# Patient Record
Sex: Male | Born: 1955 | Race: White | Hispanic: No | Smoking: Former smoker
Health system: Southern US, Community
[De-identification: ages and names within clinical notes are randomized; demographics above are authoritative.]

## PROBLEM LIST (undated history)

## (undated) DIAGNOSIS — I10 Essential (primary) hypertension: Secondary | ICD-10-CM

## (undated) DIAGNOSIS — E119 Type 2 diabetes mellitus without complications: Secondary | ICD-10-CM

## (undated) DIAGNOSIS — I2699 Other pulmonary embolism without acute cor pulmonale: Secondary | ICD-10-CM

## (undated) DIAGNOSIS — N400 Enlarged prostate without lower urinary tract symptoms: Secondary | ICD-10-CM

## (undated) HISTORY — PX: KNEE SURGERY: SHX244

## (undated) HISTORY — PX: SHOULDER SURGERY: SHX246

## (undated) HISTORY — DX: Other pulmonary embolism without acute cor pulmonale: I26.99

## (undated) HISTORY — DX: Type 2 diabetes mellitus without complications: E11.9

## (undated) HISTORY — DX: Benign prostatic hyperplasia without lower urinary tract symptoms: N40.0

---

## 2010-10-13 ENCOUNTER — Emergency Department (HOSPITAL_COMMUNITY)
Admission: EM | Admit: 2010-10-13 | Discharge: 2010-10-14 | Disposition: A | Discharge status: Home / Self Care | Payer: 59 | Attending: Emergency Medicine | Admitting: Emergency Medicine

## 2010-10-13 DIAGNOSIS — S0180XA Unspecified open wound of other part of head, initial encounter: Secondary | ICD-10-CM | POA: Insufficient documentation

## 2010-10-13 DIAGNOSIS — R05 Cough: Secondary | ICD-10-CM | POA: Insufficient documentation

## 2010-10-13 DIAGNOSIS — W19XXXA Unspecified fall, initial encounter: Secondary | ICD-10-CM | POA: Insufficient documentation

## 2010-10-13 DIAGNOSIS — E1169 Type 2 diabetes mellitus with other specified complication: Secondary | ICD-10-CM | POA: Insufficient documentation

## 2010-10-13 DIAGNOSIS — R059 Cough, unspecified: Secondary | ICD-10-CM | POA: Insufficient documentation

## 2010-11-16 ENCOUNTER — Emergency Department (HOSPITAL_COMMUNITY): Payer: 59

## 2010-11-16 ENCOUNTER — Inpatient Hospital Stay (HOSPITAL_COMMUNITY)
Admission: EM | Admit: 2010-11-16 | Discharge: 2010-11-18 | DRG: 638 | Disposition: A | Discharge status: Home / Self Care | Payer: 59 | Attending: Internal Medicine | Admitting: Internal Medicine

## 2010-11-16 DIAGNOSIS — R030 Elevated blood-pressure reading, without diagnosis of hypertension: Secondary | ICD-10-CM | POA: Diagnosis present

## 2010-11-16 DIAGNOSIS — E871 Hypo-osmolality and hyponatremia: Secondary | ICD-10-CM | POA: Diagnosis present

## 2010-11-16 DIAGNOSIS — E875 Hyperkalemia: Secondary | ICD-10-CM | POA: Diagnosis present

## 2010-11-16 DIAGNOSIS — E1310 Other specified diabetes mellitus with ketoacidosis without coma: Principal | ICD-10-CM | POA: Diagnosis present

## 2010-11-16 DIAGNOSIS — F17200 Nicotine dependence, unspecified, uncomplicated: Secondary | ICD-10-CM | POA: Diagnosis present

## 2010-11-16 DIAGNOSIS — Z794 Long term (current) use of insulin: Secondary | ICD-10-CM

## 2010-11-16 LAB — CARDIAC PANEL(CRET KIN+CKTOT+MB+TROPI)
CK, MB: 2.9 ng/mL (ref 0.3–4.0)
CK, MB: 2.5 ng/mL (ref 0.3–4.0)
Relative Index: INVALID (ref 0.0–2.5)
Relative Index: INVALID (ref 0.0–2.5)
Total CK: 28 U/L (ref 7–232)
Total CK: 22 U/L (ref 7–232)
Troponin I: <0.3 ng/mL (ref <0.30)
Troponin I: <0.3 ng/mL (ref <0.30)

## 2010-11-16 LAB — BLOOD GAS, ARTERIAL
Acid-base deficit: 10.8 mmol/L — ABNORMAL HIGH (ref 0.0–2.0)
Bicarbonate: 5.5 mEq/L — ABNORMAL LOW (ref 20.0–24.0)
Bicarbonate: 14.2 mEq/L — ABNORMAL LOW (ref 20.0–24.0)
FIO2: 0.21 %
O2 Content: 2 L/min
O2 Saturation: 98.9 %
Patient temperature: 37
TCO2: 13.1 mmol/L (ref 0–100)
pCO2 arterial: 28.9 mmHg — ABNORMAL LOW (ref 35.0–45.0)
pH, Arterial: 7.168 — CL (ref 7.350–7.450)
pH, Arterial: 7.312 — ABNORMAL LOW (ref 7.350–7.450)
pO2, Arterial: 154 mmHg — ABNORMAL HIGH (ref 80.0–100.0)
pO2, Arterial: 106 mmHg — ABNORMAL HIGH (ref 80.0–100.0)

## 2010-11-16 LAB — DIFFERENTIAL
Basophils Absolute: 0 10*3/uL (ref 0.0–0.1)
Eosinophils Absolute: 0 10*3/uL (ref 0.0–0.7)
Eosinophils Relative: 0 % (ref 0–5)
Lymphocytes Relative: 10 % — ABNORMAL LOW (ref 12–46)
Lymphs Abs: 1.4 10*3/uL (ref 0.7–4.0)
Neutrophils Relative %: 85 % — ABNORMAL HIGH (ref 43–77)

## 2010-11-16 LAB — GLUCOSE, CAPILLARY
Glucose-Capillary: 430 mg/dL — ABNORMAL HIGH (ref 70–99)
Glucose-Capillary: 418 mg/dL — ABNORMAL HIGH (ref 70–99)
Glucose-Capillary: 314 mg/dL — ABNORMAL HIGH (ref 70–99)
Glucose-Capillary: 283 mg/dL — ABNORMAL HIGH (ref 70–99)
Glucose-Capillary: 249 mg/dL — ABNORMAL HIGH (ref 70–99)
Glucose-Capillary: 227 mg/dL — ABNORMAL HIGH (ref 70–99)
Glucose-Capillary: 228 mg/dL — ABNORMAL HIGH (ref 70–99)
Glucose-Capillary: 164 mg/dL — ABNORMAL HIGH (ref 70–99)
Glucose-Capillary: 148 mg/dL — ABNORMAL HIGH (ref 70–99)
Glucose-Capillary: 118 mg/dL — ABNORMAL HIGH (ref 70–99)

## 2010-11-16 LAB — BASIC METABOLIC PANEL
BUN: 13 mg/dL (ref 6–23)
CO2: 8 mEq/L — CL (ref 19–32)
CO2: 19 mEq/L (ref 19–32)
Calcium: 8.4 mg/dL (ref 8.4–10.5)
Chloride: 85 mEq/L — ABNORMAL LOW (ref 96–112)
Creatinine, Ser: 1.22 mg/dL (ref 0.50–1.35)
GFR calc Af Amer: >60 mL/min (ref >60)
GFR calc non Af Amer: >60 mL/min (ref >60)
Glucose, Bld: 130 mg/dL — ABNORMAL HIGH (ref 70–99)
Potassium: 5.5 mEq/L — ABNORMAL HIGH (ref 3.5–5.1)
Potassium: 3.2 mEq/L — ABNORMAL LOW (ref 3.5–5.1)

## 2010-11-16 LAB — CBC
HCT: 43.2 % (ref 39.0–52.0)
MCV: 91.1 fL (ref 78.0–100.0)
Platelets: 350 10*3/uL (ref 150–400)
RBC: 4.74 MIL/uL (ref 4.22–5.81)
RDW: 12.3 % (ref 11.5–15.5)
WBC: 15 10*3/uL — ABNORMAL HIGH (ref 4.0–10.5)

## 2010-11-16 LAB — MRSA PCR SCREENING: MRSA by PCR: NEGATIVE

## 2010-11-16 LAB — URINALYSIS, ROUTINE W REFLEX MICROSCOPIC
Glucose, UA: >1000 mg/dL — AB
Hgb urine dipstick: NEGATIVE
Ketones, ur: >80 mg/dL — AB
pH: 5.5 (ref 5.0–8.0)

## 2010-11-16 LAB — MAGNESIUM: Magnesium: 1.9 mg/dL (ref 1.5–2.5)

## 2010-11-16 LAB — URINE MICROSCOPIC-ADD ON

## 2010-11-17 LAB — BLOOD GAS, ARTERIAL
Acid-base deficit: 5.7 mmol/L — ABNORMAL HIGH (ref 0.0–2.0)
Bicarbonate: 18.5 mEq/L — ABNORMAL LOW (ref 20.0–24.0)
Drawn by: 21310
O2 Saturation: 98.4 %
Patient temperature: 37
TCO2: 16.7 mmol/L (ref 0–100)
pCO2 arterial: 32.6 mmHg — ABNORMAL LOW (ref 35.0–45.0)
pH, Arterial: 7.373 (ref 7.350–7.450)
pO2, Arterial: 103 mmHg — ABNORMAL HIGH (ref 80.0–100.0)

## 2010-11-17 LAB — GLUCOSE, CAPILLARY: Glucose-Capillary: 250 mg/dL — ABNORMAL HIGH (ref 70–99)

## 2010-11-17 LAB — BASIC METABOLIC PANEL
BUN: 10 mg/dL (ref 6–23)
CO2: 21 mEq/L (ref 19–32)
Calcium: 8.5 mg/dL (ref 8.4–10.5)
Chloride: 99 mEq/L (ref 96–112)
Creatinine, Ser: 0.57 mg/dL (ref 0.50–1.35)
GFR calc Af Amer: >60 mL/min (ref >60)
GFR calc non Af Amer: >60 mL/min (ref >60)
Glucose, Bld: 181 mg/dL — ABNORMAL HIGH (ref 70–99)
Potassium: 3.5 mEq/L (ref 3.5–5.1)
Sodium: 129 mEq/L — ABNORMAL LOW (ref 135–145)

## 2010-11-17 LAB — HEMOGLOBIN A1C
Hgb A1c MFr Bld: 11.5 % — ABNORMAL HIGH (ref <5.7)
Mean Plasma Glucose: 283 mg/dL — ABNORMAL HIGH (ref <117)

## 2010-11-17 LAB — CARDIAC PANEL(CRET KIN+CKTOT+MB+TROPI)
CK, MB: 2.1 ng/mL (ref 0.3–4.0)
Relative Index: INVALID (ref 0.0–2.5)
Total CK: 20 U/L (ref 7–232)
Troponin I: <0.3 ng/mL (ref <0.30)

## 2010-11-17 LAB — TSH: TSH: 1.005 u[IU]/mL (ref 0.350–4.500)

## 2010-11-18 LAB — BASIC METABOLIC PANEL
BUN: 4 mg/dL — ABNORMAL LOW (ref 6–23)
CO2: 26 mEq/L (ref 19–32)
Calcium: 8.3 mg/dL — ABNORMAL LOW (ref 8.4–10.5)
Chloride: 99 mEq/L (ref 96–112)
Creatinine, Ser: 0.47 mg/dL — ABNORMAL LOW (ref 0.50–1.35)
GFR calc Af Amer: >60 mL/min (ref >60)
GFR calc non Af Amer: >60 mL/min (ref >60)
Glucose, Bld: 320 mg/dL — ABNORMAL HIGH (ref 70–99)
Potassium: 3.6 mEq/L (ref 3.5–5.1)
Sodium: 132 mEq/L — ABNORMAL LOW (ref 135–145)

## 2010-11-18 LAB — CBC
HCT: 33.8 % — ABNORMAL LOW (ref 39.0–52.0)
Hemoglobin: 12.1 g/dL — ABNORMAL LOW (ref 13.0–17.0)
MCH: 31.7 pg (ref 26.0–34.0)
MCHC: 35.8 g/dL (ref 30.0–36.0)
MCV: 88.5 fL (ref 78.0–100.0)
Platelets: 224 10*3/uL (ref 150–400)
RBC: 3.82 MIL/uL — ABNORMAL LOW (ref 4.22–5.81)
RDW: 12.2 % (ref 11.5–15.5)
WBC: 7.5 10*3/uL (ref 4.0–10.5)

## 2010-11-18 LAB — GLUCOSE, CAPILLARY: Glucose-Capillary: 335 mg/dL — ABNORMAL HIGH (ref 70–99)

## 2010-11-21 LAB — CULTURE, BLOOD (ROUTINE X 2)
Culture: NO GROWTH
Culture: NO GROWTH

## 2010-11-26 NOTE — H&P (Signed)
NAME:  Daniel Campos, Daniel Campos NO.:  [id number]  MEDICAL RECORD NO.:  [id number]  LOCATION:  IC04                          FACILITY:  [facility]  PHYSICIAN:  Celso Amy, MD   DATE OF BIRTH:  [birth year]  DATE OF ADMISSION:  11/16/2010 DATE OF DISCHARGE:  LH                             HISTORY & PHYSICAL   PRIMARY CARE PHYSICIAN:  The patient does not have a primary care.  The patient moved from [place] 4-5 years ago but has not got any primary care yet.  CHIEF COMPLAINT:  Sugar is not controlled.  HISTORY OF PRESENT ILLNESS:  The patient is a 55 year old white male with a past medical history of diabetes type 2 who presented to the ER with chief complaint of uncontrolled sugar.  History of present illness dates back to past Friday when the patient started having nausea and vomiting.  The patient was having nausea on regular basis.  The patient was not able to keep anything down.  The patient was started havingemesis last night.  The patient had 3-4 episodes of emesis.  The patient was not giving himself insulin and he was not eating and was scared that his glucose will go low.  The patient only injected himself once with insulin past 3-4 days.  The patient complains of increased thirst.  The patient offers no complaint of blood in emesis.  No complaint of diarrhea or abdominal pain.  The patient has no complaint of chest pain. The patient complains of shortness of breath on exertion.  No complaint of syncope or seizures.  No complaint of speech difficulty or leg or arm weakness.  No complaint of numbness.  No complaint of fever or cough. The patient does complaint of occasional chills.  The patient complains of dizziness on postural change.  ALLERGIES:  The patient has no known drug allergies.  SOCIAL HISTORY:  The patient is a smoker with more than 40-pack years of smoking.  The patient drinks alcohol occasionally.  FAMILY HISTORY:  The patient has a strong family  history of diabetes in his siblings.  PAST MEDICAL HISTORY:  Positive for diabetes mellitus type 2.  REVIEW OF SYSTEMS:  Positive for chills.  MEDICATIONS:  The patient takes Humulin 70/30, 10-12 units twice daily.  PHYSICAL EXAMINATION:  VITAL SIGNS:  Lying blood pressure 149/91, on standing up blood pressure is 87/61.  Pulse 120, respiratory rate 22, and temperature 97.4. GENERAL:  The patient is awake, alert, oriented to time, place, and person, think-built, resting comfortably on the bed. HEENT:  Pupils equally reactive to light and accommodation.  Extraocular movement is intact.  Head is atraumatic and normocephalic. NECK:  Supple. RESPIRATORY:  No acute respiratory distress.  Chest is clear to auscultation bilaterally. CARDIOVASCULAR:  S1-S2 is regular in rate and rhythm.  No murmurs were appreciated. GI:  Deep bowel sounds present.  Abdomen soft, nontender, and nondistended. EXTREMITIES:  No lower extremity edema or cyanosis was seen. CNS:  Cranial nerves II-XII are grossly intact.  The patient moves both upper and lower extremities with 5/5 strength. PSYCHIATRIC:  The patient has normal mood and affect. SKIN:  The patient has abrasions on the right  foot over the scapular region.  LABORATORY DATA:  Sodium 125, potassium 5.5, serum chloride 85, bicarb 8, BUN 22, serum creatinine 1.2, and glucose 511.  The patient's WBC is 15, the patient's hemoglobin is 15, and platelets 350.  The patient's anion gap is 125 minus 93 which comes out to be 32.  The patient's delta anion gap is 32 minus 12 which is 20.  The patient's delta bicarb is 24- 8 which is 16.  The patient expected PC2 is 20+ minus 2.  The patient's ABG shows pH of 7.16 and pCO2 of 15.  Chest x-ray shows signs of COPD. Postop EKG shows normal sinus rhythm with sinus tachycardia.  IMPRESSION: 1. Endocrinology:  The patient is being admitted with diabetic     ketoacidosis.  The patient is on IV insulin and IV  fluids. 2. Acid base:  The patient has high anion gap acidosis because of     diabetic ketoacidosis.  The patient also has respiratory alkalosis     secondary to inflammatory state. 3. Cardiovascular:  The patient is hemodynamically fragile.  The     patient is orthostatic but no need of pressors yet. 4. Fluid electrolyte nutrition:  The patient is hypovolemic. 5. The patient is hyponatremic:  Hyponatremia is because of     hyperglycemia as creatinine for high glucose.  The sodium comes to     normal. 6. Hyperkalemia:  The patient's hyperkalemia is because of lack of     insulin because of diabetic ketoacidosis. 7. Psych:  The patient has a history of tobacco abuse. 8. Deep venous thrombosis:  We will keep the patient on deep venous     thrombosis prophylaxis.  PLAN: 1. We will admit to ICU. 2. We will cycle tropes. 3. We will repeat ABG now in the a.m. to see where his pH is. 4. We will keep the patient on IV insulin. 5. We will keep the patient on IV fluids. 6. We will follow glucose stabilizer protocol. 7. The patient's further course depends on how the patient does with     this admission.     Celso Amy, MD    MB/MEDQ  D:  11/16/2010  T:  11/17/2010  Job:  [id number]  Electronically Signed by Celso Amy M.D. on 11/26/2010 01:17:07 PM

## 2010-12-02 NOTE — Discharge Summary (Signed)
NAMEMarland Kitchen  Daniel Campos, Daniel Campos NO.:  [id number]  MEDICAL RECORD NO.:  [id number]  LOCATION:  A304                          FACILITY:  [facility]  PHYSICIAN:  Peggye Pitt, M.D. DATE OF BIRTH:  09-13-1955  DATE OF ADMISSION:  11/16/2010 DATE OF DISCHARGE:  07/04/2012LH                              DISCHARGE SUMMARY   PRIMARY CARE PHYSICIAN:  He is currently unassigned.  DISCHARGE DIAGNOSES: 1. Insulin-dependent diabetes mellitus with diabetic ketoacidosis this     admission, resolved. 2. Elevated blood pressure, without diagnosis of hypertension. 3. High anion gap metabolic acidosis secondary to diabetic     ketoacidosis. 4. Hyperkalemia, resolved, secondary to acid base disturbance.  DISCHARGE MEDICATIONS:  Humulin insulin 70/30 to take 18 units in the morning before breakfast and 15 units in the evening right before dinner.  DISPOSITION AND FOLLOWUP:  Daniel Campos will be discharged home today in stable and improved condition.  We have asked Case Management to assist him with a referral to a new primary care physician in the area.  He is instructed to take his CBGs 3 times a day before each meal and to bring in his log to his next doctor's visit for insulin titration.  He is instructed to hold his next dose of insulin and to eat something high in a carbon count if his sugar is below 70.  CONSULTATION THIS HOSPITALIZATION:  None.  IMAGES AND PROCEDURES:  A chest x-ray on November 16, 2010, that showed changes of COPD with no acute findings.  HISTORY AND PHYSICAL:  For full details, please refer to dictation on November 16, 2010, by Dr. Wolfgang Phoenix, but in brief Daniel Campos is a very pleasant 55 year old gentleman who has a history of insulin-dependent diabetes mellitus who has been in the area for about 5 years.  However, he has not had any primary care followup who presented because his sugars were not controlled.  Approximately 5 days prior to admission, he started  experiencing nausea on a regular basis, was not able to eat. Because of this, he was cutting down his insulin dose as to not make his sugars become very low.  He complained of polydipsia.  In the emergency department, he was found to have a very high sugar with a high anion gap.  We were asked to admit him for further evaluation and management.  HOSPITAL COURSE BY PROBLEM: 1. Diabetic ketoacidosis.  He was initially placed in the Step-Down     Unit on IV insulin and fluids.  He was aggressively fluid     resuscitated.  He has now been taken off the insulin drip and has     been transitioned back to his home regimen of insulin 70/30.  It is     noted that he was taking 10-12 units twice daily at home.  I have     increased his 70/30 to 18 units in the morning and 15 units at     night.  He will need close followup with his primary care physician     for further insulin titration if needed.  He has been seen by     nutritionist for diabetic diet education and  will also be referred     for outpatient diabetes education. 2. Elevated blood pressure without diagnosis of hypertension.  It is     noted during his hospital stay that his blood pressure has been     between 140-160 systolic.  This is certainly above goal for a     diabetic in whom we would like to keep blood pressure underneath     130/80.  At this point, I will refrain from adding any     antihypertensive medications as I am unsure of what his baseline     blood pressure is, however, close followup in the outpatient     setting is recommended to institute antihypertensive therapy if     needed.  If he needs any antihypertensive agent, I recommend an ACE     inhibitor for his renal protective function.  VITAL SIGNS ON DAY OF DISCHARGE:  Blood pressure 146/88, heart rate 98, respirations 16, sats of 100% on room air, and temp 98.7.     Peggye Pitt, M.D.     EH/MEDQ  D:  11/18/2010  T:  11/18/2010  Job:   [id number]  Electronically Signed by Peggye Pitt M.D. on 12/02/2010 07:38:44 AM

## 2012-08-16 ENCOUNTER — Encounter: Payer: Self-pay | Admitting: Family Medicine

## 2012-08-16 ENCOUNTER — Ambulatory Visit (INDEPENDENT_AMBULATORY_CARE_PROVIDER_SITE_OTHER): Payer: 59 | Admitting: Family Medicine

## 2012-08-16 VITALS — BP 122/78 | Temp 97.6°F | Wt 143.0 lb

## 2012-08-16 DIAGNOSIS — E119 Type 2 diabetes mellitus without complications: Secondary | ICD-10-CM

## 2012-08-16 DIAGNOSIS — J209 Acute bronchitis, unspecified: Secondary | ICD-10-CM

## 2012-08-16 DIAGNOSIS — E1151 Type 2 diabetes mellitus with diabetic peripheral angiopathy without gangrene: Secondary | ICD-10-CM | POA: Insufficient documentation

## 2012-08-16 DIAGNOSIS — N400 Enlarged prostate without lower urinary tract symptoms: Secondary | ICD-10-CM

## 2012-08-16 MED ORDER — CLARITHROMYCIN 500 MG PO TABS: 500.0000 mg | ORAL_TABLET | Freq: Two times a day (BID) | ORAL | Status: AC

## 2012-08-16 NOTE — Progress Notes (Signed)
  Subjective:    Patient ID: Daniel Campos, male    DOB: 12-17-1955, 57 y.o.   MRN: [id number]  Cough The current episode started in the past 7 days. The problem has been gradually worsening. The problem occurs every few minutes. The cough is productive of sputum. Associated symptoms include ear congestion, nasal congestion, rhinorrhea and shortness of breath. Pertinent negatives include no chest pain or fever. Nothing aggravates the symptoms.  Shortness of Breath Associated symptoms include rhinorrhea. Pertinent negatives include no chest pain or fever.      Review of Systems  Constitutional: Negative for fever.  HENT: Positive for rhinorrhea.   Respiratory: Positive for cough and shortness of breath.   Cardiovascular: Negative for chest pain.       Objective:   Physical Exam  Alert hydration good. Vitals reviewed. HEENT moderate nasal congestion. Some frontal tenderness. Pharynx erythematous. Lungs rare rhonchi no true wheezes. Heart regular in rhythm.      Assessment & Plan:  Impression rhinitis bronchitis. Underlying diabetes with fair control of sugar today. Plan as per orders

## 2012-08-16 NOTE — Patient Instructions (Signed)
Take all the antibiotics 

## 2012-10-16 ENCOUNTER — Encounter: Payer: Self-pay | Admitting: Family Medicine

## 2012-10-16 ENCOUNTER — Ambulatory Visit (INDEPENDENT_AMBULATORY_CARE_PROVIDER_SITE_OTHER): Payer: 59 | Admitting: Family Medicine

## 2012-10-16 VITALS — BP 132/88 | Temp 97.7°F

## 2012-10-16 DIAGNOSIS — E119 Type 2 diabetes mellitus without complications: Secondary | ICD-10-CM

## 2012-10-16 DIAGNOSIS — J209 Acute bronchitis, unspecified: Secondary | ICD-10-CM

## 2012-10-16 DIAGNOSIS — I10 Essential (primary) hypertension: Secondary | ICD-10-CM

## 2012-10-16 MED ORDER — LEVOFLOXACIN 500 MG PO TABS: 500.0000 mg | ORAL_TABLET | Freq: Every day | ORAL | Status: AC

## 2012-10-16 NOTE — Patient Instructions (Addendum)
46 units in the morn and 24 in the eve (increase your insulin)  Call us in two-three weeks with some fasting numbers

## 2012-10-16 NOTE — Progress Notes (Signed)
  Subjective:    Patient ID: Daniel Campos, male    DOB: Oct 27, 1955, 57 y.o.   MRN: [id number]  Cough This is a recurrent problem. The current episode started 1 to 4 weeks ago. The problem has been gradually worsening. The problem occurs every few minutes. The cough is productive of purulent sputum. Associated symptoms include nasal congestion and rhinorrhea. Pertinent negatives include no chest pain, chills, ear congestion, myalgias or shortness of breath. Associated symptoms comments: Decreased energy level. Risk factors for lung disease include smoking/tobacco exposure. He has tried nothing for the symptoms. The treatment provided mild relief.  Diabetes He has type 1 diabetes mellitus. His disease course has been improving. There are no hypoglycemic associated symptoms. Pertinent negatives for diabetes include no chest pain. Symptoms are improving. Risk factors for coronary artery disease include diabetes mellitus and hypertension. He is compliant with treatment all of the time. He is following a generally healthy diet. Meal planning includes avoidance of concentrated sweets. He has not had a previous visit with a dietician. He participates in exercise daily. His home blood glucose trend is decreasing steadily. His breakfast blood glucose range is generally 110-130 mg/dl. An ACE inhibitor/angiotensin II receptor blocker is being taken. Eye exam is current.   Eye surg recently sec to blood vessel issues from diabetes  Results for orders placed in visit on 10/16/12  POCT GLYCOSYLATED HEMOGLOBIN (HGB A1C)      Result Value Range   Hemoglobin A1C 10.4      Review of Systems  Constitutional: Negative for chills.  HENT: Positive for rhinorrhea.   Respiratory: Positive for cough. Negative for shortness of breath.   Cardiovascular: Negative for chest pain.  Musculoskeletal: Negative for myalgias.   ROS otherwise negative    Objective:   Physical Exam  Alert HEENT normal. Moderate nasal  congestion. Pharynx normal neck supple. Vitals reviewed. Lungs no wheezes or crackles heart regular in rhythm. Abdomen benign. Feet pulses good sensation intact no edema.     Assessment & Plan:  Impression 1 type 2 diabetes A1c still poor. #2 hypertension good control. #3 bronchitis-discussed. Plan encouraged to stop smoking. Levaquin 500 daily for 10 days. Insulin increased C. patient instructions. Warning signs discussed. Diet exercise discussed in encourage. Recheck as scheduled. WSL

## 2012-12-06 ENCOUNTER — Other Ambulatory Visit: Payer: Self-pay

## 2012-12-06 ENCOUNTER — Inpatient Hospital Stay (HOSPITAL_COMMUNITY): Payer: 59

## 2012-12-06 ENCOUNTER — Encounter (HOSPITAL_COMMUNITY): Payer: Self-pay | Admitting: Anesthesiology

## 2012-12-06 ENCOUNTER — Emergency Department (HOSPITAL_COMMUNITY): Payer: 59

## 2012-12-06 ENCOUNTER — Inpatient Hospital Stay (HOSPITAL_COMMUNITY): Payer: 59 | Admitting: Anesthesiology

## 2012-12-06 ENCOUNTER — Encounter (HOSPITAL_COMMUNITY)
Admission: EM | Disposition: A | Discharge status: Skilled Nursing Facility | Payer: Self-pay | Source: Home / Self Care | Attending: Orthopedic Surgery

## 2012-12-06 ENCOUNTER — Inpatient Hospital Stay (HOSPITAL_COMMUNITY)
Admission: EM | Admit: 2012-12-06 | Discharge: 2012-12-09 | DRG: 481 | Disposition: A | Discharge status: Skilled Nursing Facility | Payer: 59 | Attending: Orthopedic Surgery | Admitting: Orthopedic Surgery

## 2012-12-06 ENCOUNTER — Encounter (HOSPITAL_COMMUNITY): Payer: Self-pay | Admitting: Emergency Medicine

## 2012-12-06 DIAGNOSIS — W19XXXA Unspecified fall, initial encounter: Secondary | ICD-10-CM | POA: Diagnosis present

## 2012-12-06 DIAGNOSIS — Z8249 Family history of ischemic heart disease and other diseases of the circulatory system: Secondary | ICD-10-CM

## 2012-12-06 DIAGNOSIS — S7291XA Unspecified fracture of right femur, initial encounter for closed fracture: Secondary | ICD-10-CM

## 2012-12-06 DIAGNOSIS — S42301A Unspecified fracture of shaft of humerus, right arm, initial encounter for closed fracture: Secondary | ICD-10-CM

## 2012-12-06 DIAGNOSIS — F17200 Nicotine dependence, unspecified, uncomplicated: Secondary | ICD-10-CM | POA: Diagnosis present

## 2012-12-06 DIAGNOSIS — Z794 Long term (current) use of insulin: Secondary | ICD-10-CM

## 2012-12-06 DIAGNOSIS — S42213A Unspecified displaced fracture of surgical neck of unspecified humerus, initial encounter for closed fracture: Secondary | ICD-10-CM | POA: Diagnosis present

## 2012-12-06 DIAGNOSIS — S72143A Displaced intertrochanteric fracture of unspecified femur, initial encounter for closed fracture: Principal | ICD-10-CM | POA: Diagnosis present

## 2012-12-06 DIAGNOSIS — N400 Enlarged prostate without lower urinary tract symptoms: Secondary | ICD-10-CM | POA: Diagnosis present

## 2012-12-06 DIAGNOSIS — E119 Type 2 diabetes mellitus without complications: Secondary | ICD-10-CM | POA: Diagnosis present

## 2012-12-06 DIAGNOSIS — S42309A Unspecified fracture of shaft of humerus, unspecified arm, initial encounter for closed fracture: Secondary | ICD-10-CM

## 2012-12-06 DIAGNOSIS — I10 Essential (primary) hypertension: Secondary | ICD-10-CM | POA: Diagnosis present

## 2012-12-06 DIAGNOSIS — Y92009 Unspecified place in unspecified non-institutional (private) residence as the place of occurrence of the external cause: Secondary | ICD-10-CM

## 2012-12-06 DIAGNOSIS — S7290XA Unspecified fracture of unspecified femur, initial encounter for closed fracture: Secondary | ICD-10-CM

## 2012-12-06 HISTORY — PX: INTRAMEDULLARY (IM) NAIL INTERTROCHANTERIC: SHX5875

## 2012-12-06 HISTORY — DX: Essential (primary) hypertension: I10

## 2012-12-06 LAB — BASIC METABOLIC PANEL
BUN: 14 mg/dL (ref 6–23)
Creatinine, Ser: 0.63 mg/dL (ref 0.50–1.35)
GFR calc non Af Amer: >90 mL/min (ref >90)
Glucose, Bld: 177 mg/dL — ABNORMAL HIGH (ref 70–99)
Potassium: 4.8 mEq/L (ref 3.5–5.1)

## 2012-12-06 LAB — CBC WITH DIFFERENTIAL/PLATELET
Basophils Relative: 0 % (ref 0–1)
Eosinophils Absolute: 0.1 10*3/uL (ref 0.0–0.7)
Eosinophils Relative: 1 % (ref 0–5)
HCT: 30.1 % — ABNORMAL LOW (ref 39.0–52.0)
Hemoglobin: 10.6 g/dL — ABNORMAL LOW (ref 13.0–17.0)
Lymphs Abs: 2.1 10*3/uL (ref 0.7–4.0)
MCH: 30.3 pg (ref 26.0–34.0)
MCHC: 35.2 g/dL (ref 30.0–36.0)
MCV: 86 fL (ref 78.0–100.0)
Monocytes Absolute: 0.7 10*3/uL (ref 0.1–1.0)
Monocytes Relative: 5 % (ref 3–12)
Neutrophils Relative %: 79 % — ABNORMAL HIGH (ref 43–77)
RBC: 3.5 MIL/uL — ABNORMAL LOW (ref 4.22–5.81)

## 2012-12-06 LAB — GLUCOSE, CAPILLARY
Glucose-Capillary: 211 mg/dL — ABNORMAL HIGH (ref 70–99)
Glucose-Capillary: 260 mg/dL — ABNORMAL HIGH (ref 70–99)
Glucose-Capillary: 251 mg/dL — ABNORMAL HIGH (ref 70–99)
Glucose-Capillary: 227 mg/dL — ABNORMAL HIGH (ref 70–99)

## 2012-12-06 LAB — HEMOGLOBIN AND HEMATOCRIT, BLOOD
HCT: 25.8 % — ABNORMAL LOW (ref 39.0–52.0)
Hemoglobin: 9.2 g/dL — ABNORMAL LOW (ref 13.0–17.0)

## 2012-12-06 SURGERY — FIXATION, FRACTURE, INTERTROCHANTERIC, WITH INTRAMEDULLARY ROD
Anesthesia: Spinal | Site: Hip | Laterality: Right | Wound class: Clean

## 2012-12-06 MED ORDER — VERAPAMIL HCL ER 180 MG PO TBCR
180.0000 mg | EXTENDED_RELEASE_TABLET | Freq: Every day | ORAL | Status: DC
  Administered 2012-12-06 – 2012-12-08 (×3): 180 mg via ORAL
  Filled 2012-12-06 (×3): qty 1

## 2012-12-06 MED ORDER — MIDAZOLAM HCL 2 MG/2ML IJ SOLN
INTRAMUSCULAR | Status: AC
  Filled 2012-12-06: qty 2

## 2012-12-06 MED ORDER — SODIUM CHLORIDE 0.9 % IR SOLN
Status: DC | PRN
  Administered 2012-12-06: 1000 mL

## 2012-12-06 MED ORDER — FENTANYL CITRATE 0.05 MG/ML IJ SOLN
INTRAMUSCULAR | Status: AC
  Filled 2012-12-06: qty 2

## 2012-12-06 MED ORDER — METHOCARBAMOL 500 MG PO TABS
500.0000 mg | ORAL_TABLET | Freq: Four times a day (QID) | ORAL | Status: DC | PRN
  Administered 2012-12-06 – 2012-12-07 (×2): 500 mg via ORAL
  Filled 2012-12-06 (×2): qty 1

## 2012-12-06 MED ORDER — ACETAMINOPHEN 650 MG RE SUPP: 650.0000 mg | Freq: Four times a day (QID) | RECTAL | Status: DC | PRN

## 2012-12-06 MED ORDER — EPHEDRINE SULFATE 50 MG/ML IJ SOLN
INTRAMUSCULAR | Status: AC
  Filled 2012-12-06: qty 1

## 2012-12-06 MED ORDER — SENNOSIDES-DOCUSATE SODIUM 8.6-50 MG PO TABS: 1.0000 | ORAL_TABLET | Freq: Every evening | ORAL | Status: DC | PRN

## 2012-12-06 MED ORDER — BUPIVACAINE-EPINEPHRINE PF 0.5-1:200000 % IJ SOLN
INTRAMUSCULAR | Status: DC | PRN
  Administered 2012-12-06: 60 mL

## 2012-12-06 MED ORDER — ONDANSETRON HCL 4 MG/2ML IJ SOLN: 4.0000 mg | Freq: Four times a day (QID) | INTRAMUSCULAR | Status: DC | PRN

## 2012-12-06 MED ORDER — SENNA 8.6 MG PO TABS
1.0000 | ORAL_TABLET | Freq: Two times a day (BID) | ORAL | Status: DC
Start: 2012-12-06 — End: 2012-12-09
  Administered 2012-12-06 – 2012-12-09 (×6): 8.6 mg via ORAL
  Filled 2012-12-06 (×6): qty 1

## 2012-12-06 MED ORDER — INSULIN ASPART PROT & ASPART (70-30 MIX) 100 UNIT/ML ~~LOC~~ SUSP
40.0000 [IU] | Freq: Every day | SUBCUTANEOUS | Status: DC
  Filled 2012-12-06: qty 10

## 2012-12-06 MED ORDER — CHLORHEXIDINE GLUCONATE 4 % EX LIQD
60.0000 mL | Freq: Once | CUTANEOUS | Status: AC
  Administered 2012-12-06: 4 via TOPICAL
  Filled 2012-12-06 (×2): qty 60

## 2012-12-06 MED ORDER — BUPIVACAINE-EPINEPHRINE PF 0.5-1:200000 % IJ SOLN
INTRAMUSCULAR | Status: AC
  Filled 2012-12-06: qty 10

## 2012-12-06 MED ORDER — FENTANYL CITRATE 0.05 MG/ML IJ SOLN
INTRAMUSCULAR | Status: DC | PRN
  Administered 2012-12-06: 25 ug via INTRATHECAL

## 2012-12-06 MED ORDER — ACETAMINOPHEN 325 MG PO TABS: 650.0000 mg | ORAL_TABLET | Freq: Four times a day (QID) | ORAL | Status: DC | PRN

## 2012-12-06 MED ORDER — MENTHOL 3 MG MT LOZG: 1.0000 | LOZENGE | OROMUCOSAL | Status: DC | PRN

## 2012-12-06 MED ORDER — METHOCARBAMOL 100 MG/ML IJ SOLN
500.0000 mg | Freq: Once | INTRAVENOUS | Status: AC
  Administered 2012-12-06: 500 mg via INTRAVENOUS
  Filled 2012-12-06: qty 5

## 2012-12-06 MED ORDER — INSULIN ASPART PROT & ASPART (70-30 MIX) 100 UNIT/ML ~~LOC~~ SUSP
20.0000 [IU] | Freq: Every day | SUBCUTANEOUS | Status: DC
  Administered 2012-12-06: 20 [IU] via SUBCUTANEOUS
  Filled 2012-12-06 (×2): qty 10

## 2012-12-06 MED ORDER — PROPOFOL 10 MG/ML IV EMUL
INTRAVENOUS | Status: AC
  Filled 2012-12-06: qty 20

## 2012-12-06 MED ORDER — ENALAPRIL MALEATE 5 MG PO TABS
10.0000 mg | ORAL_TABLET | Freq: Every day | ORAL | Status: DC
  Administered 2012-12-07 – 2012-12-09 (×3): 10 mg via ORAL
  Filled 2012-12-06 (×3): qty 2

## 2012-12-06 MED ORDER — MORPHINE SULFATE 4 MG/ML IJ SOLN
4.0000 mg | INTRAMUSCULAR | Status: DC | PRN
  Administered 2012-12-06: 4 mg via INTRAVENOUS
  Filled 2012-12-06: qty 1

## 2012-12-06 MED ORDER — INSULIN ASPART PROT & ASPART (70-30 MIX) 100 UNIT/ML ~~LOC~~ SUSP
40.0000 [IU] | Freq: Two times a day (BID) | SUBCUTANEOUS | Status: DC
  Administered 2012-12-07: 40 [IU] via SUBCUTANEOUS
  Filled 2012-12-06: qty 10

## 2012-12-06 MED ORDER — LIDOCAINE HCL (PF) 1 % IJ SOLN
INTRAMUSCULAR | Status: AC
  Filled 2012-12-06: qty 5

## 2012-12-06 MED ORDER — HYDROCODONE-ACETAMINOPHEN 5-325 MG PO TABS
1.0000 | ORAL_TABLET | Freq: Once | ORAL | Status: AC
  Administered 2012-12-06: 1 via ORAL

## 2012-12-06 MED ORDER — FENTANYL CITRATE 0.05 MG/ML IJ SOLN: 25.0000 ug | INTRAMUSCULAR | Status: DC | PRN

## 2012-12-06 MED ORDER — CEFAZOLIN SODIUM-DEXTROSE 2-3 GM-% IV SOLR
2.0000 g | Freq: Four times a day (QID) | INTRAVENOUS | Status: AC
  Administered 2012-12-06 – 2012-12-07 (×2): 2 g via INTRAVENOUS
  Filled 2012-12-06 (×2): qty 50

## 2012-12-06 MED ORDER — ONDANSETRON HCL 4 MG/2ML IJ SOLN
4.0000 mg | Freq: Once | INTRAMUSCULAR | Status: AC
  Administered 2012-12-06: 4 mg via INTRAVENOUS

## 2012-12-06 MED ORDER — FENTANYL CITRATE 0.05 MG/ML IJ SOLN
INTRAMUSCULAR | Status: DC | PRN
  Administered 2012-12-06 (×2): 25 ug via INTRAVENOUS

## 2012-12-06 MED ORDER — ONDANSETRON HCL 4 MG/2ML IJ SOLN
INTRAMUSCULAR | Status: AC
  Filled 2012-12-06: qty 2

## 2012-12-06 MED ORDER — CEFAZOLIN SODIUM-DEXTROSE 2-3 GM-% IV SOLR
INTRAVENOUS | Status: AC
  Filled 2012-12-06: qty 50

## 2012-12-06 MED ORDER — SODIUM CHLORIDE 0.9 % IV SOLN
INTRAVENOUS | Status: AC
  Administered 2012-12-06 (×2): via INTRAVENOUS

## 2012-12-06 MED ORDER — ONDANSETRON HCL 4 MG PO TABS: 4.0000 mg | ORAL_TABLET | Freq: Four times a day (QID) | ORAL | Status: DC | PRN

## 2012-12-06 MED ORDER — ONDANSETRON HCL 4 MG/2ML IJ SOLN
4.0000 mg | Freq: Once | INTRAMUSCULAR | Status: AC
  Administered 2012-12-06: 4 mg via INTRAVENOUS
  Filled 2012-12-06: qty 2

## 2012-12-06 MED ORDER — HYDROCODONE-ACETAMINOPHEN 5-325 MG PO TABS
1.0000 | ORAL_TABLET | ORAL | Status: DC
  Administered 2012-12-07 (×2): 1 via ORAL
  Filled 2012-12-06 (×2): qty 1

## 2012-12-06 MED ORDER — MIDAZOLAM HCL 5 MG/5ML IJ SOLN
INTRAMUSCULAR | Status: DC | PRN
  Administered 2012-12-06 (×2): 1 mg via INTRAVENOUS

## 2012-12-06 MED ORDER — CEFAZOLIN SODIUM-DEXTROSE 2-3 GM-% IV SOLR
2.0000 g | INTRAVENOUS | Status: AC
  Administered 2012-12-06: 2 g via INTRAVENOUS
  Filled 2012-12-06: qty 50

## 2012-12-06 MED ORDER — PHENOL 1.4 % MT LIQD: 1.0000 | OROMUCOSAL | Status: DC | PRN

## 2012-12-06 MED ORDER — LACTATED RINGERS IV SOLN
INTRAVENOUS | Status: DC
Start: 2012-12-06 — End: 2012-12-06
  Administered 2012-12-06 (×2): via INTRAVENOUS

## 2012-12-06 MED ORDER — FENTANYL CITRATE 0.05 MG/ML IJ SOLN
25.0000 ug | INTRAMUSCULAR | Status: AC
  Administered 2012-12-06 (×2): 25 ug via INTRAVENOUS

## 2012-12-06 MED ORDER — MORPHINE SULFATE 4 MG/ML IJ SOLN
2.0000 mg | INTRAMUSCULAR | Status: DC | PRN
  Administered 2012-12-06: 2 mg via INTRAVENOUS
  Filled 2012-12-06: qty 1

## 2012-12-06 MED ORDER — MORPHINE SULFATE 2 MG/ML IJ SOLN
0.5000 mg | INTRAMUSCULAR | Status: DC | PRN
  Administered 2012-12-06 (×2): 0.5 mg via INTRAVENOUS
  Filled 2012-12-06 (×2): qty 1

## 2012-12-06 MED ORDER — MIDAZOLAM HCL 2 MG/2ML IJ SOLN
1.0000 mg | INTRAMUSCULAR | Status: DC | PRN
  Administered 2012-12-06 (×2): 1 mg via INTRAVENOUS

## 2012-12-06 MED ORDER — MORPHINE SULFATE 4 MG/ML IJ SOLN
2.0000 mg | Freq: Once | INTRAMUSCULAR | Status: AC
  Administered 2012-12-06: 2 mg via INTRAVENOUS
  Filled 2012-12-06: qty 1

## 2012-12-06 MED ORDER — BUPIVACAINE IN DEXTROSE 0.75-8.25 % IT SOLN
INTRATHECAL | Status: DC | PRN
  Administered 2012-12-06: 15 mg via INTRATHECAL

## 2012-12-06 MED ORDER — METOCLOPRAMIDE HCL 10 MG PO TABS: 5.0000 mg | ORAL_TABLET | Freq: Three times a day (TID) | ORAL | Status: DC | PRN

## 2012-12-06 MED ORDER — METOCLOPRAMIDE HCL 5 MG/ML IJ SOLN: 5.0000 mg | Freq: Three times a day (TID) | INTRAMUSCULAR | Status: DC | PRN

## 2012-12-06 MED ORDER — EPHEDRINE SULFATE 50 MG/ML IJ SOLN
INTRAMUSCULAR | Status: DC | PRN
  Administered 2012-12-06 (×2): 10 mg via INTRAVENOUS

## 2012-12-06 MED ORDER — HYDROCODONE-ACETAMINOPHEN 5-325 MG PO TABS: 1.0000 | ORAL_TABLET | Freq: Four times a day (QID) | ORAL | Status: DC | PRN

## 2012-12-06 MED ORDER — SODIUM CHLORIDE 0.9 % IV SOLN
INTRAVENOUS | Status: DC
  Administered 2012-12-06 – 2012-12-07 (×2): via INTRAVENOUS
  Administered 2012-12-08: 1 mL via INTRAVENOUS

## 2012-12-06 MED ORDER — MAGNESIUM CITRATE PO SOLN: 1.0000 | Freq: Once | ORAL | Status: AC | PRN

## 2012-12-06 MED ORDER — BUPIVACAINE IN DEXTROSE 0.75-8.25 % IT SOLN
INTRATHECAL | Status: AC
  Filled 2012-12-06: qty 2

## 2012-12-06 MED ORDER — ENOXAPARIN SODIUM 40 MG/0.4ML ~~LOC~~ SOLN
40.0000 mg | SUBCUTANEOUS | Status: DC
  Administered 2012-12-08 – 2012-12-09 (×2): 40 mg via SUBCUTANEOUS
  Filled 2012-12-06 (×2): qty 0.4

## 2012-12-06 MED ORDER — PROPOFOL INFUSION 10 MG/ML OPTIME
INTRAVENOUS | Status: DC | PRN
  Administered 2012-12-06: 70 ug/kg/min via INTRAVENOUS
  Administered 2012-12-06: 25 ug/kg/min via INTRAVENOUS

## 2012-12-06 MED ORDER — METHOCARBAMOL 100 MG/ML IJ SOLN
500.0000 mg | Freq: Four times a day (QID) | INTRAVENOUS | Status: DC | PRN
  Filled 2012-12-06: qty 5

## 2012-12-06 MED ORDER — ONDANSETRON HCL 4 MG/2ML IJ SOLN: 4.0000 mg | Freq: Once | INTRAMUSCULAR | Status: DC | PRN

## 2012-12-06 MED ORDER — ENALAPRIL MALEATE 5 MG PO TABS: 20.0000 mg | ORAL_TABLET | Freq: Every day | ORAL | Status: DC

## 2012-12-06 MED ORDER — DOCUSATE SODIUM 100 MG PO CAPS
100.0000 mg | ORAL_CAPSULE | Freq: Two times a day (BID) | ORAL | Status: DC
  Administered 2012-12-06 – 2012-12-09 (×6): 100 mg via ORAL
  Filled 2012-12-06 (×6): qty 1

## 2012-12-06 MED ORDER — BISACODYL 5 MG PO TBEC: 5.0000 mg | DELAYED_RELEASE_TABLET | Freq: Every day | ORAL | Status: DC | PRN

## 2012-12-06 MED ORDER — HYDROCODONE-ACETAMINOPHEN 5-325 MG PO TABS
ORAL_TABLET | ORAL | Status: AC
  Filled 2012-12-06: qty 1

## 2012-12-06 SURGICAL SUPPLY — 52 items
BAG HAMPER (MISCELLANEOUS) ×2 IMPLANT
BANDAGE GAUZE ELAST BULKY 4 IN (GAUZE/BANDAGES/DRESSINGS) ×2 IMPLANT
BIT DRILL AO GAMMA 4.2X300 (BIT) ×2 IMPLANT
BLADE HEX COATED 2.75 (ELECTRODE) ×2 IMPLANT
BLADE SURG SZ10 CARB STEEL (BLADE) ×4 IMPLANT
CHLORAPREP W/TINT 26ML (MISCELLANEOUS) ×2 IMPLANT
CLOTH BEACON ORANGE TIMEOUT ST (SAFETY) ×2 IMPLANT
COVER LIGHT HANDLE STERIS (MISCELLANEOUS) ×4 IMPLANT
COVER MAYO STAND XLG (DRAPE) ×2 IMPLANT
DECANTER SPIKE VIAL GLASS SM (MISCELLANEOUS) ×2 IMPLANT
DRAPE STERI IOBAN 125X83 (DRAPES) ×2 IMPLANT
DRSG MEPILEX BORDER 4X12 (GAUZE/BANDAGES/DRESSINGS) ×2 IMPLANT
ELECT CAUTERY BLADE 6.4 (BLADE) ×2 IMPLANT
ELECT REM PT RETURN 9FT ADLT (ELECTROSURGICAL) ×2
ELECTRODE REM PT RTRN 9FT ADLT (ELECTROSURGICAL) ×1 IMPLANT
GLOVE BIO SURGEON STRL SZ7 (GLOVE) ×2 IMPLANT
GLOVE ECLIPSE 6.5 STRL STRAW (GLOVE) ×2 IMPLANT
GLOVE INDICATOR 7.0 STRL GRN (GLOVE) ×4 IMPLANT
GLOVE SKINSENSE NS SZ6.5 (GLOVE) ×1
GLOVE SKINSENSE NS SZ8.0 LF (GLOVE) ×1
GLOVE SKINSENSE STRL SZ6.5 (GLOVE) ×1 IMPLANT
GLOVE SKINSENSE STRL SZ8.0 LF (GLOVE) ×1 IMPLANT
GLOVE SS N UNI LF 8.5 STRL (GLOVE) ×2 IMPLANT
GOWN STRL REIN XL XLG (GOWN DISPOSABLE) ×6 IMPLANT
GUIDEROD T2 3X1000 (ROD) ×2 IMPLANT
INST SET MAJOR BONE (KITS) ×2 IMPLANT
K-WIRE  3.2X450M STR (WIRE) ×1
K-WIRE 3.2X450M STR (WIRE) ×1
KIT BLADEGUARD II DBL (SET/KITS/TRAYS/PACK) ×2 IMPLANT
KIT ROOM TURNOVER AP CYSTO (KITS) ×2 IMPLANT
KWIRE 3.2X450M STR (WIRE) ×1 IMPLANT
MANIFOLD NEPTUNE II (INSTRUMENTS) ×2 IMPLANT
MARKER SKIN DUAL TIP RULER LAB (MISCELLANEOUS) ×2 IMPLANT
NAIL TROCH GAMMA 11X18 (Nail) ×2 IMPLANT
NEEDLE HYPO 21X1.5 SAFETY (NEEDLE) ×2 IMPLANT
NEEDLE SPNL 18GX3.5 QUINCKE PK (NEEDLE) ×2 IMPLANT
NS IRRIG 1000ML POUR BTL (IV SOLUTION) ×2 IMPLANT
PACK BASIC III (CUSTOM PROCEDURE TRAY) ×1
PACK SRG BSC III STRL LF ECLPS (CUSTOM PROCEDURE TRAY) ×1 IMPLANT
PENCIL HANDSWITCHING (ELECTRODE) ×2 IMPLANT
SCREW LAG GAMMA 3 TI 10.5X105M (Screw) ×2 IMPLANT
SCREW LOCKING T2 F/T  5X37.5MM (Screw) ×1 IMPLANT
SCREW LOCKING T2 F/T 5X37.5MM (Screw) ×1 IMPLANT
SET BASIN LINEN APH (SET/KITS/TRAYS/PACK) ×2 IMPLANT
SPONGE LAP 18X18 X RAY DECT (DISPOSABLE) ×4 IMPLANT
STAPLER VISISTAT 35W (STAPLE) ×2 IMPLANT
SUT MNCRL 0 VIOLET CTX 36 (SUTURE) ×1 IMPLANT
SUT MON AB 2-0 CT1 36 (SUTURE) ×2 IMPLANT
SUT MONOCRYL 0 CTX 36 (SUTURE) ×1
SYR 30ML LL (SYRINGE) ×2 IMPLANT
SYR BULB IRRIGATION 50ML (SYRINGE) ×4 IMPLANT
YANKAUER SUCT 12FT TUBE ARGYLE (SUCTIONS) ×2 IMPLANT

## 2012-12-06 NOTE — Anesthesia Procedure Notes (Addendum)
Date/Time: 12/06/2012 1:31 PM Performed by: Despina Hidden Pre-anesthesia Checklist: Timeout performed, Patient identified, Emergency Drugs available, Suction available and Patient being monitored Oxygen Delivery Method: Non-rebreather mask    Spinal   Spinal  Patient location during procedure: OR Start time: 12/06/2012 1:36 PM Staffing CRNA/Resident: Daenerys Buttram J Preanesthetic Checklist Completed: patient identified, site marked, surgical consent, pre-op evaluation, timeout performed, IV checked, risks and benefits discussed and monitors and equipment checked Spinal Block Patient position: right lateral decubitus Prep: Betadine Patient monitoring: blood pressure, continuous pulse ox, cardiac monitor and heart rate Approach: midline Location: L2-3 Injection technique: single-shot Needle Needle type: Spinocan  Needle gauge: 22 G Assessment Sensory level: T8 Additional Notes CSF brisk and clean..................[id number]    04/2013

## 2012-12-06 NOTE — Op Note (Signed)
12/06/2012  3:08 PM  PATIENT:  Daniel Campos  57 y.o. male  PRE-OPERATIVE DIAGNOSIS:  right hip fracture, intertrochanteric   POST-OPERATIVE DIAGNOSIS:  right hip fracture, same   PROCEDURE:  Procedure(s) with comments: INTRAMEDULLARY (IM) NAIL INTERTROCHANTRIC (Right) - after lunch   Gamma nail 125 short w/ 105 lag screw and 37.5 diatl locking screw, acorn proximal   Findings: Peritrochanteric fracture   After site marking and chart update the patient was taken to the operating room where he received spinal anesthesia. he was placed on the fracture table with padding  RIGHT LEG TRACTION AND INTERNAL ROTATION was performed under C-arm guidance until a closed stable reduction was obtained. The leg was then prepped and draped sterilely. After completing the timeout a lateral incision was made over the greater trochanter extended proximally. Subcutaneous tissues were divided and the subcutaneous hemorrhage was controlled with electrocautery. Further dissection was carried out to the fascia which was split in line with the skin incision and then blunt dissection was carried out in the abductor musculature into the greater trochanter was palpated. A curved awl was passed into the greater trochanter into the femoral canal. This was confirmed by x-ray. A guidewire was placed into the cannulated awl and passed to the knee and confirmed by x-ray. Using manufacturer's recommended technique reaming was performed over the guidewire to the level of the lesser trochanter. A 125 nail was then passed over a guidewire  and passed easily. A lateral incision was made in preparation for the lag screw. Using the cannula and perforating drill the guidewire was placed across the fracture site into the femoral head. X-rays confirmed position. The pin was then measured. A105 mm setting was placed on the reamer and the reamer was passed over the guidewire under C-arm guidance. The  lag screw was placed. The acorn was  then placed proximally. The acorn was placed in a dynamic mode. The acorn was confirmed to be engaged per manufacture a technique.  Traction was then released.  We then made a second stab wound laterally past the locking bolt cannula drilled across the nail and passed a 37.5 mm locking bolt.  Wounds were then irrigated radiographs confirmed position of the fracture and of the implant.  Layered closure was performed proximally with 0 Monocryl and 2-0 Monocryl followed by reapproximation of skin edges with staples  The 2 distal incisions were closed with staples.  A total of 60 cc of Marcaine was injected into the wound area with 30 cc injected beneath the fascial layer and 30 cc in the subcutaneous layer.  Sterile dressing was applied  The patient was taken to recovery room in stable condition  Postop plan is for full weightbearing.    SURGEON:  Surgeon(s) and Role:    * Vickki Hearing, MD - Primary  PHYSICIAN ASSISTANT:   ASSISTANTS: [email or web address]   ANESTHESIA:   spinal  EBL:  Total I/O In: 1686.3 [I.V.:1686.3] Out: 1000 [Urine:1000]  BLOOD ADMINISTERED:none  DRAINS: none   LOCAL MEDICATIONS USED:  MARCAINE    and Amount: 60 ml  SPECIMEN:  No Specimen  DISPOSITION OF SPECIMEN:  N/A  COUNTS:  YES  TOURNIQUET:  * No tourniquets in log *  DICTATION: .Dragon Dictation  PLAN OF CARE: Admit to inpatient   PATIENT DISPOSITION:  PACU - hemodynamically stable.   Delay start of Pharmacological VTE agent (>24hrs) due to surgical blood loss or risk of bleeding: yes

## 2012-12-06 NOTE — ED Notes (Addendum)
Patient reports getting up at approximately 0000 tonight. States felt as if blood sugar was dropping. Reports fell and passed out. Complaining of pain to right shoulder and right hip. Deformity noted to right shoulder. Patient states drank orange juice and used oral glucose for symptoms of hypoglycemia at home. EMS administered 4 mg of zofran and 10 mg of morphine IV pta.

## 2012-12-06 NOTE — ED Notes (Signed)
Oxygen saturation ranging between 88-94% on room air. Patient placed on 2 liters of oxygen via nasal canula. Oxygen saturation now up to 97%.

## 2012-12-06 NOTE — Clinical Social Work Psychosocial (Signed)
    Clinical Social Work Department BRIEF PSYCHOSOCIAL ASSESSMENT 12/06/2012  Patient:  Daniel Campos, Daniel Campos     Account Number:  [id number]     Admit date:  12/06/2012  Clinical Social Worker:  Santa Genera, CLINICAL SOCIAL WORKER  Date/Time:  12/06/2012 10:30 AM  Referred by:  CSW  Date Referred:  12/06/2012 Referred for  SNF Placement   Other Referral:   Interview type:  Patient Other interview type:    PSYCHOSOCIAL DATA Living Status:  FAMILY Admitted from facility:   Level of care:   Primary support name:  Scot Jun Primary support relationship to patient:  SPOUSE Degree of support available:   Lives w spouse at home.  Unclear whether he will his needs can be met at discharge post surgery.    CURRENT CONCERNS Current Concerns  Post-Acute Placement   Other Concerns:    SOCIAL WORK ASSESSMENT / PLAN CSW met w patient briefly before surgery, patient oriented x4 and states he is in significant pain.  Surgery to repair humerus and hip fractures scheduled for today. Explained to patient that PT will evaluate post surgery and recommend level of care anticipated at discharge.  Patient gave permission for CSW to speak w wife and to contact insurance company to investigate SNF benefits if placement is needed. CSW will contact patient and wife post surgery when discharge needs are clarified.   Assessment/plan status:  Psychosocial Support/Ongoing Assessment of Needs Other assessment/ plan:   Information/referral to community resources:   SNF list    PATIENT'S/FAMILY'S RESPONSE TO PLAN OF CARE: Patient willing for CSW to contact wife and insurance company re SNF benefits      Santa Genera, LCSW Clinical Social Worker [phone])

## 2012-12-06 NOTE — Anesthesia Preprocedure Evaluation (Addendum)
Anesthesia Evaluation  Patient identified by MRN, date of birth, ID band Patient awake    Reviewed: Allergy & Precautions, H&P , NPO status , Patient's Chart, lab work & pertinent test results  Airway Mallampati: II TM Distance: >3 FB     Dental  (+) Teeth Intact and Poor Dentition   Pulmonary neg pulmonary ROS, COPD (recent bronchitis, am cough)Current Smoker,    Pulmonary exam normal       Cardiovascular hypertension, Pt. on medications Rhythm:Regular Rate:Normal     Neuro/Psych    GI/Hepatic   Endo/Other  diabetes, Type 2, Insulin Dependent  Renal/GU      Musculoskeletal   Abdominal   Peds  Hematology   Anesthesia Other Findings R humerus Fx - splint  Reproductive/Obstetrics                          Anesthesia Physical Anesthesia Plan  ASA: III  Anesthesia Plan: Spinal   Post-op Pain Management:    Induction:   Airway Management Planned: Nasal Cannula  Additional Equipment:   Intra-op Plan:   Post-operative Plan:   Informed Consent: I have reviewed the patients History and Physical, chart, labs and discussed the procedure including the risks, benefits and alternatives for the proposed anesthesia with the patient or authorized representative who has indicated his/her understanding and acceptance.     Plan Discussed with:   Anesthesia Plan Comments:         Anesthesia Quick Evaluation

## 2012-12-06 NOTE — Addendum Note (Signed)
Addendum created 12/06/12 1521 by Despina Hidden, CRNA   Modules edited: Anesthesia Flowsheet

## 2012-12-06 NOTE — H&P (Signed)
Daniel Campos is an 57 y.o. male.   Chief Complaint: RIGHT HIP AND SHOULDER PAIN  HPI: 57 YO MALE FELT SUGAR WAS LOW, WENT TO GET OJ, THEN FELL. COULD NOT AMBULATE. EMS CALLED , HE WAS BROUGHT TO [facility]. XRAYS WERE DONE, HIS RT HIP AND RT HUMERUS ARE FRACTURED he HAS SEVERE NON RADIATING PAIN IN BOTH AREAS.  Past Medical History  Diagnosis Date  . Diabetes mellitus without complication   . Prostate hypertrophy   . Hypertension     Past Surgical History  Procedure Laterality Date  . Knee surgery      Family History  Problem Relation Age of Onset  . Heart attack Mother    Social History:  reports that he has been smoking.  He does not have any smokeless tobacco history on file. He reports that he does not drink alcohol or use illicit drugs.  Allergies: No Known Allergies  Medications Prior to Admission  Medication Sig Dispense Refill  . aspirin 81 MG tablet Take 81 mg by mouth daily.      . enalapril (VASOTEC) 10 MG tablet Take 20 mg by mouth daily.       . insulin NPH-regular (NOVOLIN 70/30) (70-30) 100 UNIT/ML injection Inject into the skin. 40 units in am  20 units in pm      . verapamil (COVERA HS) 180 MG (CO) 24 hr tablet Take 180 mg by mouth at bedtime.        Results for orders placed during the hospital encounter of 12/06/12 (from the past 48 hour(s))  GLUCOSE, CAPILLARY     Status: Abnormal   Collection Time    12/06/12  3:13 AM      Result Value Range   Glucose-Capillary 138 (*) 70 - 99 mg/dL  CBC WITH DIFFERENTIAL     Status: Abnormal   Collection Time    12/06/12  5:50 AM      Result Value Range   WBC 13.6 (*) 4.0 - 10.5 K/uL   RBC 3.50 (*) 4.22 - 5.81 MIL/uL   Hemoglobin 10.6 (*) 13.0 - 17.0 g/dL   HCT 29.5 (*) 28.4 - 13.2 %   MCV 86.0  78.0 - 100.0 fL   MCH 30.3  26.0 - 34.0 pg   MCHC 35.2  30.0 - 36.0 g/dL   RDW 44.0  10.2 - 72.5 %   Platelets 257  150 - 400 K/uL   Neutrophils Relative % 79 (*) 43 - 77 %   Neutro Abs 10.7 (*) 1.7 - 7.7 K/uL   Lymphocytes Relative 15  12 - 46 %   Lymphs Abs 2.1  0.7 - 4.0 K/uL   Monocytes Relative 5  3 - 12 %   Monocytes Absolute 0.7  0.1 - 1.0 K/uL   Eosinophils Relative 1  0 - 5 %   Eosinophils Absolute 0.1  0.0 - 0.7 K/uL   Basophils Relative 0  0 - 1 %   Basophils Absolute 0.0  0.0 - 0.1 K/uL  BASIC METABOLIC PANEL     Status: Abnormal   Collection Time    12/06/12  5:50 AM      Result Value Range   Sodium 127 (*) 135 - 145 mEq/L   Potassium 4.8  3.5 - 5.1 mEq/L   Chloride 94 (*) 96 - 112 mEq/L   CO2 29  19 - 32 mEq/L   Glucose, Bld 177 (*) 70 - 99 mg/dL   BUN 14  6 - 23  mg/dL   Creatinine, Ser 8.11  0.50 - 1.35 mg/dL   Calcium 8.5  8.4 - 91.4 mg/dL   GFR calc non Af Amer >90  >90 mL/min   GFR calc Af Amer >90  >90 mL/min   Comment:            The eGFR has been calculated     using the CKD EPI equation.     This calculation has not been     validated in all clinical     situations.     eGFR's persistently     <90 mL/min signify     possible Chronic Kidney Disease.  GLUCOSE, CAPILLARY     Status: Abnormal   Collection Time    12/06/12  7:33 AM      Result Value Range   Glucose-Capillary 211 (*) 70 - 99 mg/dL   Comment 1 Documented in Chart     Comment 2 Notify RN     Dg Shoulder Right  12/06/2012   *RADIOLOGY REPORT*  Clinical Data: Right shoulder pain after fall.  RIGHT SHOULDER - 2+ VIEW  Comparison: None.  Findings: There is a transverse fracture of the surgical neck of the right humerus with mild lateral displacement and impaction of fracture fragments.  No glenohumeral dislocation.  Degenerative changes in the acromioclavicular and glenohumeral joints.  IMPRESSION: Transverse fracture of the right humeral neck with mild displacement and impaction.   Original Report Authenticated By: Burman Nieves, M.D.   Dg Hip Complete Right  12/06/2012   *RADIOLOGY REPORT*  Clinical Data: Trauma  RIGHT HIP - COMPLETE 2+ VIEW  Comparison: None.  Findings: There is an acute minimally  displaced oblique fracture traversing the proximal right femur, with extension into the lesser trochanter.  The right femoral head remains in normal anatomic alignment with the acetabulum. Subtle lucency extending superior laterally is concerning for intertrochanteric extension to involve the right greater trochanter as well.  Diffuse osteopenia is noted.  No acute soft tissue abnormality.  No radiopaque foreign body.  IMPRESSION:  Acute minimally displaced fracture of the proximal right femur as above.   Original Report Authenticated By: Rise Mu, M.D.   Dg Chest Port 1 View  12/06/2012   *RADIOLOGY REPORT*  Clinical Data: surgical candidate  CHEST - 1 VIEW  Comparison:  Prior radiograph from 11/16/2010.  Findings: The cardiac and mediastinal silhouettes are stable in size and contour, and remain within normal limits.  The lungs are well inflated.  No airspace consolidation, pleural effusion, or pulmonary edema.  An acute minimally displaced fracture of the proximal humeral shaft is present.  No other acute osseous abnormality.  IMPRESSION:  1.  No acute cardiopulmonary process. 2.  Acute fracture through the proximal right humeral shaft, incompletely evaluated on this examination.   Original Report Authenticated By: Rise Mu, M.D.    Review of Systems  Musculoskeletal:       See h/p   All other systems reviewed and are negative.    Blood pressure 139/75, pulse 85, temperature 97.5 F (36.4 C), temperature source Oral, resp. rate 12, height 5\' 11"  (1.803 m), weight 144 lb 6.4 oz (65.5 kg), SpO2 98.00%. Physical Exam  Constitutional: He is oriented to person, place, and time. He appears well-developed and well-nourished.  Thin   HENT:  Abrasion nasal area   Eyes: Right eye exhibits no discharge. Left eye exhibits no discharge. No scleral icterus.  Neck: No thyromegaly present.  Cardiovascular: Normal rate and intact distal pulses.  Respiratory: Effort normal. No stridor.   GI: Soft. He exhibits no distension. There is no tenderness.  Musculoskeletal:       Right shoulder: He exhibits decreased range of motion, tenderness, bony tenderness, swelling, deformity and pain. He exhibits no effusion, no crepitus, no laceration, no spasm and normal pulse.       Right hip: He exhibits decreased range of motion, tenderness and bony tenderness. He exhibits normal strength, no swelling, no crepitus and no laceration.       Right knee: He exhibits decreased range of motion and abnormal alignment. He exhibits no swelling, no effusion, no ecchymosis, no erythema, no LCL laxity, normal patellar mobility, no bony tenderness, normal meniscus and no MCL laxity. No tenderness found. No medial joint line, no lateral joint line, no MCL, no LCL and no patellar tendon tenderness noted.       Right ankle: Normal.       Left ankle: Normal.  Left Upper extremity exam Inspection and palpation revealed no abnormalities Range of motion is full without contracture. Motor exam is normal with grade 5 strength. The joints are fully reduced without subluxation. There is no atrophy or tremor and muscle tone is normal.    Left lower extremity  Inspection and palpation revealed no abnormalities Range of motion is full without contracture. Motor exam is normal with grade 5 strength. The joints are fully reduced without subluxation. There is no atrophy or tremor and muscle tone is normal.  Right knee incision previous surgery with hardware      Lymphadenopathy:    He has no cervical adenopathy.  Neurological: He is alert and oriented to person, place, and time. He exhibits normal muscle tone.  DTR deferred   Skin: Skin is warm and dry. No rash noted. No erythema. No pallor.  Diabetic foot exam no lesions on his feet   Psychiatric: He has a normal mood and affect. His behavior is normal. Judgment and thought content normal.     Assessment/Plan Right proximal humerus fracture proximal  transverse   Right hip fracture intertrochanteric   Diabetes: HOLD INSULIN LAST CBG 211  OTIF RIGHT HIP / GAMMA NAIL  CLOSED IMMOBILIZATION RIGHT HUMERUS/MAY NEED SURGERY LATER  I NEED A PT/INR AND A TYPE AND CROSS   RISK AND BENEFITS EXPLAINED AND ACCEPTED   Fuller Canada 12/06/2012, 8:19 AM

## 2012-12-06 NOTE — Progress Notes (Signed)
UR chart review completed.  

## 2012-12-06 NOTE — Anesthesia Postprocedure Evaluation (Signed)
  Anesthesia Post-op Note  Patient: Daniel Campos  Procedure(s) Performed: Procedure(s) with comments: INTRAMEDULLARY (IM) NAIL INTERTROCHANTRIC (Right) - after lunch   Patient Location: PACU  Anesthesia Type:Spinal  Level of Consciousness: awake, alert , oriented and patient cooperative  Airway and Oxygen Therapy: Patient Spontanous Breathing  Post-op Pain: 3 /10, mild  Post-op Assessment: Post-op Vital signs reviewed, Patient's Cardiovascular Status Stable, Respiratory Function Stable, Patent Airway and Pain level controlled  Post-op Vital Signs: Reviewed and stable  Complications: No apparent anesthesia complications

## 2012-12-06 NOTE — ED Provider Notes (Signed)
History    CSN: [id number] Arrival date & time 12/06/12  0302  First MD Initiated Contact with Patient 12/06/12 0335     Chief Complaint  Patient presents with  . Fall  . Shoulder Pain  . Hip Pain   (Consider location/radiation/quality/duration/timing/severity/associated sxs/prior Treatment) HPI HPI Comments: Daniel Campos is a 57 y.o. male with a h/o DM, HTN, BPH brought in by ambulance, who presents to the Emergency Department complaining of fall at home. He checked his sugar which was low and drank orange juice. Recheck of his sugar was 120. He lost his balance and fell onto his right side. Now with c/o right shoulder and hip pain. Hit his nose as he was going down. No LOC.   PCP Dr. Gerda Diss Past Medical History  Diagnosis Date  . Diabetes mellitus without complication   . Prostate hypertrophy   . Hypertension    Past Surgical History  Procedure Laterality Date  . Knee surgery     Family History  Problem Relation Age of Onset  . Heart attack Mother    History  Substance Use Topics  . Smoking status: Current Every Day Smoker -- 1.00 packs/day  . Smokeless tobacco: Not on file  . Alcohol Use: No    Review of Systems  Constitutional: Negative for fever.       10 Systems reviewed and are negative for acute change except as noted in the HPI.  HENT: Negative for congestion.   Eyes: Negative for discharge and redness.  Respiratory: Negative for cough and shortness of breath.   Cardiovascular: Negative for chest pain.  Gastrointestinal: Negative for vomiting and abdominal pain.  Musculoskeletal: Negative for back pain.       Right shoulder and right hip pain  Skin: Negative for rash.  Neurological: Negative for syncope, numbness and headaches.  Psychiatric/Behavioral:       No behavior change.    Allergies  Review of patient's allergies indicates no known allergies.  Home Medications   Current Outpatient Rx  Name  Route  Sig  Dispense  Refill  .  aspirin 81 MG tablet   Oral   Take 81 mg by mouth daily.         . enalapril (VASOTEC) 10 MG tablet   Oral   Take 20 mg by mouth daily.          . insulin NPH-regular (NOVOLIN 70/30) (70-30) 100 UNIT/ML injection   Subcutaneous   Inject into the skin. 40 units in am  20 units in pm         . verapamil (COVERA HS) 180 MG (CO) 24 hr tablet   Oral   Take 180 mg by mouth at bedtime.          BP 117/73  Pulse 83  Temp(Src) 97.8 F (36.6 C) (Oral)  Resp 18  Ht 5\' 11"  (1.803 m)  Wt 140 lb (63.504 kg)  BMI 19.53 kg/m2  SpO2 99% Physical Exam  Nursing note and vitals reviewed. Constitutional: He appears well-developed and well-nourished.  Awake, alert, nontoxic appearance.  HENT:  Head: Normocephalic.  Small laceration at bridge of nose.  Eyes: EOM are normal. Pupils are equal, round, and reactive to light.  Neck: Normal range of motion. Neck supple.  Cardiovascular: Normal rate and intact distal pulses.   Pulmonary/Chest: Effort normal and breath sounds normal. He exhibits no tenderness.  Abdominal: Soft. Bowel sounds are normal. There is no tenderness. There is no rebound.  Musculoskeletal: He  exhibits no tenderness.  Baseline ROM, no obvious new focal weakness.Right shoulder with pain with movement.Right hip with pain with palpation. Unable to take through ROM due to pain.  Neurological:  Mental status and motor strength appears baseline for patient and situation.  Skin: No rash noted.  Psychiatric: He has a normal mood and affect.    ED Course  Procedures (including critical care time) Results for orders placed during the hospital encounter of 12/06/12  MRSA PCR SCREENING      Result Value Range   MRSA by PCR NEGATIVE  NEGATIVE  GLUCOSE, CAPILLARY      Result Value Range   Glucose-Capillary 138 (*) 70 - 99 mg/dL  CBC WITH DIFFERENTIAL      Result Value Range   WBC 13.6 (*) 4.0 - 10.5 K/uL   RBC 3.50 (*) 4.22 - 5.81 MIL/uL   Hemoglobin 10.6 (*) 13.0 - 17.0  g/dL   HCT 16.1 (*) 09.6 - 04.5 %   MCV 86.0  78.0 - 100.0 fL   MCH 30.3  26.0 - 34.0 pg   MCHC 35.2  30.0 - 36.0 g/dL   RDW 40.9  81.1 - 91.4 %   Platelets 257  150 - 400 K/uL   Neutrophils Relative % 79 (*) 43 - 77 %   Neutro Abs 10.7 (*) 1.7 - 7.7 K/uL   Lymphocytes Relative 15  12 - 46 %   Lymphs Abs 2.1  0.7 - 4.0 K/uL   Monocytes Relative 5  3 - 12 %   Monocytes Absolute 0.7  0.1 - 1.0 K/uL   Eosinophils Relative 1  0 - 5 %   Eosinophils Absolute 0.1  0.0 - 0.7 K/uL   Basophils Relative 0  0 - 1 %   Basophils Absolute 0.0  0.0 - 0.1 K/uL  BASIC METABOLIC PANEL      Result Value Range   Sodium 127 (*) 135 - 145 mEq/L   Potassium 4.8  3.5 - 5.1 mEq/L   Chloride 94 (*) 96 - 112 mEq/L   CO2 29  19 - 32 mEq/L   Glucose, Bld 177 (*) 70 - 99 mg/dL   BUN 14  6 - 23 mg/dL   Creatinine, Ser 7.82  0.50 - 1.35 mg/dL   Calcium 8.5  8.4 - 95.6 mg/dL   GFR calc non Af Amer >90  >90 mL/min   GFR calc Af Amer >90  >90 mL/min  GLUCOSE, CAPILLARY      Result Value Range   Glucose-Capillary 211 (*) 70 - 99 mg/dL   Comment 1 Documented in Chart     Comment 2 Notify RN    PROTIME-INR      Result Value Range   Prothrombin Time 14.2  11.6 - 15.2 seconds   INR 1.12  0.00 - 1.49  GLUCOSE, CAPILLARY      Result Value Range   Glucose-Capillary 251 (*) 70 - 99 mg/dL  GLUCOSE, CAPILLARY      Result Value Range   Glucose-Capillary 260 (*) 70 - 99 mg/dL   Comment 1 Documented in Chart     Comment 2 Notify RN    HEMOGLOBIN AND HEMATOCRIT, BLOOD      Result Value Range   Hemoglobin 9.2 (*) 13.0 - 17.0 g/dL   HCT 21.3 (*) 08.6 - 57.8 %  GLUCOSE, CAPILLARY      Result Value Range   Glucose-Capillary 227 (*) 70 - 99 mg/dL  PREPARE RBC (CROSSMATCH)  Result Value Range   Order Confirmation ORDER PROCESSED BY BLOOD BANK    TYPE AND SCREEN      Result Value Range   ABO/RH(D) A POS     Antibody Screen NEG     Sample Expiration 12/09/2012     Unit Number [id number]     Blood Component  Type RED CELLS,LR     Unit division 00     Status of Unit ALLOCATED     Transfusion Status OK TO TRANSFUSE     Crossmatch Result Compatible     Unit Number [id number]     Blood Component Type RED CELLS,LR     Unit division 00     Status of Unit ALLOCATED     Transfusion Status OK TO TRANSFUSE     Crossmatch Result Compatible    ABO/RH      Result Value Range   ABO/RH(D) A POS       Date: 12/06/2012   0601  Rate: 86  Rhythm: normal sinus rhythm  QRS Axis: normal  Intervals: QT prolonged  ST/T Wave abnormalities: normal  Conduction Disutrbances:none  Narrative Interpretation:   Old EKG Reviewed: unchanged c/w 11/16/10  Dg Shoulder Right  12/06/2012   *RADIOLOGY REPORT*  Clinical Data: Right shoulder pain after fall.  RIGHT SHOULDER - 2+ VIEW  Comparison: None.  Findings: There is a transverse fracture of the surgical neck of the right humerus with mild lateral displacement and impaction of fracture fragments.  No glenohumeral dislocation.  Degenerative changes in the acromioclavicular and glenohumeral joints.  IMPRESSION: Transverse fracture of the right humeral neck with mild displacement and impaction.   Original Report Authenticated By: Burman Nieves, M.D.   Dg Hip Complete Right  12/06/2012   *RADIOLOGY REPORT*  Clinical Data: Trauma  RIGHT HIP - COMPLETE 2+ VIEW  Comparison: None.  Findings: There is an acute minimally displaced oblique fracture traversing the proximal right femur, with extension into the lesser trochanter.  The right femoral head remains in normal anatomic alignment with the acetabulum. Subtle lucency extending superior laterally is concerning for intertrochanteric extension to involve the right greater trochanter as well.  Diffuse osteopenia is noted.  No acute soft tissue abnormality.  No radiopaque foreign body.  IMPRESSION:  Acute minimally displaced fracture of the proximal right femur as above.   Original Report Authenticated By: Rise Mu, M.D.    Medications  morphine 4 MG/ML injection 2 mg (not administered)  morphine 4 MG/ML injection 2 mg (2 mg Intravenous Given 12/06/12 0411)  ondansetron (ZOFRAN) injection 4 mg (4 mg Intravenous Given 12/06/12 0411)  5:40 AM:  T/C to Dr. Romeo Apple, orthopedist, case discussed, including:  HPI, pertinent PM/SHx, VS/PE, dx testing, ED course and treatment.  Agreeable to admission  Requests to write temporary orders, med-surg bed. MDM  Patient who feel when he was addressing a low blood sugar. No LOC. Xrays reveal a right humeral neck fracture and a right femur fracture. Spoke with Dr. Romeo Apple who will admit the patient. Placed patient in shoulder immobilizer. Steri strips to bridge of nose. Pt stable in ED with no significant deterioration in condition.The patient appears reasonably stabilized for admission considering the current resources, flow, and capabilities available in the ED at this time, and I doubt any other [facility] requiring further screening and/or treatment in the ED prior to admission.  MDM Reviewed: nursing note and vitals Interpretation: labs, ECG and x-ray     Nicoletta Dress. Colon Branch, MD 12/07/12 [phone]

## 2012-12-06 NOTE — Brief Op Note (Addendum)
12/06/2012  3:08 PM  PATIENT:  Inda Coke  57 y.o. male  PRE-OPERATIVE DIAGNOSIS:  right hip fracture, intertrochanteric   POST-OPERATIVE DIAGNOSIS:  right hip fracture, same   PROCEDURE:  Procedure(s) with comments: INTRAMEDULLARY (IM) NAIL INTERTROCHANTRIC (Right) - after lunch   Gamma nail 125 short w/ 105 lag screw and 37.5 diatl locking screw, acorn proximal   Findings: Peritrochanteric fracture   SURGEON:  Surgeon(s) and Role:    * Vickki Hearing, MD - Primary  PHYSICIAN ASSISTANT:   ASSISTANTS: hollproztek   ANESTHESIA:   spinal  EBL:  Total I/O In: 1686.3 [I.V.:1686.3] Out: 1000 [Urine:1000]  BLOOD ADMINISTERED:none  DRAINS: none   LOCAL MEDICATIONS USED:  MARCAINE    and Amount: 60 ml  SPECIMEN:  No Specimen  DISPOSITION OF SPECIMEN:  N/A  COUNTS:  YES  TOURNIQUET:  * No tourniquets in log *  DICTATION: .Dragon Dictation  PLAN OF CARE: Admit to inpatient   PATIENT DISPOSITION:  PACU - hemodynamically stable.   Delay start of Pharmacological VTE agent (>24hrs) due to surgical blood loss or risk of bleeding: yes

## 2012-12-06 NOTE — Transfer of Care (Signed)
Immediate Anesthesia Transfer of Care Note  Patient: Daniel Campos  Procedure(s) Performed: Procedure(s) with comments: INTRAMEDULLARY (IM) NAIL INTERTROCHANTRIC (Right) - after lunch   Patient Location: PACU  Anesthesia Type:Spinal  Level of Consciousness: awake and patient cooperative  Airway & Oxygen Therapy: Patient Spontanous Breathing and Patient connected to face mask oxygen  Post-op Assessment: Report given to PACU RN and Post -op Vital signs reviewed and stable  Post vital signs: Reviewed and stable  Complications: No apparent anesthesia complications

## 2012-12-06 NOTE — Progress Notes (Signed)
Report called and given to Dagoberto Ligas, RN. Patient transferred to dept 300 after surgery.

## 2012-12-06 NOTE — ED Notes (Signed)
Laceration also noted to bridge of patient's nose. Scant bleeding at this time.

## 2012-12-07 LAB — CBC
Hemoglobin: 8.5 g/dL — ABNORMAL LOW (ref 13.0–17.0)
Platelets: 203 10*3/uL (ref 150–400)
RBC: 2.81 MIL/uL — ABNORMAL LOW (ref 4.22–5.81)
WBC: 9.5 10*3/uL (ref 4.0–10.5)

## 2012-12-07 LAB — BASIC METABOLIC PANEL
CO2: 27 mEq/L (ref 19–32)
Chloride: 98 mEq/L (ref 96–112)
Glucose, Bld: 160 mg/dL — ABNORMAL HIGH (ref 70–99)
Potassium: 4.1 mEq/L (ref 3.5–5.1)
Sodium: 132 mEq/L — ABNORMAL LOW (ref 135–145)

## 2012-12-07 LAB — GLUCOSE, CAPILLARY
Glucose-Capillary: 317 mg/dL — ABNORMAL HIGH (ref 70–99)
Glucose-Capillary: 144 mg/dL — ABNORMAL HIGH (ref 70–99)
Glucose-Capillary: 31 mg/dL — CL (ref 70–99)
Glucose-Capillary: 30 mg/dL — CL (ref 70–99)
Glucose-Capillary: 83 mg/dL (ref 70–99)
Glucose-Capillary: 119 mg/dL — ABNORMAL HIGH (ref 70–99)
Glucose-Capillary: 115 mg/dL — ABNORMAL HIGH (ref 70–99)

## 2012-12-07 MED ORDER — OXYCODONE-ACETAMINOPHEN 5-325 MG PO TABS
1.0000 | ORAL_TABLET | ORAL | Status: DC | PRN
  Administered 2012-12-07 – 2012-12-09 (×4): 1 via ORAL
  Filled 2012-12-07 (×4): qty 1

## 2012-12-07 MED ORDER — ALBUTEROL SULFATE (5 MG/ML) 0.5% IN NEBU
2.5000 mg | INHALATION_SOLUTION | Freq: Three times a day (TID) | RESPIRATORY_TRACT | Status: DC
  Administered 2012-12-07 – 2012-12-09 (×5): 2.5 mg via RESPIRATORY_TRACT
  Filled 2012-12-07 (×4): qty 0.5

## 2012-12-07 MED ORDER — SODIUM CHLORIDE 0.9 % IV BOLUS (SEPSIS)
500.0000 mL | Freq: Three times a day (TID) | INTRAVENOUS | Status: DC
  Administered 2012-12-07 – 2012-12-09 (×6): 500 mL via INTRAVENOUS

## 2012-12-07 MED ORDER — ALBUTEROL SULFATE (5 MG/ML) 0.5% IN NEBU: 2.5000 mg | INHALATION_SOLUTION | Freq: Four times a day (QID) | RESPIRATORY_TRACT | Status: DC

## 2012-12-07 MED ORDER — KETOROLAC TROMETHAMINE 30 MG/ML IJ SOLN
30.0000 mg | Freq: Four times a day (QID) | INTRAMUSCULAR | Status: AC
  Administered 2012-12-07 – 2012-12-08 (×4): 30 mg via INTRAVENOUS
  Filled 2012-12-07 (×4): qty 1

## 2012-12-07 NOTE — Progress Notes (Signed)
Rehab Admissions Coordinator Note:  Patient was screened by Trish Mage for appropriateness for an Inpatient Acute Rehab Consult.  Noted PT recommending CIR.  I have talked with [facility] case manager and given current diagnosis, we will not be able to gt approval for acute inpatient rehab.  At this time, we are recommending Skilled Nursing Facility.  Could also consider home with [facility] therapies if patient progresses.  Lelon Frohlich M 12/07/2012, 10:04 AM  I can be reached at [phone].

## 2012-12-07 NOTE — Progress Notes (Signed)
Physical Therapy Treatment Patient Details Name: Daniel Campos MRN: [id number] DOB: 06-08-1955 Today's Date: 12/07/2012 Time: 4098-1191 PT Time Calculation (min): 15 min  PT Assessment / Plan / Recommendation  History of Present Illness     Clinical Impression Pt instructed safe technique with sit to stand transfer with use of hemiwalker with mod assistance.  Verbal cueing for safe mechanics with sit to stand and able to perform correctly.  Pt limited by pain with weight bearing.  Pt left in bed with call bell within reach.   Follow Up Recommendations        Does the patient have the potential to tolerate intense rehabilitation     Barriers to Discharge        Equipment Recommendations       Recommendations for Other Services    Frequency     Progress towards PT Goals    Plan      Precautions / Restrictions Restrictions Weight Bearing Restrictions: Yes RUE Weight Bearing: Weight bearing as tolerated RLE Weight Bearing: Weight bearing as tolerated    Mobility  Transfers Transfers: Sit to Stand;Stand to Sit;Stand Pivot Transfers Sit to Stand: 4: Min assist Stand to Sit: 4: Min assist Stand Pivot Transfers: 3: Mod assist Details for Transfer Assistance: used hemiwalker with transfer    Exercises     PT Diagnosis:    PT Problem List:   PT Treatment Interventions:     PT Goals (current goals can now be found in the care plan section)    Visit Information  Last PT Received On: 12/07/12    Subjective Data      Cognition  Cognition Arousal/Alertness: Awake/alert Behavior During Therapy: [facility] for tasks assessed/performed Overall Cognitive Status: Within Functional Limits for tasks assessed    Balance     End of Session PT - End of Session Equipment Utilized During Treatment: Gait belt Activity Tolerance: Patient tolerated treatment well;Patient limited by pain Patient left: in bed;with call bell/phone within reach   GP     [name] 12/07/2012,  4:32 PM

## 2012-12-07 NOTE — Progress Notes (Signed)
Subjective: 1 Day Post-Op Procedure(s) (LRB): INTRAMEDULLARY (IM) NAIL INTERTROCHANTRIC (Right) Patient reports pain as moderate.    Objective: Vital signs in last 24 hours: Temp:  [96.9 F (36.1 C)-97.6 F (36.4 C)] 97.6 F (36.4 C) (07/24 0605) Pulse Rate:  [82-112] 82 (07/24 0605) Resp:  [11-24] 18 (07/24 0605) BP: (98-147)/(54-98) 110/71 mmHg (07/24 0605) SpO2:  [91 %-100 %] 98 % (07/24 0605) Weight:  [144 lb (65.318 kg)] 144 lb (65.318 kg) (07/23 1143)  Intake/Output from previous day: 07/23 0701 - 07/24 0700 In: 1906.3 [P.O.:120; I.V.:1786.3] Out: 2750 [Urine:2750] Intake/Output this shift:     Recent Labs  12/06/12 0550 12/06/12 2132 12/07/12 0552  HGB 10.6* 9.2* 8.5*    Recent Labs  12/06/12 0550 12/06/12 2132 12/07/12 0552  WBC 13.6*  --  9.5  RBC 3.50*  --  2.81*  HCT 30.1* 25.8* 24.1*  PLT 257  --  203    Recent Labs  12/06/12 0550 12/07/12 0552  NA 127* 132*  K 4.8 4.1  CL 94* 98  CO2 29 27  BUN 14 9  CREATININE 0.63 0.54  GLUCOSE 177* 160*  CALCIUM 8.5 8.0*    Recent Labs  12/06/12 0834  INR 1.12    Neurologically intact Neurovascular intact Sensation intact distally Intact pulses distally Dorsiflexion/Plantar flexion intact Incision: scant drainage Compartment soft discussed with him importance or turning leg in / he says it feels good turned out   Assessment/Plan: 1 Day Post-Op Procedure(s) (LRB): INTRAMEDULLARY (IM) NAIL INTERTROCHANTRIC (Right)  1 unit PRBC Bed to chair Change to Percocet  Add torodol  Breathing treatments   Fuller Canada 12/07/2012, 7:14 AM

## 2012-12-07 NOTE — Clinical Social Work Placement (Signed)
    Clinical Social Work Department CLINICAL SOCIAL WORK PLACEMENT NOTE 12/08/2012  Patient:  Daniel Campos, Daniel Campos  Account Number:  [id number] Admit date:  12/06/2012  Clinical Social Worker:  Santa Genera, CLINICAL SOCIAL WORKER  Date/time:  12/07/2012 10:00 AM  Clinical Social Work is seeking post-discharge placement for this patient at the following level of care:   SKILLED NURSING   (*CSW will update this form in Epic as items are completed)   12/05/2012  Patient/family provided with [facility] Department of Clinical Social Work's list of facilities offering this level of care within the geographic area requested by the patient (or if unable, by the patient's family).  12/05/2012  Patient/family informed of their freedom to choose among providers that offer the needed level of care, that participate in Medicare, Medicaid or managed care program needed by the patient, have an available bed and are willing to accept the patient.  12/05/2012  Patient/family informed of [facility]' ownership interest in [facility], as well as of the fact that they are under no obligation to receive care at this facility.  PASARR submitted to EDS on 12/07/2012 PASARR number received from EDS on 12/07/2012  FL2 transmitted to all facilities in geographic area requested by pt/family on  12/07/2012 FL2 transmitted to all facilities within larger geographic area on   Patient informed that his/her managed care company has contracts with or will negotiate with  certain facilities, including the following:     Patient/family informed of bed offers received:  12/08/2012 Patient chooses bed at [facility] Physician recommends and patient chooses bed at    Patient to be transferred to  on   Patient to be transferred to facility by   The following physician request were entered in Epic:   Additional Comments: [employer] confirms patient has SNF benefits, requires preauthorization, covers 60  days at 80% (in network), 60%/50% (out of network with/without preauth), after patient meets deductible of $1500 (in network), $5000 (out of network).  PAtient informed.  Santa Genera, LCSW Clinical Social Worker [phone])

## 2012-12-07 NOTE — Progress Notes (Signed)
Inpatient Diabetes Program Recommendations  AACE/ADA: New Consensus Statement on Inpatient Glycemic Control (2013)  Target Ranges:  Prepandial:   less than 140 mg/dL      Peak postprandial:   less than 180 mg/dL (1-2 hours)      Critically ill patients:  140 - 180 mg/dL   Results for JAHZION, BROGDEN (MRN [id number]) as of 12/07/2012 14:26  Ref. Range 12/06/2012 11:28 12/06/2012 11:53 12/06/2012 15:22 12/06/2012 21:08 12/07/2012 07:15  Glucose-Capillary Latest Range: 70-99 mg/dL 409 (H) 811 (H) 914 (H) 227 (H) 144 (H)    Inpatient Diabetes Program Recommendations Insulin - Basal: Please consider changing 70/30 to 20 units BID.  Note: Patient has a history of diabetes and takes 70/30 40 units in the morning with breakfast and 70/30 20 units in the evening with supper at home for diabetes management.  Currently, patient is ordered to receive 70/30 40 units BID for inpatient glycemic control.  Noted difference in home dose and current ordered dose of 70/30 and became concerned about potential for hypoglycemia.  Called patient's nurse Shanda Bumps, RN) and she informed me that the noon time blood glucose was 31 mg/dl which was treated and the follow up CBG was 84 mg/dl at 78:29.  In order to prevent further episodes of hypoglycemia, please decrease 70/30 to 20 units BID.  Will continue to follow.  Thanks, Orlando Penner, RN, MSN, CCRN Diabetes Coordinator Inpatient Diabetes Program [phone]

## 2012-12-07 NOTE — Progress Notes (Signed)
Hypoglycemic Event  CBG: 31  Treatment: 15 GM carbohydrate snack  Symptoms: Vision changes  Follow-up CBG: Time:1245 CBG Result: 84  Possible Reasons for Event: Medication regimen: insulin  Comments/MD notified:Will make Dr. Romeo Apple aware.    Coco Sharpnack, Joyice Faster  Remember to initiate Hypoglycemia Order Set & complete

## 2012-12-07 NOTE — Evaluation (Signed)
Physical Therapy Evaluation Patient Details Name: Daniel Campos MRN: [id number] DOB: 1956-01-08 Today's Date: 12/07/2012 Time: 0815-0850 PT Time Calculation (min): 35 min  PT Assessment / Plan / Recommendation History of Present Illness   Pt is a 57 yo male who felt his blood sugar dropping; went to get something to drink and fell fracturing his R humerus and R femur.  Pt is able to put FWB on his LE.  Pt has decreased mobility, strength and ROM.  He will need skilled care to return to his previous functional ability.     PT Assessment  Patient needs continued PT services    Follow Up Recommendations  CIR    Does the patient have the potential to tolerate intense rehabilitation    yes  Barriers to Discharge  mobility issues; two steps to get into his home.  Pt has a wife who will be supportive when he goes home.      Equipment Recommendations   Engineer, civil (consulting))    Recommendations for Other Services Rehab consult   Frequency 7X/week    Precautions / Restrictions Precautions Precautions: Fall Restrictions Weight Bearing Restrictions: Yes RUE Weight Bearing: Non weight bearing   Pertinent Vitals/Pain 6/10 hip      Mobility  Bed Mobility Bed Mobility: Supine to Sit;Sitting - Scoot to Edge of Bed;Sit to Supine Supine to Sit: 3: Mod assist Sitting - Scoot to Edge of Bed: 3: Mod assist Sit to Supine: 3: Mod assist Details for Bed Mobility Assistance: sat I at bedside x 3 minutes Transfers Transfers: Not assessed Ambulation/Gait Ambulation/Gait Assistance: Not tested (comment)    Exercises General Exercises - Lower Extremity Ankle Circles/Pumps: Both;10 reps Quad Sets: Both;10 reps Gluteal Sets: Both;10 reps Short Arc Quad: Right;10 reps;AROM Heel Slides: Right;AAROM;10 reps Hip ABduction/ADduction: Right;AAROM;10 reps   PT Diagnosis: Difficulty walking;Generalized weakness;Acute pain  PT Problem List: Decreased strength;Decreased range of motion;Decreased  activity tolerance;Decreased mobility;Decreased balance;Pain;Decreased knowledge of precautions;Decreased knowledge of use of DME PT Treatment Interventions: Gait training;Therapeutic activities;Therapeutic exercise;Balance training;DME instruction;Stair training;Functional mobility training     PT Goals(Current goals can be found in the care plan section)    Visit Information  Last PT Received On: 12/07/12       Prior Functioning  Home Living Family/patient expects to be discharged to:: Private residence Living Arrangements: Spouse/significant other Available Help at Discharge: Family Type of Home: House Home Access: Stairs to enter Secretary/administrator of Steps: 2 Entrance Stairs-Rails: Right Home Layout: One level Home Equipment: None Prior Function Level of Independence: Independent Comments: working at [employer] and [employer] Communication: No difficulties    Cognition  Cognition Arousal/Alertness: Awake/alert Overall Cognitive Status: Within Functional Limits for tasks assessed    Extremity/Trunk Assessment Upper Extremity Assessment Upper Extremity Assessment: Defer to OT evaluation Lower Extremity Assessment Lower Extremity Assessment: Generalized weakness RLE:  (decreased strength and ROM due to acute fracture)   Balance    End of Session PT - End of Session Activity Tolerance: Patient limited by pain Patient left: in bed;with call bell/phone within reach  GP     RUSSELL,CINDY 12/07/2012, 9:04 AM

## 2012-12-07 NOTE — Clinical Social Work Note (Signed)
CSW reviewed chart, noted PT recommendation for CIR and subsequent note that patient insurance does not cover CIR.  Spoke [employer], patient does have SNF benefits that cover rehab placement at 80% after deductible of $1500 in network/60% (with preauth) 50% without preauth after deductible of $5000 for out of network facility.  Patient has satistied $19.02 of deductible for this calendar year.  CSW discussed this w patient, patient agreed for CSW to initiate SNF bed search.  Prefers [place], per [facility] both [facility] and [facility] are in network facilities for his plan.  CSW will initiate process of bed search, patient asked that I contact his wife to inform her.  Santa Genera, LCSW Clinical Social Worker [phone])

## 2012-12-07 NOTE — Evaluation (Signed)
Occupational Therapy Evaluation Patient Details Name: Daniel Campos MRN: [id number] DOB: 24-Nov-1955 Today's Date: 12/07/2012 Time: 0865-7846 OT Time Calculation (min): 21 min  OT Assessment / Plan / Recommendation History of present illness 57 y/o male fell at home due to low sugar and could not ambulate.Sustained a right hip and right humerus fracture with severe pain.    Clinical Impression   Patient seen by OT this AM. Patient experiencing increased pain in RLE and RUE but agreeable to participate in OT eval. Patient was educated on self ROM exercises for RUE and proper positioning. Patient will benefit from OT services to increase ADL performance, functional transfers and RUE strength and endurance. At this time, patient requires Max-Total Assist for BADL. Recommend CIR at discharge to decrease level of assistance required for BADL.      OT Assessment  Patient needs continued OT Services    Follow Up Recommendations  CIR (SNF if CIR unavailable.)    Barriers to Discharge Inaccessible home environment Patient has 2 steps to enter.  Equipment Recommendations  3 in 1 bedside comode    Recommendations for Other Services Rehab consult  Frequency  Min 2X/week    Precautions / Restrictions Precautions Precautions: Fall Restrictions Weight Bearing Restrictions: Yes RUE Weight Bearing: Weight bearing as tolerated RLE Weight Bearing: Weight bearing as tolerated Other Position/Activity Restrictions: RUE sling for comfort measures   Pertinent Vitals/Pain 7/10 pain level RLE and RUE. Patient was medicated PTA.    ADL  Lower Body Dressing: Simulated;+1 Total assistance Where Assessed - Lower Body Dressing: Supported sit to stand Toilet Transfer: Performed;Maximal assistance Toilet Transfer Method: Surveyor, minerals:  (to recliner.) Transfers/Ambulation Related to ADLs: During transfer patient was unable to tolerate full weightbearing on RLE. patient performed toe  touch during stand pivot transfer.     OT Diagnosis: Generalized weakness;Acute pain  OT Problem List: Decreased strength;Decreased range of motion;Decreased activity tolerance;Impaired balance (sitting and/or standing);Pain;Impaired UE functional use;Decreased coordination OT Treatment Interventions: Self-care/ADL training;Therapeutic exercise;DME and/or AE instruction;Manual therapy;Patient/family education;Therapeutic activities;Modalities;Balance training   OT Goals(Current goals can be found in the care plan section)    Visit Information  Last OT Received On: 12/07/12 Assistance Needed: +1 History of Present Illness: 57 y/o male fell at home due to low sugar and could not ambulate.Sustained a right hip and right humerus fracture with severe pain.        Prior Functioning     Home Living Family/patient expects to be discharged to:: Private residence Living Arrangements: Spouse/significant other Available Help at Discharge: Family Type of Home: House Home Access: Stairs to enter Secretary/administrator of Steps: 2 Entrance Stairs-Rails: Right Home Layout: One level Home Equipment: Shower seat - built in;Grab bars - tub/shower Prior Function Level of Independence: Independent Comments: working at [employer] and [employer] Communication: No difficulties Dominant Hand: Right         Vision/Perception Vision - History Baseline Vision: No visual deficits Patient Visual Report: No change from baseline   Cognition  Cognition Arousal/Alertness: Awake/alert Behavior During Therapy: WFL for tasks assessed/performed Overall Cognitive Status: Within Functional Limits for tasks assessed    Extremity/Trunk Assessment Upper Extremity Assessment Upper Extremity Assessment: RUE deficits/detail RUE Deficits / Details: Increased pain in Right shoulder during PROM shoulder flexion. Able to withstand PROM shoulder flexion to approx. 90 degrees. AROM Elbow and wrist ranges  WFL. Unable to perform MMT due to pain and recent fracture. Decreased gross grip strength. Bruise to right bicep region.  RUE: Unable  to fully assess due to pain RUE Coordination: decreased fine motor;decreased gross motor Lower Extremity Assessment Lower Extremity Assessment: Generalized weakness RLE:  (decreased strength and ROM due to acute fracture)     Mobility Bed Mobility Bed Mobility: Supine to Sit Supine to Sit: 3: Mod assist;HOB elevated Sitting - Scoot to Edge of Bed: 3: Mod assist;With rail Sit to Supine: 3: Mod assist           End of Session OT - End of Session Equipment Utilized During Treatment: Gait belt Activity Tolerance: Patient limited by pain;Patient limited by fatigue Patient left: in chair;with call bell/phone within reach    [facility], OTR/L,CBIS   12/07/2012, 10:35 AM

## 2012-12-07 NOTE — Progress Notes (Signed)
Attempted to page Dr. Romeo Apple regarding hypoglycemic event earlier today and Diabetic Coordinator's recommendations.  No return page at this time.

## 2012-12-07 NOTE — Anesthesia Postprocedure Evaluation (Signed)
  Anesthesia Post-op Note  Patient: Daniel Campos  Procedure(s) Performed: Procedure(s) with comments: INTRAMEDULLARY (IM) NAIL INTERTROCHANTRIC (Right) - after lunch   Patient Location: Room 318  Anesthesia Type:Spinal  Level of Consciousness: awake, alert , oriented and patient cooperative  Airway and Oxygen Therapy: Patient Spontanous Breathing and Patient connected to nasal cannula oxygen  Post-op Pain: moderate  Post-op Assessment: Post-op Vital signs reviewed, Patient's Cardiovascular Status Stable, Respiratory Function Stable, Patent Airway, No signs of Nausea or vomiting, Adequate PO intake and Pain level controlled  Post-op Vital Signs: Reviewed and stable  Complications: No apparent anesthesia complications

## 2012-12-07 NOTE — Clinical Social Work Note (Signed)
CSW spoke w wife, D Luan Pulling, at patient request, updated her on placement process and SNF bed search.  Santa Genera, LCSW Clinical Social Worker [phone])

## 2012-12-07 NOTE — Progress Notes (Signed)
Physical Therapy Treatment Patient Details Name: Daniel Campos MRN: [id number] DOB: 1955/12/11 Today's Date: 12/07/2012 No charge, no time PT Assessment / Plan / Recommendation  History of Present Illness 57 y/o male fell at home due to low sugar and could not ambulate.Sustained a right hip and right humerus fracture with severe pain.    Clinical Impression Attempted second session of PT today.  Pt stated he was content in chair and c/o problems with his sugar level and wishes not to complete therapy at this time.  Will try to attempt later today.     PT Diagnosis: Difficulty walking;Generalized weakness;Acute pain  PT Problem List: Decreased strength;Decreased range of motion;Decreased activity tolerance;Decreased mobility;Decreased balance;Pain;Decreased knowledge of precautions;Decreased knowledge of use of DME PT Treatment Interventions: Gait training;Therapeutic activities;Therapeutic exercise;Balance training;DME instruction;Stair training;Functional mobility training   PT Goals (current goals can now be found in the care plan section)    Visit Information  Last PT Received On: 12/07/12 Assistance Needed: +1 Reason Eval/Treat Not Completed: Other (comment) (Attempted PT session.) History of Present Illness: 57 y/o male fell at home due to low sugar and could not ambulate.Sustained a right hip and right humerus fracture with severe pain.     Subjective Data   Pt stated he was content in chair and wishes not to complete therapy at this time.  Pt did c/o problems with his sugar level.   Balance     End of Session PT - End of Session Activity Tolerance: Patient limited by pain Patient left: in chair;with call bell/phone within reach   GP     Daniel Campos 12/07/2012, 11:58 AM

## 2012-12-08 ENCOUNTER — Encounter (HOSPITAL_COMMUNITY): Payer: Self-pay | Admitting: Orthopedic Surgery

## 2012-12-08 LAB — CBC
Hemoglobin: 8.8 g/dL — ABNORMAL LOW (ref 13.0–17.0)
RBC: 2.89 MIL/uL — ABNORMAL LOW (ref 4.22–5.81)
WBC: 10.6 10*3/uL — ABNORMAL HIGH (ref 4.0–10.5)

## 2012-12-08 LAB — BASIC METABOLIC PANEL
CO2: 26 mEq/L (ref 19–32)
Glucose, Bld: 77 mg/dL (ref 70–99)
Potassium: 3.7 mEq/L (ref 3.5–5.1)
Sodium: 133 mEq/L — ABNORMAL LOW (ref 135–145)

## 2012-12-08 LAB — GLUCOSE, CAPILLARY: Glucose-Capillary: 138 mg/dL — ABNORMAL HIGH (ref 70–99)

## 2012-12-08 MED ORDER — ENOXAPARIN SODIUM 40 MG/0.4ML ~~LOC~~ SOLN: 40.0000 mg | SUBCUTANEOUS | Status: DC

## 2012-12-08 MED ORDER — INSULIN ASPART PROT & ASPART (70-30 MIX) 100 UNIT/ML ~~LOC~~ SUSP
10.0000 [IU] | Freq: Once | SUBCUTANEOUS | Status: AC
  Administered 2012-12-08: 10 [IU] via SUBCUTANEOUS

## 2012-12-08 MED ORDER — INSULIN ASPART PROT & ASPART (70-30 MIX) 100 UNIT/ML ~~LOC~~ SUSP: 20.0000 [IU] | Freq: Two times a day (BID) | SUBCUTANEOUS | Status: DC

## 2012-12-08 MED ORDER — NICOTINE 21 MG/24HR TD PT24
21.0000 mg | MEDICATED_PATCH | Freq: Every day | TRANSDERMAL | Status: DC
  Administered 2012-12-08 – 2012-12-09 (×2): 21 mg via TRANSDERMAL
  Filled 2012-12-08 (×2): qty 1

## 2012-12-08 MED ORDER — INSULIN ASPART PROT & ASPART (70-30 MIX) 100 UNIT/ML ~~LOC~~ SUSP
20.0000 [IU] | Freq: Two times a day (BID) | SUBCUTANEOUS | Status: DC
  Administered 2012-12-08 – 2012-12-09 (×2): 20 [IU] via SUBCUTANEOUS
  Filled 2012-12-08: qty 10

## 2012-12-08 MED ORDER — OXYCODONE-ACETAMINOPHEN 5-325 MG PO TABS: 1.0000 | ORAL_TABLET | ORAL | Status: DC | PRN

## 2012-12-08 MED ORDER — NICOTINE 21 MG/24HR TD PT24: 30.0000 | MEDICATED_PATCH | Freq: Every day | TRANSDERMAL | Status: DC

## 2012-12-08 NOTE — Clinical Social Work Note (Signed)
[facility] received insurance preauth per Lynnea Ferrier.  Want patient as early as possible Sat AM, must arrive before 12:30.  Santa Genera, LCSW Clinical Social Worker [phone])

## 2012-12-08 NOTE — Clinical Social Work Note (Signed)
Patient given bed offers, patient and wife chose [facility], [facility] admissions informed and asked to seek preauthorization for SNF placement stay.    Santa Genera, LCSW Clinical Social Worker [phone])

## 2012-12-08 NOTE — Progress Notes (Signed)
Subjective: No complaints    Objective: Vital signs in last 24 hours: Temp:  [97.3 F (36.3 C)-99.2 F (37.3 C)] 98.3 F (36.8 C) (07/25 0544) Pulse Rate:  [82-99] 95 (07/25 0544) Resp:  [16-18] 18 (07/25 0544) BP: (98-135)/(54-80) 124/76 mmHg (07/25 0544) SpO2:  [97 %-100 %] 98 % (07/25 0544)  Intake/Output from previous day: 07/24 0701 - 07/25 0700 In: 2032.5 [P.O.:120; I.V.:1050; Blood:362.5; IV Piggyback:500] Out: 400 [Urine:400] Intake/Output this shift:     Recent Labs  12/06/12 0550 12/06/12 2132 12/07/12 0552 12/08/12 0526  HGB 10.6* 9.2* 8.5* 8.8*    Recent Labs  12/07/12 0552 12/08/12 0526  WBC 9.5 10.6*  RBC 2.81* 2.89*  HCT 24.1* 24.6*  PLT 203 187    Recent Labs  12/07/12 0552 12/08/12 0526  NA 132* 133*  K 4.1 3.7  CL 98 102  CO2 27 26  BUN 9 10  CREATININE 0.54 0.60  GLUCOSE 160* 77  CALCIUM 8.0* 8.3*    Recent Labs  12/06/12 0834  INR 1.12    Neurologically intact Neurovascular intact Sensation intact distally Intact pulses distally Dorsiflexion/Plantar flexion intact Incision: dressing C/D/I Compartment soft  Assessment/Plan: 1 unit of blood Decrease Insulin to 20 bid  Transfer in am    Fuller Canada 12/08/2012, 7:33 AM

## 2012-12-08 NOTE — Progress Notes (Signed)
PT Cancellation Note  Patient Details Name: Daniel Campos MRN: [id number] DOB: 1956/03/18   Cancelled Treatment:    Reason Eval/Treat Not Completed: Pain limiting ability to participate Pt has had an unrelenting HA.  He requests to have no PT today.  He states that he feels comfortable doing a transfer bed to chair using a hemiwalker.  Myrlene Broker L 12/08/2012, 11:42 AM

## 2012-12-08 NOTE — Progress Notes (Signed)
Inpatient Diabetes Program Recommendations  AACE/ADA: New Consensus Statement on Inpatient Glycemic Control (2013)  Target Ranges:  Prepandial:   less than 140 mg/dL      Peak postprandial:   less than 180 mg/dL (1-2 hours)      Critically ill patients:  140 - 180 mg/dL   Results for PRUDENCIO, VELAZCO (MRN [id number]) as of 12/08/2012 09:39  Ref. Range 12/07/2012 07:15 12/07/2012 11:25 12/07/2012 12:08 12/07/2012 12:39 12/07/2012 17:00 12/07/2012 20:27 12/08/2012 07:54  Glucose-Capillary Latest Range: 70-99 mg/dL 295 (H) 31 (LL) 30 (LL) 83 119 (H) 115 (H) 81    Inpatient Diabetes Program Recommendations Insulin - Basal: Please consider changing 70/30 to 20 units QAM with breakfast and 10 units QPM with supper.  Note: Patient received 70/30 40 units yesterday morning at 8:18 am and the evening dose of 70/30 was not given (charted as patient refused).  Fasting blood glucose this morning was noted to be 81 mg/dl despite not receiving evening dose of 70/30.  Therefore, please consider chaning 70/30 to 20 units QAM with breakfast and 70/30 10 units QPM with supper.  Will continue to follow.  Thanks, Orlando Penner, RN, MSN, CCRN Diabetes Coordinator Inpatient Diabetes Program [phone]

## 2012-12-08 NOTE — Care Management Note (Signed)
    Page 1 of 1   12/08/2012     1:29:43 PM   CARE MANAGEMENT NOTE 12/08/2012  Patient:  Daniel Campos, Daniel Campos   Account Number:  [id number]  Date Initiated:  12/08/2012  Documentation initiated by:  Rosemary Holms  Subjective/Objective Assessment:   Pt admitted from home where he lives with his wife. After falling multiple fractures. ORIF done and anticipate SNF DC     Action/Plan:   Anticipated DC Date:  12/09/2012   Anticipated DC Plan:  SKILLED NURSING FACILITY      DC Planning Services  CM consult      Choice offered to / List presented to:             Status of service:  Completed, signed off Medicare Important Message given?   (If response is "NO", the following Medicare IM given date fields will be blank) Date Medicare IM given:   Date Additional Medicare IM given:    Discharge Disposition:    Per UR Regulation:    If discussed at Long Length of Stay Meetings, dates discussed:    Comments:  12/08/12 Rosemary Holms RN BNS CM

## 2012-12-09 ENCOUNTER — Inpatient Hospital Stay
Admission: RE | Admit: 2012-12-09 | Discharge: 2012-12-29 | Disposition: A | Discharge status: Home / Self Care | Payer: 59 | Source: Ambulatory Visit | Attending: Internal Medicine | Admitting: Internal Medicine

## 2012-12-09 LAB — BASIC METABOLIC PANEL
BUN: 8 mg/dL (ref 6–23)
Calcium: 8.1 mg/dL — ABNORMAL LOW (ref 8.4–10.5)
Creatinine, Ser: 0.5 mg/dL (ref 0.50–1.35)
GFR calc non Af Amer: >90 mL/min (ref >90)
Glucose, Bld: 189 mg/dL — ABNORMAL HIGH (ref 70–99)

## 2012-12-09 LAB — TYPE AND SCREEN
Antibody Screen: NEGATIVE
Unit division: 0

## 2012-12-09 LAB — CBC
HCT: 26.3 % — ABNORMAL LOW (ref 39.0–52.0)
Hemoglobin: 9.3 g/dL — ABNORMAL LOW (ref 13.0–17.0)
MCH: 30.2 pg (ref 26.0–34.0)
MCHC: 35.4 g/dL (ref 30.0–36.0)

## 2012-12-09 NOTE — Progress Notes (Signed)
Patient received discharge instructions along with follow up appointments and prescriptions. Patient verbalized undertstanding of all instructions. Report called to [facility] . Patient going to [facility] for Rehab. Patient discharged to [facility] in stable condition.

## 2012-12-09 NOTE — Discharge Summary (Signed)
Physician Discharge Summary  Patient ID: Daniel Campos MRN: [id number] DOB/AGE: [birth year] 57 y.o.  Admit date: 12/06/2012 Discharge date: 12/09/2012  Admission Diagnoses:  Right hip fracture, right humerus fracture, diabetes, hypertension  Discharge Diagnoses: Same Active Problems:   * No active hospital problems. *   Discharged Condition: stable  Hospital Course: The patient was admitted on the 23rd he had surgery on the 23rd. He underwent open treatment internal fixation of a right hip intertrochanteric peritrochanteric fracture with a gamma nail. He tolerated this well. Postoperatively he did very well with physical therapy and a right shoulder immobilizer using a left hemiwalker. We did have trouble with variations in his glucose levels which were attributed to decreased levels of intake. He had a hemoglobin of 10 before surgery 8 after surgery and had 2 units of blood for that. He was never really symptomatic from it.  CBC    Component Value Date/Time   WBC 9.6 12/09/2012 0611   RBC 3.08* 12/09/2012 0611   HGB 9.3* 12/09/2012 0611   HCT 26.3* 12/09/2012 0611   PLT 183 12/09/2012 0611   MCV 85.4 12/09/2012 0611   MCH 30.2 12/09/2012 0611   MCHC 35.4 12/09/2012 0611   RDW 13.1 12/09/2012 0611   LYMPHSABS 2.1 12/06/2012 0550   MONOABS 0.7 12/06/2012 0550   EOSABS 0.1 12/06/2012 0550   BASOSABS 0.0 12/06/2012 0550    BMET    Component Value Date/Time   NA 134* 12/09/2012 0611   K 3.8 12/09/2012 0611   CL 101 12/09/2012 0611   CO2 25 12/09/2012 0611   GLUCOSE 189* 12/09/2012 0611   BUN 8 12/09/2012 0611   CREATININE 0.50 12/09/2012 0611   CALCIUM 8.1* 12/09/2012 0611   GFRNONAA >90 12/09/2012 0611   GFRAA >90 12/09/2012 4098     Discharge Exam: Blood pressure 119/74, pulse 87, temperature 98.2 F (36.8 C), temperature source Oral, resp. rate 20, height 5\' 11"  (1.803 m), weight 144 lb (65.318 kg), SpO2 95.00%. General appearance: alert, cooperative and appears stated age Nose:  Abrasion over the nasal area Extremities: Homans sign is negative, no sign of DVT Pulses: 2+ and symmetric Skin: Skin color, texture, turgor normal. No rashes or lesions Neurologic: Grossly normal Incision/Wound: clean wound no drainage  Disposition: 01-Home or Self Care  Discharge Orders   Future Appointments Provider Department Dept Phone   01/16/2013 8:30 AM Merlyn Albert, MD [facility] [phone]   Future Orders Complete By Expires     Call MD / Call 911  As directed     Comments:      If you experience chest pain or shortness of breath, CALL 911 and be transported to the hospital emergency room.  If you develope a fever above 101 F, pus (white drainage) or increased drainage or redness at the wound, or calf pain, call your surgeon's office.    Constipation Prevention  As directed     Comments:      Drink plenty of fluids.  Prune juice may be helpful.  You may use a stool softener, such as Colace (over the counter) 100 mg twice a day.  Use MiraLax (over the counter) for constipation as needed.    Diet - low sodium heart healthy  As directed     Discharge instructions  As directed     Comments:      wbat right leg   Sling swathe right arm   dvt prevention x 28 days   Staples out pod  14   F/u aug 6 xrays right shoulder    Discharge wound care:  As directed     Comments:      Dressing as needed right hip    Increase activity slowly as tolerated  As directed         Medication List    STOP taking these medications       insulin NPH-regular (70-30) 100 UNIT/ML injection  Commonly known as:  NOVOLIN 70/30      TAKE these medications       aspirin 81 MG tablet  Take 81 mg by mouth daily.     enalapril 10 MG tablet  Commonly known as:  VASOTEC  Take 10 mg by mouth daily.     enoxaparin 40 MG/0.4ML injection  Commonly known as:  LOVENOX  Inject 0.4 mLs (40 mg total) into the skin daily.     insulin aspart protamine- aspart (70-30) 100 UNIT/ML  injection  Commonly known as:  NOVOLOG MIX 70/30  Inject 0.2 mLs (20 Units total) into the skin 2 (two) times daily with a meal.     nicotine 21 mg/24hr patch  Commonly known as:  NICODERM CQ - dosed in mg/24 hours  Place 30 patches onto the skin daily.     oxyCODONE-acetaminophen 5-325 MG per tablet  Commonly known as:  PERCOCET/ROXICET  Take 1 tablet by mouth every 4 (four) hours as needed.     verapamil 180 MG (CO) 24 hr tablet  Commonly known as:  COVERA HS  Take 180 mg by mouth at bedtime.       titrate insulin intake as needed to control glucose with regular insulin     Follow-up Information   Follow up with Fuller Canada, MD On 12/20/2012.   Contact information:   [address], [address] [place] [address] [phone]      Followup August 6 for x-rays of his right humerus removal of staples if not removed by that time Signed: Fuller Canada 12/09/2012, 8:32 AM

## 2012-12-10 ENCOUNTER — Non-Acute Institutional Stay (SKILLED_NURSING_FACILITY): Payer: 59 | Admitting: Internal Medicine

## 2012-12-10 DIAGNOSIS — I10 Essential (primary) hypertension: Secondary | ICD-10-CM

## 2012-12-10 DIAGNOSIS — E109 Type 1 diabetes mellitus without complications: Secondary | ICD-10-CM

## 2012-12-10 DIAGNOSIS — S72141F Displaced intertrochanteric fracture of right femur, subsequent encounter for open fracture type IIIA, IIIB, or IIIC with routine healing: Secondary | ICD-10-CM

## 2012-12-10 DIAGNOSIS — S72009D Fracture of unspecified part of neck of unspecified femur, subsequent encounter for closed fracture with routine healing: Secondary | ICD-10-CM

## 2012-12-10 DIAGNOSIS — S42209A Unspecified fracture of upper end of unspecified humerus, initial encounter for closed fracture: Secondary | ICD-10-CM

## 2012-12-10 NOTE — Progress Notes (Signed)
Patient ID: Daniel Campos, male   DOB: 1956/01/09, 57 y.o.   MRN: [id number] Facility; [facility]. Chief complaint; admission to SNF. Post admission to hospital on July 23 through 12/09/2012. History; this is a previously reasonably healthy 58 year old man who was at up in the middle of the night thinking that he might be hypoglycemic. [Type 1 diabetes]. His right knee gave out on him. He had a fall and he presented with a right hip intertrochanteric fracture, as well as a right humerus fracture. The right hip was fixed with ORIF. He is due to have followup x-rays on the right humerus fracture. He did have some difficulties with his blood sugars in sugar control. Postop. He was transfused 2 units of blood for a hemoglobin of 8. The only preoperative problem. 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 He is on 70/30 insulin 20 units in the morning and 40 units in the evening when he returned from work. He does not describe hypoglycemic events frequently. I note his hemoglobin A1c was over 10. Earlier this month.  Past Medical History  Diagnosis Date  . Diabetes mellitus Type I ?retinopathy   . Prostate hypertrophy   . Hypertension     Past Surgical History  Procedure Laterality Date  . Knee surgery    . Intramedullary (im) nail intertrochanteric Right 12/06/2012    Procedure: INTRAMEDULLARY (IM) NAIL INTERTROCHANTRIC;  Surgeon: Vickki Hearing, MD;  Location: [facility];  Service: Orthopedics;  Laterality: Right;  after lunch     Medications; ASA, 81 daily, enalapril 10 mg daily, Lovenox, 40 mg subcutaneous daily for 28 days, nicotine transdermal 21 mg daily, Verapamil 180 mg daily, , insulin 7030 20 units in the skin twice a day    reports that he has been smoking.  He does not have any smokeless tobacco history on file. He reports that he does not drink alcohol or use illicit drugs. Works at [employer]. Independent  Review of systems; Respiratory no shortness of  breath. Cardiac no exertional chest symptoms. Neurologic; no prior fall. History. Musculoskeletal; describes instability and as previously repaired right knee.  Physical exam pulse ox 96% on room air, pulse 82, respirations 18. Gen. patient is not in any distress. Respiratory clear entry bilaterally. Cardiac heart sounds are normal. Abdomen no abdominal distention. No liver no spleen. GU no bladder distention. Musculoskeletal surgical incisions look stable. Right knee is unstable in both lateral and medial directions. Probably a small effusion  Impression/plan #1 right hip fracture status post ORIF. He is weightbearing as tolerated however hindered by his right humerus fracture. The patient relates that his fall was not unusually traumatic. #2 right humerus fracture; this is only currently immobilized. According to the patient and decisions about surgery. Will depend on further orthopedic followup and x-rays. #3 type 1 diabetes with poor control. Only complication. The patient describes his possible retinopathy, followed by Dr. Luciana Axe in [place]. His blood sugars so far, the facility has been really quite good. His current dose of 7030 insulin has been reduced from what he was taking at home. However, this seems reasonable. #4 hypertension; he probably should be checked for postural hypotension. #5 right knee instability which according to patient. May have contributed to his fall.

## 2012-12-11 ENCOUNTER — Other Ambulatory Visit: Payer: Self-pay | Admitting: Geriatric Medicine

## 2012-12-11 LAB — GLUCOSE, CAPILLARY: Glucose-Capillary: 212 mg/dL — ABNORMAL HIGH (ref 70–99)

## 2012-12-11 MED ORDER — OXYCODONE-ACETAMINOPHEN 5-325 MG PO TABS: 1.0000 | ORAL_TABLET | ORAL | Status: DC | PRN

## 2012-12-12 LAB — GLUCOSE, CAPILLARY
Glucose-Capillary: 190 mg/dL — ABNORMAL HIGH (ref 70–99)
Glucose-Capillary: 185 mg/dL — ABNORMAL HIGH (ref 70–99)
Glucose-Capillary: 236 mg/dL — ABNORMAL HIGH (ref 70–99)
Glucose-Capillary: 157 mg/dL — ABNORMAL HIGH (ref 70–99)

## 2012-12-14 ENCOUNTER — Other Ambulatory Visit: Payer: Self-pay | Admitting: Geriatric Medicine

## 2012-12-14 LAB — GLUCOSE, CAPILLARY
Glucose-Capillary: 260 mg/dL — ABNORMAL HIGH (ref 70–99)
Glucose-Capillary: 239 mg/dL — ABNORMAL HIGH (ref 70–99)
Glucose-Capillary: 186 mg/dL — ABNORMAL HIGH (ref 70–99)
Glucose-Capillary: 258 mg/dL — ABNORMAL HIGH (ref 70–99)

## 2012-12-14 MED ORDER — OXYCODONE-ACETAMINOPHEN 5-325 MG PO TABS: 1.0000 | ORAL_TABLET | ORAL | Status: DC | PRN

## 2012-12-15 LAB — GLUCOSE, CAPILLARY: Glucose-Capillary: 95 mg/dL (ref 70–99)

## 2012-12-17 LAB — GLUCOSE, CAPILLARY
Glucose-Capillary: 156 mg/dL — ABNORMAL HIGH (ref 70–99)
Glucose-Capillary: 236 mg/dL — ABNORMAL HIGH (ref 70–99)
Glucose-Capillary: 172 mg/dL — ABNORMAL HIGH (ref 70–99)
Glucose-Capillary: 114 mg/dL — ABNORMAL HIGH (ref 70–99)

## 2012-12-18 LAB — GLUCOSE, CAPILLARY
Glucose-Capillary: 223 mg/dL — ABNORMAL HIGH (ref 70–99)
Glucose-Capillary: 86 mg/dL (ref 70–99)
Glucose-Capillary: 88 mg/dL (ref 70–99)

## 2012-12-19 ENCOUNTER — Telehealth: Payer: Self-pay | Admitting: Family Medicine

## 2012-12-19 LAB — GLUCOSE, CAPILLARY
Glucose-Capillary: 61 mg/dL — ABNORMAL LOW (ref 70–99)
Glucose-Capillary: 242 mg/dL — ABNORMAL HIGH (ref 70–99)

## 2012-12-19 NOTE — Telephone Encounter (Signed)
Patient needs a copy of his medical records for social security.

## 2012-12-20 ENCOUNTER — Inpatient Hospital Stay (INDEPENDENT_AMBULATORY_CARE_PROVIDER_SITE_OTHER): Payer: 59

## 2012-12-20 ENCOUNTER — Ambulatory Visit (INDEPENDENT_AMBULATORY_CARE_PROVIDER_SITE_OTHER): Payer: 59 | Admitting: Orthopedic Surgery

## 2012-12-20 ENCOUNTER — Encounter: Payer: Self-pay | Admitting: Orthopedic Surgery

## 2012-12-20 VITALS — BP 120/78 | Ht 71.0 in | Wt 147.0 lb

## 2012-12-20 DIAGNOSIS — S72141B Displaced intertrochanteric fracture of right femur, initial encounter for open fracture type I or II: Secondary | ICD-10-CM

## 2012-12-20 DIAGNOSIS — S72143B Displaced intertrochanteric fracture of unspecified femur, initial encounter for open fracture type I or II: Secondary | ICD-10-CM

## 2012-12-20 DIAGNOSIS — S4291XA Fracture of right shoulder girdle, part unspecified, initial encounter for closed fracture: Secondary | ICD-10-CM

## 2012-12-20 DIAGNOSIS — S4290XA Fracture of unspecified shoulder girdle, part unspecified, initial encounter for closed fracture: Secondary | ICD-10-CM | POA: Insufficient documentation

## 2012-12-20 DIAGNOSIS — S42209A Unspecified fracture of upper end of unspecified humerus, initial encounter for closed fracture: Secondary | ICD-10-CM

## 2012-12-20 DIAGNOSIS — S72141A Displaced intertrochanteric fracture of right femur, initial encounter for closed fracture: Secondary | ICD-10-CM | POA: Insufficient documentation

## 2012-12-20 LAB — GLUCOSE, CAPILLARY
Glucose-Capillary: 104 mg/dL — ABNORMAL HIGH (ref 70–99)
Glucose-Capillary: 162 mg/dL — ABNORMAL HIGH (ref 70–99)

## 2012-12-20 MED ORDER — IBUPROFEN 800 MG PO TABS: 800.0000 mg | ORAL_TABLET | Freq: Three times a day (TID) | ORAL | Status: DC | PRN

## 2012-12-20 MED ORDER — OXYCODONE-ACETAMINOPHEN 5-325 MG PO TABS: 1.0000 | ORAL_TABLET | ORAL | Status: DC

## 2012-12-20 NOTE — Patient Instructions (Addendum)
TED HOSE RIGHT LEG   BRACE RIGHT KNEE   START OT / PT RIGHT SHOULDER ROM AS TOLERATED , WBAT   PERCOCET Q4 SCHEDULED

## 2012-12-20 NOTE — Progress Notes (Signed)
Patient ID: Daniel Campos, male   DOB: Jan 23, 1956, 57 y.o.   MRN: [id number] Chief Complaint  Patient presents with  . Follow-up    Hospital follow up right intertrochanteric hip fracture  DOS 12/06/12 and right shoulder fracture    This is a two-week visit status post open treatment internal fixation of right hip fracture with a gamma nail. He also has right proximal humerus fracture which will require x-ray today to determine further treatment  He has been in rehabilitation at skilled nursing.  Shoulder x-ray looks fairly good especially on the [facility] he's impacted has a little English on the lateral view but I think overall this should do okay with nonoperative treatment I was able to passively abduct to 90 externally rotate 40 flexed to 70 he did have tenderness near the fracture site elbow range of motion was normal he had some ankle swelling knee was a little unstable as chronic recommend brace  Return 2 weeks repeat x-ray right shoulder

## 2012-12-21 LAB — GLUCOSE, CAPILLARY
Glucose-Capillary: 300 mg/dL — ABNORMAL HIGH (ref 70–99)
Glucose-Capillary: 323 mg/dL — ABNORMAL HIGH (ref 70–99)

## 2012-12-22 LAB — GLUCOSE, CAPILLARY: Glucose-Capillary: 180 mg/dL — ABNORMAL HIGH (ref 70–99)

## 2012-12-23 LAB — GLUCOSE, CAPILLARY
Glucose-Capillary: 362 mg/dL — ABNORMAL HIGH (ref 70–99)
Glucose-Capillary: 323 mg/dL — ABNORMAL HIGH (ref 70–99)

## 2012-12-24 LAB — GLUCOSE, CAPILLARY
Glucose-Capillary: 390 mg/dL — ABNORMAL HIGH (ref 70–99)
Glucose-Capillary: 376 mg/dL — ABNORMAL HIGH (ref 70–99)
Glucose-Capillary: 356 mg/dL — ABNORMAL HIGH (ref 70–99)
Glucose-Capillary: 378 mg/dL — ABNORMAL HIGH (ref 70–99)

## 2012-12-25 LAB — GLUCOSE, CAPILLARY
Glucose-Capillary: 157 mg/dL — ABNORMAL HIGH (ref 70–99)
Glucose-Capillary: 227 mg/dL — ABNORMAL HIGH (ref 70–99)
Glucose-Capillary: 137 mg/dL — ABNORMAL HIGH (ref 70–99)
Glucose-Capillary: 227 mg/dL — ABNORMAL HIGH (ref 70–99)

## 2012-12-26 LAB — GLUCOSE, CAPILLARY: Glucose-Capillary: 239 mg/dL — ABNORMAL HIGH (ref 70–99)

## 2012-12-27 LAB — GLUCOSE, CAPILLARY
Glucose-Capillary: 153 mg/dL — ABNORMAL HIGH (ref 70–99)
Glucose-Capillary: 141 mg/dL — ABNORMAL HIGH (ref 70–99)
Glucose-Capillary: 125 mg/dL — ABNORMAL HIGH (ref 70–99)

## 2012-12-28 ENCOUNTER — Encounter: Payer: Self-pay | Admitting: Internal Medicine

## 2012-12-28 ENCOUNTER — Non-Acute Institutional Stay (SKILLED_NURSING_FACILITY): Payer: 59 | Admitting: Internal Medicine

## 2012-12-28 ENCOUNTER — Telehealth: Payer: Self-pay | Admitting: Orthopedic Surgery

## 2012-12-28 DIAGNOSIS — S72141D Displaced intertrochanteric fracture of right femur, subsequent encounter for closed fracture with routine healing: Secondary | ICD-10-CM

## 2012-12-28 DIAGNOSIS — I10 Essential (primary) hypertension: Secondary | ICD-10-CM

## 2012-12-28 DIAGNOSIS — S4291XA Fracture of right shoulder girdle, part unspecified, initial encounter for closed fracture: Secondary | ICD-10-CM

## 2012-12-28 DIAGNOSIS — S72009D Fracture of unspecified part of neck of unspecified femur, subsequent encounter for closed fracture with routine healing: Secondary | ICD-10-CM

## 2012-12-28 DIAGNOSIS — E119 Type 2 diabetes mellitus without complications: Secondary | ICD-10-CM

## 2012-12-28 LAB — GLUCOSE, CAPILLARY
Glucose-Capillary: 279 mg/dL — ABNORMAL HIGH (ref 70–99)
Glucose-Capillary: 140 mg/dL — ABNORMAL HIGH (ref 70–99)

## 2012-12-28 NOTE — Telephone Encounter (Signed)
Relayed Dr. Mort Sawyers response to Clydie Braun at [facility].

## 2012-12-28 NOTE — Telephone Encounter (Signed)
Quenten Raven at [facility] said Brynn Reznik wants to go home tomorrow.  He is still on Lovenox, she asked if you want to discontinue the Lovenox or prescribe something by mouth to take at home. Karen's  Pager # [phone]

## 2012-12-28 NOTE — Telephone Encounter (Signed)
LOVENOX FOR HIPS IS 30 DAYS

## 2012-12-28 NOTE — Progress Notes (Signed)
Patient ID: Daniel Campos, male   DOB: 1955-07-29, 57 y.o.   MRN: [id number]   .this note was created in error..please disregard This encounter was created in error - please disregard. This encounter was created in error - please disregard. This encounter was created in error - please disregard. This encounter was created in error - please disregard.

## 2012-12-29 LAB — GLUCOSE, CAPILLARY: Glucose-Capillary: 178 mg/dL — ABNORMAL HIGH (ref 70–99)

## 2013-01-01 ENCOUNTER — Encounter: Payer: Self-pay | Admitting: Family Medicine

## 2013-01-01 ENCOUNTER — Telehealth: Payer: Self-pay | Admitting: Family Medicine

## 2013-01-01 ENCOUNTER — Ambulatory Visit (INDEPENDENT_AMBULATORY_CARE_PROVIDER_SITE_OTHER): Payer: 59 | Admitting: Family Medicine

## 2013-01-01 VITALS — BP 112/76 | Ht 71.0 in | Wt 142.2 lb

## 2013-01-01 DIAGNOSIS — T148XXA Other injury of unspecified body region, initial encounter: Secondary | ICD-10-CM

## 2013-01-01 DIAGNOSIS — M899 Disorder of bone, unspecified: Secondary | ICD-10-CM

## 2013-01-01 DIAGNOSIS — M8580 Other specified disorders of bone density and structure, unspecified site: Secondary | ICD-10-CM

## 2013-01-01 DIAGNOSIS — I10 Essential (primary) hypertension: Secondary | ICD-10-CM

## 2013-01-01 DIAGNOSIS — E119 Type 2 diabetes mellitus without complications: Secondary | ICD-10-CM

## 2013-01-01 MED ORDER — GLUCOSE BLOOD VI STRP: ORAL_STRIP | Status: DC

## 2013-01-01 NOTE — Telephone Encounter (Signed)
Pt calling to see if you remembered to send in the script for his insulin strips?

## 2013-01-01 NOTE — Progress Notes (Signed)
  Subjective:    Patient ID: Daniel Campos, male    DOB: 12/13/1955, 57 y.o.   MRN: [id number]  HPI Patient arrives to the office for a complicated followup visit to the hospital. Next  Several weeks ago he took a fall. He fractured a shoulder. He fractured hip. He required surgery.  Patient states his original fall may have been 2 do to a low sugar spells. Patient has positive family history of diabetes sister has probable type 1 diabetes he himself was admitted for likely diabetic ketoacidosis, so essentially he is a type I diabetic.  A full review of the ER and hospital notes were obtained, x-ray readings from the radiologist revealed osteopenia. Patient and wife were not aware of this.  Patient claims compliance with his insulin currently he is on 18 units twice a day. He is having more difficulty with low sugar spells in the middle the night. Not always using a snack at night.  Was told it and his blood pressure changes considerably when standing does get occasional mild lightheadedness. Review of Systems Some polyuria some fatigue no chest pain no abdominal pain    Objective:   Physical Exam Alert no acute distress. Lungs clear. Heart regular rate and rhythm. HEENT normal. Shoulder some pain with rotation. Ankles no significant edema. Systolic while standing 118 systolic while laying down 124 diastolic similar       Assessment & Plan:  Impression 1 osteopenia needs to be explored discussed with family #2 type 1 diabetes equivalent with major hypoglycemic episodes. I think this patient is a candidate for intensive management and needs a diabetes specialist discussed #3 hypertension decent control. Plan recheck in 3 months. Endocrinology consult rationale discussed. Bone density test further recommendations based on results. WSL

## 2013-01-01 NOTE — Telephone Encounter (Signed)
Rx sent electronically to [facility]. Patient notified. 

## 2013-01-01 NOTE — Telephone Encounter (Signed)
No. relion box Numb 120 ck glu qid ref times three. dxpoorly contolled diabetes

## 2013-01-01 NOTE — Patient Instructions (Signed)
Increase morn insulin to 22  Decrease the eve ins to 14  Be sure to take a night time snack

## 2013-01-02 DIAGNOSIS — M8580 Other specified disorders of bone density and structure, unspecified site: Secondary | ICD-10-CM | POA: Insufficient documentation

## 2013-01-03 ENCOUNTER — Ambulatory Visit (HOSPITAL_COMMUNITY): Payer: 59 | Admitting: Physical Therapy

## 2013-01-03 ENCOUNTER — Ambulatory Visit (HOSPITAL_COMMUNITY)
Admission: RE | Admit: 2013-01-03 | Discharge: 2013-01-03 | Disposition: A | Discharge status: Home / Self Care | Payer: 59 | Source: Ambulatory Visit | Attending: Family Medicine | Admitting: Family Medicine

## 2013-01-03 DIAGNOSIS — M810 Age-related osteoporosis without current pathological fracture: Secondary | ICD-10-CM | POA: Insufficient documentation

## 2013-01-04 ENCOUNTER — Encounter: Payer: Self-pay | Admitting: Orthopedic Surgery

## 2013-01-04 ENCOUNTER — Ambulatory Visit (INDEPENDENT_AMBULATORY_CARE_PROVIDER_SITE_OTHER): Payer: 59 | Admitting: Orthopedic Surgery

## 2013-01-04 ENCOUNTER — Ambulatory Visit (INDEPENDENT_AMBULATORY_CARE_PROVIDER_SITE_OTHER): Payer: 59

## 2013-01-04 VITALS — BP 155/43 | Ht 71.0 in | Wt 147.0 lb

## 2013-01-04 DIAGNOSIS — S4291XD Fracture of right shoulder girdle, part unspecified, subsequent encounter for fracture with routine healing: Secondary | ICD-10-CM

## 2013-01-04 DIAGNOSIS — S72141B Displaced intertrochanteric fracture of right femur, initial encounter for open fracture type I or II: Secondary | ICD-10-CM

## 2013-01-04 DIAGNOSIS — S42209A Unspecified fracture of upper end of unspecified humerus, initial encounter for closed fracture: Secondary | ICD-10-CM

## 2013-01-04 DIAGNOSIS — S42309D Unspecified fracture of shaft of humerus, unspecified arm, subsequent encounter for fracture with routine healing: Secondary | ICD-10-CM

## 2013-01-04 DIAGNOSIS — S4291XA Fracture of right shoulder girdle, part unspecified, initial encounter for closed fracture: Secondary | ICD-10-CM

## 2013-01-04 DIAGNOSIS — S72143B Displaced intertrochanteric fracture of unspecified femur, initial encounter for open fracture type I or II: Secondary | ICD-10-CM

## 2013-01-04 MED ORDER — ALENDRONATE SODIUM 70 MG PO TABS: 70.0000 mg | ORAL_TABLET | ORAL | Status: DC

## 2013-01-04 MED ORDER — OXYCODONE-ACETAMINOPHEN 5-325 MG PO TABS: 1.0000 | ORAL_TABLET | ORAL | Status: DC

## 2013-01-04 NOTE — Addendum Note (Signed)
Addended by: Margaretha Sheffield on: 01/04/2013 10:03 AM   Modules accepted: Orders

## 2013-01-04 NOTE — Progress Notes (Signed)
Patient ID: Daniel Campos, male   DOB: 14-Dec-1955, 57 y.o.   MRN: [id number]  Chief Complaint  Patient presents with  . Follow-up    2 week recheck right shoulder with xray  DOS 12/06/12    BP 155/43  Ht 5\' 11"  (1.803 m)  Wt 147 lb (66.679 kg)  BMI 20.51 kg/m2 Encounter Diagnoses  Name Primary?  . Fracture of right shoulder, with routine healing, subsequent encounter Yes  . Intertrochanteric fracture of right hip, open type I or II, initial encounter   . Shoulder fracture, right, closed, initial encounter     The shoulder is feeling much better the x-ray shows fracture consolidation and minimal displacement  The patient is ready for therapy  Start physical therapy for the hip occupational therapy for the shoulder  8 weeks x-ray the hip

## 2013-01-04 NOTE — Patient Instructions (Signed)
Outpatient therapy

## 2013-01-05 ENCOUNTER — Ambulatory Visit (HOSPITAL_COMMUNITY): Payer: 59 | Admitting: Specialist

## 2013-01-05 ENCOUNTER — Other Ambulatory Visit: Payer: Self-pay | Admitting: *Deleted

## 2013-01-05 ENCOUNTER — Ambulatory Visit (HOSPITAL_COMMUNITY): Payer: 59 | Admitting: Physical Therapy

## 2013-01-05 MED ORDER — GLUCOSE BLOOD VI STRP: ORAL_STRIP | Status: DC

## 2013-01-10 ENCOUNTER — Ambulatory Visit (HOSPITAL_COMMUNITY): Payer: 59 | Admitting: Specialist

## 2013-01-10 DIAGNOSIS — Z7901 Long term (current) use of anticoagulants: Secondary | ICD-10-CM

## 2013-01-10 DIAGNOSIS — I10 Essential (primary) hypertension: Secondary | ICD-10-CM

## 2013-01-10 DIAGNOSIS — Z4801 Encounter for change or removal of surgical wound dressing: Secondary | ICD-10-CM

## 2013-01-16 ENCOUNTER — Ambulatory Visit (HOSPITAL_COMMUNITY)
Admission: RE | Admit: 2013-01-16 | Discharge: 2013-01-16 | Disposition: A | Discharge status: Home / Self Care | Payer: 59 | Source: Ambulatory Visit | Attending: Orthopedic Surgery | Admitting: Orthopedic Surgery

## 2013-01-16 ENCOUNTER — Ambulatory Visit: Payer: 59 | Admitting: Family Medicine

## 2013-01-16 ENCOUNTER — Telehealth: Payer: Self-pay | Admitting: Orthopedic Surgery

## 2013-01-16 DIAGNOSIS — IMO0001 Reserved for inherently not codable concepts without codable children: Secondary | ICD-10-CM | POA: Insufficient documentation

## 2013-01-16 DIAGNOSIS — M25519 Pain in unspecified shoulder: Secondary | ICD-10-CM | POA: Insufficient documentation

## 2013-01-16 DIAGNOSIS — E119 Type 2 diabetes mellitus without complications: Secondary | ICD-10-CM | POA: Insufficient documentation

## 2013-01-16 DIAGNOSIS — M25619 Stiffness of unspecified shoulder, not elsewhere classified: Secondary | ICD-10-CM | POA: Insufficient documentation

## 2013-01-16 DIAGNOSIS — R29898 Other symptoms and signs involving the musculoskeletal system: Secondary | ICD-10-CM | POA: Insufficient documentation

## 2013-01-16 DIAGNOSIS — M2569 Stiffness of other specified joint, not elsewhere classified: Secondary | ICD-10-CM | POA: Insufficient documentation

## 2013-01-16 DIAGNOSIS — I10 Essential (primary) hypertension: Secondary | ICD-10-CM | POA: Insufficient documentation

## 2013-01-16 NOTE — Evaluation (Signed)
Physical Therapy Evaluation  Patient Details  Name: Daniel Campos MRN: [id number] Date of Birth: [birth year]  Today's Date: 01/16/2013 Time: 1020-1115 PT Time Calculation (min): 55 min Charge evaluation 1020-1100; there ex 1100-1115             Visit#: 1 of 4  Re-eval: 02/15/13 Assessment Diagnosis: R shoulder fracture with adhesions Surgical Date:  (immobilzed) Next MD Visit: 03/02/2013 Prior Therapy: SNF  Authorization: [employer]    Authorization Time Period:    Authorization Visit#: 1 of 4   Past Medical History:  Past Medical History  Diagnosis Date  . Diabetes mellitus without complication   . Prostate hypertrophy   . Hypertension    Past Surgical History:  Past Surgical History  Procedure Laterality Date  . Knee surgery    . Intramedullary (im) nail intertrochanteric Right 12/06/2012    Procedure: INTRAMEDULLARY (IM) NAIL INTERTROCHANTRIC;  Surgeon: Vickki Hearing, MD;  Location: [facility];  Service: Orthopedics;  Laterality: Right;  after lunch     Subjective Symptoms/Limitations Symptoms: [name] states that hefell on 12/06/2012 fracturing both his right shoulder and his right hip.  He was in the hospital until 7/26/204 to SNF and stayed until 8/15/ 2014.  He had a follow up visit with his orthopedic surgeon and the pt states that the surgeon stated that his hip is fine he just needs more work on his shoulder.  HE is being referred to PT on this date to improve his ROM and function of his right shoulder.   How long can you sit comfortably?: 60 minutes. How long can you stand comfortably?: 10-15 mintues How long can you walk comfortably?: Pt is using a cane to ambulate with and  has only walked for 10-15 at a time since the accident Special Tests: Pt is walking up every two hours. Pain Assessment Currently in Pain?: Yes Pain Score: 4  Pain Location: Shoulder Pain Orientation: Right Pain Type: Chronic pain Pain Onset: More than a month ago Pain  Frequency: Constant Pain Relieving Factors: repostition. Effect of Pain on Daily Activities: increases   Prior Functioning  Prior Function Vocation: Full time employment Vocation Requirements: Production designer, theatre/television/film Leisure: Hobbies-yes (Comment) Comments: cut wood  Cognition/Observation Cognition Overall Cognitive Status: Within Functional Limits for tasks assessed   Assessment  ROM supine  RUE AROM (degrees) Right Shoulder Flexion: 120 Degrees Right Shoulder ABduction: 65 Degrees Right Shoulder Internal Rotation: 60 Degrees Right Shoulder External Rotation: 70 Degrees RUE Strength Right Shoulder Flexion: 2-/5 Right Shoulder Extension: 3/5 Right Shoulder ABduction: 2-/5 Right Shoulder Internal Rotation: 3/5 Right Shoulder External Rotation: 3-/5 RLE Strength Right Hip Flexion: 5/5 Right Hip Extension: 3/5 Right Hip ABduction: 3+/5 Right Knee Flexion: 3+/5 Right Knee Extension: 4/5  Exercise/Treatments Mobility/Balance  Static Standing Balance Single Leg Stance - Right Leg: 8 Single Leg Stance - Left Leg: 23   Supine External Rotation: Strengthening;Right;10 reps;Weights External Rotation Weight (lbs): 1# Internal Rotation: Right;10 reps;Limitations Internal Rotation Limitations: 1# Flexion: 10 reps;Other (comment) (wand) ABduction: 10 reps;Other (comment) Other Supine Exercises: wand Seated  shoulder flex, abduction on table for increased ROM Prone  Other Prone Exercises: Hip extension x 10 Sidelying Other Sidelying Exercises: Hip abduction x 10 Standing Other Standing Exercises: SLS       Physical Therapy Assessment and Plan PT Assessment and Plan Clinical Impression Statement: Pt is a 57 yo male who fell and fractured his Rt shoulder ,(non-surgical) and his Rt hip ,(ORIF).  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 Pt will benefit from skilled therapeutic  intervention in order to improve on the following deficits: Decreased strength;Difficulty walking;Pain;Decreased activity tolerance;Decreased balance;Decreased range of motion;Increased fascial restricitons;Impaired UE functional use;Increased muscle spasms Rehab Potential: Good PT Frequency: Min 1X/week (do to high co-pay that pt states he can not afford.) PT Duration: 4 weeks PT Treatment/Interventions: Therapeutic activities;Therapeutic exercise;Manual techniques;Balance training;Patient/family education PT Plan: progress pt with ROM and strengthening exercises as indicated.   Pt will need HEP each time pt is advanced as he will be doing most of his therapy on his own.  Possible wall walk, shld isometric ex next treatemtn; LE lateral and forward step ups as well as lunges     Goals Home Exercise Program Pt/caregiver will Perform Home Exercise Program: For increased ROM PT Goal: Perform Home Exercise Program - Progress: Goal set today PT Short Term Goals Time to Complete Short Term Goals: 2 weeks PT Short Term Goal 1: Pt ROM to be improved by 30 degrees to allow pt to reach to shoulder level height PT Short Term Goal 2: Pt strength to be improved by 1/2 grade to allow the above to occur PT Short Term Goal 3: Pt to be ambulating in his homewithout his cane. PT Short Term Goal 4: Pt pain to be no greater than a 5/10 80% of the day PT Long Term Goals Time to Complete Long Term Goals: 4 weeks PT Long Term Goal 1: Pt to be I in advance HEP PT Long Term Goal 2: PT to be able to reach above shoulder height to allow pt to get items in higher cabinets. Long Term Goal 3: Pt strength of UE and LE to be improved by one grade to allow pt to return to work Long Term Goal 4: Pt to be ambulating without an assistive device. PT Long Term Goal 5: Pt pain to be no greater than a 3/10 80% of the day  Problem List Patient Active Problem List   Diagnosis Date Noted  . Stiffness of joint, not elsewhere  classified, other specified site 01/16/2013  . Weakness of right leg 01/16/2013  . Fracture of right shoulder 01/04/2013  . Osteopenia 01/02/2013  . Intertrochanteric fracture of right hip 12/20/2012  . Shoulder fracture 12/20/2012  . Essential hypertension, benign 10/16/2012  . Diabetes 08/16/2012  . Prostate hypertrophy 08/16/2012    General Behavior During Therapy: [facility] for tasks assessed/performed PT Plan of Care PT Home Exercise Plan: given  GP    RUSSELL,CINDY 01/16/2013, 5:19 PM  Physician Documentation Your signature is required to indicate approval of the treatment plan as stated above.  Please sign and either send electronically or make a copy of this report for your files and return this physician signed original.   Please mark one 1.__approve of plan  2. ___approve of plan with the following conditions.   ______________________________                                                          _____________________ Physician Signature  Date  

## 2013-01-16 NOTE — Telephone Encounter (Signed)
On 01/11/13, call received from [facility], Lenore Cordia, case manager for patient, requesting medical records for services by Dr Romeo Apple, as well as receiving faxed repuest (ph# [phone], [phone], fax [phone].  I returned call, and reached voice mail - I left message requesting a copy of signed release in order to send medical record information.  This appears to be an update of a request previously requested to [facility], our contracted medical records provider.  No return call received by insurance representative, and no faxed release received as of today, 01/16/13.  Patient had come in today also, 01/16/13, stating that he would like this request to be handled today, due to timeliness involved of his disability claim.  He signed another release form.  Records were faxed accordingly to [facility] at above fax #.  Patient aware of status.

## 2013-01-24 ENCOUNTER — Other Ambulatory Visit: Payer: Self-pay | Admitting: Family Medicine

## 2013-01-25 ENCOUNTER — Other Ambulatory Visit: Payer: Self-pay | Admitting: Family Medicine

## 2013-01-26 ENCOUNTER — Ambulatory Visit (HOSPITAL_COMMUNITY)
Admission: RE | Admit: 2013-01-26 | Discharge: 2013-01-26 | Disposition: A | Discharge status: Home / Self Care | Payer: 59 | Source: Ambulatory Visit | Attending: Family Medicine | Admitting: Family Medicine

## 2013-01-26 NOTE — Progress Notes (Addendum)
Physical Therapy Treatment Patient Details  Name: Daniel Campos MRN: [id number] Date of Birth: [birth year]  Today's Date: 01/26/2013 Time: 0800-0845 PT Time Calculation (min): 45 min There ex 800-840; ROM Visit#: 2 of 4  Re-eval: 02/15/13    Authorization: [employer]  Subjective: Symptoms/Limitations Symptoms: Pt has been doing his exercises once a day; therapist encouraged twice a day. Pain Assessment Currently in Pain?: Yes Pain Score: 6  Pain Location: Shoulder Pain Orientation: Right Pain Type: Chronic pain    Exercise/Treatments      Supine External Rotation: Strengthening;Right;10 reps;Weights External Rotation Weight (lbs): 1# Internal Rotation: Right;10 reps;Limitations Internal Rotation Limitations: 1# Flexion: 10 reps;Other (comment) (wand) ABduction: 10 reps;Other (comment) Other Supine Exercises: wand Seated Other Seated Exercises: Posterior capsule/inferior capsule stretch  Prone  Other Prone Exercises: Hip extension x 10 Sidelying Other Sidelying Exercises: Hip abduction x 10 Standing External Rotation: 10 reps;Limitations External Rotation Limitations: isometrics as well as wall stretch  Internal Rotation: 10 reps;Limitations Internal Rotation Limitations: isometric Flexion: 10 reps;Limitations Flexion Limitations: isometric ABduction: 10 reps;Limitations ABduction Limitations: isometric Other Standing Exercises: single leg heelraise; lateral and forward step up x 10 Table Stretch - Flexion: 5 reps Table Stretch - Abduction: 5 reps    UBE x 6' with shoulder stretching in flexion to complete rotation.  Physical Therapy Assessment and Plan PT Assessment and Plan Clinical Impression Statement: Pt has improved in all ROM for his shoulder.  Pt states that he is working at home.  Pt shown new self mob exercises as well as begining isometrics to address strength deficits. PT Treatment/Interventions: Therapeutic activities;Therapeutic  exercise;Manual techniques;Balance training;Patient/family education PT Plan: progress pt with ROM and strengthening exercises as indicated.   Pt will need HEP each time pt is advanced as he will be doing most of his therapy on his own.Begin forward and side lunges for LE next treatement as well as wall squats; Measure arm ROM to ensure improvement in ROM.  May begin sitting arm ex once ROM for flex and abduction is at 160.    Goals  Progressing   Problem List Patient Active Problem List   Diagnosis Date Noted  . Stiffness of joint, not elsewhere classified, other specified site 01/16/2013  . Weakness of right leg 01/16/2013  . Fracture of right shoulder 01/04/2013  . Osteopenia 01/02/2013  . Intertrochanteric fracture of right hip 12/20/2012  . Shoulder fracture 12/20/2012  . Essential hypertension, benign 10/16/2012  . Diabetes 08/16/2012  . Prostate hypertrophy 08/16/2012    General Behavior During Therapy: [facility] for tasks assessed/performed PT Plan of Care PT Home Exercise Plan: new given  GP    RUSSELL,CINDY 01/26/2013, 9:42 AM

## 2013-01-27 DIAGNOSIS — Z0289 Encounter for other administrative examinations: Secondary | ICD-10-CM

## 2013-01-30 ENCOUNTER — Ambulatory Visit (HOSPITAL_COMMUNITY)
Admission: RE | Admit: 2013-01-30 | Discharge: 2013-01-30 | Disposition: A | Discharge status: Home / Self Care | Payer: 59 | Source: Ambulatory Visit | Attending: Orthopedic Surgery | Admitting: Orthopedic Surgery

## 2013-01-30 DIAGNOSIS — R29898 Other symptoms and signs involving the musculoskeletal system: Secondary | ICD-10-CM

## 2013-01-30 NOTE — Progress Notes (Signed)
Physical Therapy Treatment Patient Details  Name: Daniel Campos MRN: [id number] Date of Birth: May 29, 1955  Today's Date: 01/30/2013 Time: 1020-1140 PT Time Calculation (min): 80 min Charge: TE 1020-1140  Visit#: 3 of 4  Re-eval: 02/15/13 Assessment Diagnosis: R shoulder fracture with adhesions Next MD Visit: Romeo Apple 03/02/2013 Prior Therapy: SNF  Authorization: [employer]  Authorization Time Period:    Authorization Visit#: 3 of 4   Subjective: Symptoms/Limitations Symptoms: Pt stated hip is doing fine, Rt shoulder bothering him today.  Reports compliance with exercises 2x a day, Pain Assessment Currently in Pain?: Yes Pain Score: 4  Pain Location: Shoulder Pain Orientation: Right  Objective:  Assessment:   01/30/13 1100  RUE AROM (degrees)  Right Shoulder Flexion 135 Degrees  Right Shoulder ABduction 83 Degrees  Right Shoulder Internal Rotation 60 Degrees  Right Shoulder External Rotation 75 Degrees    Exercise/Treatments Standing Heel Raises: 15 reps;Limitations Heel Raises Limitations: toe raises 15x Forward Lunges: Both;10 reps;5 seconds Side Lunges: Both;10 reps Lateral Step Up: Right;15 reps;Hand Hold: 1;Step Height: 6" Forward Step Up: Right;15 reps;Hand Hold: 2;Step Height: 6" Step Down: Right;10 reps;Hand Hold: 1;Step Height: 6" SLS: R 25, L 47" max of 3 Supine Bridges: 15 reps;Limitations Bridges Limitations: feet on green ball; h/s curl 10x  Sidelying Hip ABduction: 15 reps Clams: 5x 10" holds Prone  Hip Extension: 15 reps   Supine External Rotation: Strengthening;Right;10 reps;Weights External Rotation Weight (lbs): 1# Internal Rotation: Right;10 reps;Limitations Internal Rotation Limitations: 1# Flexion: 10 reps;Other (comment) (1# wand) ABduction: 10 reps;Other (comment) (1# wand) Other Supine Exercises: Manual posterior/inferior capsule stretch 3x 30" Standing External Rotation: 10 reps;Limitations External Rotation  Limitations: isometrics as well as wall stretch  Internal Rotation: 10 reps;Limitations Internal Rotation Limitations: isometric Flexion: 10 reps;Limitations Flexion Limitations: isometric ABduction: 10 reps;Limitations ABduction Limitations: isometric Stretches Table Stretch - Flexion: 5 reps Table Stretch - Abduction: 5 reps    Physical Therapy Assessment and Plan PT Assessment and Plan Clinical Impression Statement: Pt progressing well towards increased ROM in all directions with Rt shoulder.  Pt able to demonstrate appropriate mechanics with all exercises with min cueing for form/technique.  Added functional strengthening exercises for LE strengthening with visible muscle fatigue with new activities.  Pt encouraged to wear tennis shoes next session for proper foot attire with exercise. PT Plan: progress pt with ROM and strengthening exercises as indicated.   Pt will need HEP each time pt is advanced as he will be doing most of his therapy on his own.  Give pt HEP print out for wall squat and lunges next session.   Measure arm ROM to ensure improvement in ROM.  May begin sitting arm ex once ROM for flex and abduction is at 160.    Goals    Problem List Patient Active Problem List   Diagnosis Date Noted  . Stiffness of joint, not elsewhere classified, other specified site 01/16/2013  . Weakness of right leg 01/16/2013  . Fracture of right shoulder 01/04/2013  . Osteopenia 01/02/2013  . Intertrochanteric fracture of right hip 12/20/2012  . Shoulder fracture 12/20/2012  . Essential hypertension, benign 10/16/2012  . Diabetes 08/16/2012  . Prostate hypertrophy 08/16/2012    PT - End of Session Activity Tolerance: Patient tolerated treatment well General Behavior During Therapy: [facility] for tasks assessed/performed  GP    [name] 01/30/2013, 12:07 PM

## 2013-02-06 ENCOUNTER — Ambulatory Visit (HOSPITAL_COMMUNITY)
Admission: RE | Admit: 2013-02-06 | Discharge: 2013-02-06 | Disposition: A | Discharge status: Home / Self Care | Payer: 59 | Source: Ambulatory Visit | Attending: Family Medicine | Admitting: Family Medicine

## 2013-02-06 NOTE — Evaluation (Signed)
Physical Therapy Re-evaluation  Patient Details  Name: Daniel Campos MRN: [id number] Date of Birth: [birth year]  Today's Date: 02/06/2013 Time: 4098-1191 PT Time Calculation (min): 43 min Charges: MMT/ROMM x 1 (4782-9562) Self care x 13'(0865-7846) Therex x 15' (1000-1015)               Visit#: 4 of 8  Re-eval: 02/15/13 Assessment Diagnosis: R shoulder fracture with adhesions Next MD Visit: Romeo Apple 03/06/2013 Prior Therapy: SNF  Authorization: [employer]    Authorization Visit#: 4 of 8   Past Medical History:  Past Medical History  Diagnosis Date  . Diabetes mellitus without complication   . Prostate hypertrophy   . Hypertension    Past Surgical History:  Past Surgical History  Procedure Laterality Date  . Knee surgery    . Intramedullary (im) nail intertrochanteric Right 12/06/2012    Procedure: INTRAMEDULLARY (IM) NAIL INTERTROCHANTRIC;  Surgeon: Vickki Hearing, MD;  Location: [facility];  Service: Orthopedics;  Laterality: Right;  after lunch     Subjective Symptoms/Limitations Symptoms: Pt states that he is having pain in shoulder and slight pain in his hip. Pain Assessment Currently in Pain?: Yes Pain Score: 6  Pain Location: Shoulder Pain Orientation: Right Multiple Pain Sites: Yes  Assessment RUE AROM (degrees) Right Shoulder Flexion: 140 Degrees (was 120) Right Shoulder ABduction: 110 Degrees (was 65) Right Shoulder Internal Rotation: 63 Degrees (was 60  Right Shoulder External Rotation: 90 Degrees (was 70) Right Shoulder Flexion: 3/5 (was 2-/5 on 01/16/13) Right Shoulder Extension: 4/5 (was 3/5 on 01/16/13) Right Shoulder ABduction: 3/5 (was 2-/5 on 01/16/13) Right Shoulder Internal Rotation: 3+/5 (was 3/5 on 01/16/13) Right Shoulder External Rotation: 3+/5 (was 3-/5 on 01/16/13) RLE Strength Right Hip Flexion: 5/5 (was 5/5 on 01/16/13) Right Hip Extension: 5/5 (was 3/5 on 01/16/13) Right Hip ABduction: 5/5 (was 3+/5 on 01/16/13) Right Knee Flexion: 5/5 (was  3+/5 on 01/16/13) Right Knee Extension: 5/5 (was 4/5 on 01/16/13)  Exercise/Treatments Mobility/Balance  Static Standing Balance Single Leg Stance - Right Leg: 25 (was 8 on 01/16/13) Single Leg Stance - Left Leg: 30 (was 23 on 01/16/13)   Supine Other Supine Exercises: PROm all directions Standing Extension: 10 reps;Theraband (to improve scapular strength) Theraband Level (Shoulder Extension): Level 3 (Green) Other Standing Exercises: Had pt complete available AROM with abd and flex in front of mirror to decrease upper trap compensation  Other Standing Exercises: Cervical retraction 10x 5" to improve posture; wall slide to increase flexion x 5  Physical Therapy Assessment and Plan PT Assessment and Plan Clinical Impression Statement: Pt is progressing well with therapy. Pt displays improved shoulder ROM and strength. RLE strength is now [facility] and pt is comfortable with completing LE exercises at home. Pt would benefit from continueing skileld PT to improve functional strenaght and ROM of right soulder.  PT Plan: Recommend to D/C PT for right hip and continue PT for right shoulder 1x per week x 4weeks.    Goals Home Exercise Program Pt/caregiver will Perform Home Exercise Program: For increased ROM PT Short Term Goals Time to Complete Short Term Goals: 2 weeks PT Short Term Goal 1: Pt ROM to be improved by 30 degrees to allow pt to reach to shoulder level height PT Short Term Goal 1 - Progress: Progressing toward goal PT Short Term Goal 2: Pt strength to be improved by 1/2 grade to allow the above to occur PT Short Term Goal 2 - Progress: Met PT Short Term Goal 3: Pt to be  ambulating in his homewithout his cane. PT Short Term Goal 3 - Progress: Met PT Short Term Goal 4: Pt pain to be no greater than a 5/10 80% of the day PT Short Term Goal 4 - Progress: Met PT Long Term Goals Time to Complete Long Term Goals: 4 weeks PT Long Term Goal 1: Pt to be I in advance HEP PT Long Term Goal 1 -  Progress: Met PT Long Term Goal 2: PT to be able to reach above shoulder height to allow pt to get items in higher cabinets. PT Long Term Goal 2 - Progress: Progressing toward goal Long Term Goal 3: Pt strength of UE and LE to be improved by one grade to allow pt to return to work Long Term Goal 3 Progress: Progressing toward goal Long Term Goal 4: Pt to be ambulating without an assistive device. Long Term Goal 4 Progress: Partly met PT Long Term Goal 5: Pt pain to be no greater than a 3/10 80% of the day Long Term Goal 5 Progress: Progressing toward goal  Problem List Patient Active Problem List   Diagnosis Date Noted  . Stiffness of joint, not elsewhere classified, other specified site 01/16/2013  . Weakness of right leg 01/16/2013  . Fracture of right shoulder 01/04/2013  . Osteopenia 01/02/2013  . Intertrochanteric fracture of right hip 12/20/2012  . Shoulder fracture 12/20/2012  . Essential hypertension, benign 10/16/2012  . Diabetes 08/16/2012  . Prostate hypertrophy 08/16/2012    PT - End of Session Activity Tolerance: Patient tolerated treatment well General Behavior During Therapy: [facility] for tasks assessed/performed  Seth Bake, PTA 02/06/2013, 10:32 AM  Physician Documentation Your signature is required to indicate approval of the treatment plan as stated above.  Please sign and either send electronically or make a copy of this report for your files and return this physician signed original.   Please mark one 1.__approve of plan  2. ___approve of plan with the following conditions.   ______________________________                                                          _____________________ Physician Signature                                                                                                             Date

## 2013-02-11 NOTE — Progress Notes (Signed)
Patient ID: Daniel Campos, male   DOB: [birth year], 57 y.o.   MRN: [id number]             Chief complaint; admission to SNF. Post admission to hospital on July 23 through 12/09/2012. History; this is a previously reasonably healthy 57 year old man who was at up in the middle of the night thinking that he might be hypoglycemic. [Type 1 diabetes]. His right knee gave out on him. He had a fall and he presented with a right hip intertrochanteric fracture, as well as a right humerus fracture. The right hip was fixed with ORIF. He is due to have followup x-rays on the right humerus fracture. He did have some difficulties with his blood sugars in sugar control. Postop. He was transfused 2 units of blood for a hemoglobin of 8. The only preoperative problem. 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 He is on 70/30 insulin 20 units in the morning and 40 units in the evening when he returned from work. He does not describe hypoglycemic events frequently. I note his hemoglobin A1c was over 10. Earlier this month.    Past Medical History   Diagnosis  Date   .  Diabetes mellitus Type I ?retinopathy     .  Prostate hypertrophy     .  Hypertension         Past Surgical History   Procedure  Laterality  Date   .  Knee surgery       .  Intramedullary (im) nail intertrochanteric  Right  12/06/2012       Procedure: INTRAMEDULLARY (IM) NAIL INTERTROCHANTRIC;  Surgeon: Vickki Hearing, MD;  Location: [facility];  Service: Orthopedics;  Laterality: Right;  after lunch        Medications; ASA, 81 daily, enalapril 10 mg daily, Lovenox, 40 mg subcutaneous daily for 28 days, nicotine transdermal 21 mg daily, Verapamil 180 mg daily, , insulin 7030 20 units in the skin twice a day     reports that he has been smoking.  He does not have any smokeless tobacco history on file. He reports that he does not drink alcohol or use illicit drugs. Works at [employer]. Independent   Review of  systems; Respiratory no shortness of breath. Cardiac no exertional chest symptoms. Neurologic; no prior fall. History. Musculoskeletal; describes instability and as previously repaired right knee.   Physical exam pulse ox 96% on room air, pulse 82, respirations 18. Gen. patient is not in any distress. Respiratory clear entry bilaterally. Cardiac heart sounds are normal. Abdomen no abdominal distention. No liver no spleen. GU no bladder distention. Musculoskeletal surgical incisions look stable. Right knee is unstable in both lateral and medial directions. Probably a small effusion   Impression/plan #1 right hip fracture status post ORIF. He is weightbearing as tolerated however hindered by his right humerus fracture. The patient relates that his fall was not unusually traumatic. #2 right humerus fracture; this is only currently immobilized. According to the patient and decisions about surgery. Will depend on further orthopedic followup and x-rays. #3 type 1 diabetes with poor control. Only complication. The patient describes his possible retinopathy, followed by Dr. Luciana Axe in [place]. His blood sugars so far, the facility has been really quite good. His current dose of 7030 insulin has been reduced from what he was taking at home. However, this seems reasonable. #4 hypertension; he probably should be checked for postural hypotension. #5 right knee instability which according to patient. May  have contributed to his fall.                                      This encounter was created in error - please disregard.

## 2013-02-11 NOTE — Progress Notes (Signed)
Patient ID: Daniel Campos, male   DOB: [birth year], 57 y.o.   MRN: [id number]  This encounter was created in error - please disregard.

## 2013-02-13 ENCOUNTER — Ambulatory Visit (HOSPITAL_COMMUNITY): Payer: 59 | Admitting: *Deleted

## 2013-02-13 ENCOUNTER — Telehealth: Payer: Self-pay | Admitting: Orthopedic Surgery

## 2013-02-13 ENCOUNTER — Encounter: Payer: Self-pay | Admitting: Internal Medicine

## 2013-02-13 NOTE — Telephone Encounter (Signed)
Move up

## 2013-02-13 NOTE — Telephone Encounter (Signed)
Left message for patient to call our office.

## 2013-02-13 NOTE — Telephone Encounter (Signed)
Daniel Campos is scheduled  For xrays of right hip fracture 03/06/13.  Unless he goes back to work before 02/28/13, he says he will lose his insurance.  He wants to know if the  appointment  For 03/06/13 can be moved up before 02/28/13, or can he go back to work before 02/28/13.  Please advise.  His # [phone]

## 2013-02-13 NOTE — Progress Notes (Signed)
Patient ID: Daniel Campos, male   DOB: 1956/01/21, 57 y.o.   MRN: [id number] Facility; [facility]. This is a discharge note.  Level of care skilled.   Chief complaint;--discharge note  HPI ; this is a previously reasonably healthy 57 year old man who was at up in the middle of the night thinking that he might be hypoglycemic. [Type 1 diabetes]. His right knee gave out on him. He had a fall and he presented with a right hip intertrochanteric fracture, as well as a right humerus fracture. The right hip was fixed with ORIF. Marland Kitchen He did have some difficulties with his blood sugars in sugar control. Postop. He was transfused 2 units of blood for a hemoglobin of 8. The only preoperative problem. 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 He is on 70/30 insulin 18 units BID--recently reduced this secondary to concerns of hypoglycemia blood sugars facility lately have run 157-mid 200s in the a.m. she continues through later day parts  . Although I do see one /300 at 4 PM . I note his hemoglobin A1c was over 10. Earlier this month.    Past Medical History   Diagnosis  Date   .  Diabetes mellitus Type I ?retinopathy     .  Prostate hypertrophy     .  Hypertension         Past Surgical History   Procedure  Laterality  Date   .  Knee surgery       .  Intramedullary (im) nail intertrochanteric  Right  12/06/2012       Procedure: INTRAMEDULLARY (IM) NAIL INTERTROCHANTRIC;  Surgeon: Vickki Hearing, MD;  Location: [facility];  Service: Orthopedics;  Laterality: Right;  after lunch        Medications; ASA, 81 daily, enalapril 10 mg daily, Lovenox, 40 mg subcutaneous daily , nicotine transdermal 21 mg daily, Verapamil 180 mg daily, , insulin 7030 18 units in the skin twice a day Percocet-09/17/2023 milligrams every 4 hours routine-     reports that he had been smoking.  He does not have any smokeless tobacco history on file. He reports that he does not drink alcohol or use illicit  drugs. Works at [employer]. Independent   Review of systems; Gen.-does not clinic fever chills.  Skin-has a small open area on his scalp this does not appear to be anything  infected Respiratory no shortness of breath. Cardiac no exertional chest symptoms. Neurologic; no complaints of headache or dizziness. Musculoskeletal; currently says pain is controlled on current medications Psyche-no co of depression.   Physical exam  He is afebrile pulse of 82 respirations 18 blood pressure 126/80. Gen. patient is not in any distress Skin is warm and dry he has a small open area on his scalp--this does not appear to be infected. Respiratory clear entry bilaterally. Cardiac heart sounds are normal. Abdomen no abdominal distention. No liver no spleen. bowel sounds are positive  GU no bladder distention. Musculoskeletal --right hip-well healed surgical scar. Right knee is unstable in both lateral and medial directions.-- Neurologic-grossly intact cranial nerves intact speech is clear  Labs.  12/11/2012.  WBC 7.2 hemoglobin 9.1 platelets 209.  Sodium 133 potassium 3.7 BUN 9 creatinine 0.51    Impression/plan #1 right hip fracture status post ORIF.  Be discharged home with PT and OT as she appears to have progressed fairly well here  #2 right humerus fracture; --at this point pain appears to be relatively well-controlled will  need orthopedic followup . #3 type 1 diabetes with poor control. O--somewhat challenging situation with occasional low blood sugars we've cut back on his insulin a bit-with this will need followup by PCP  . #4 hypertension at this point appears stable on verapamil as well as ACE inhibitor-blood pressures range 113/73-140/77-in this range --Will check BMP before discharge ##5-anticoagulation-DVT prophylaxis-currently on Lovenox as well as aspirin-we are awaiting orthopedic input on whether he needs to continue the Lovenox from their  viewpoint  #6-anemia-possibly postop-has been stable per recent lab here but will recheck this before discharge   [phone]

## 2013-02-14 NOTE — Telephone Encounter (Signed)
Appointment has been move up.

## 2013-02-20 ENCOUNTER — Ambulatory Visit (HOSPITAL_COMMUNITY)
Admission: RE | Admit: 2013-02-20 | Discharge: 2013-02-20 | Disposition: A | Discharge status: Home / Self Care | Payer: 59 | Source: Ambulatory Visit | Attending: Family Medicine | Admitting: Family Medicine

## 2013-02-20 ENCOUNTER — Other Ambulatory Visit: Payer: Self-pay | Admitting: Family Medicine

## 2013-02-20 DIAGNOSIS — E119 Type 2 diabetes mellitus without complications: Secondary | ICD-10-CM | POA: Insufficient documentation

## 2013-02-20 DIAGNOSIS — M25619 Stiffness of unspecified shoulder, not elsewhere classified: Secondary | ICD-10-CM | POA: Insufficient documentation

## 2013-02-20 DIAGNOSIS — M25519 Pain in unspecified shoulder: Secondary | ICD-10-CM | POA: Insufficient documentation

## 2013-02-20 DIAGNOSIS — IMO0001 Reserved for inherently not codable concepts without codable children: Secondary | ICD-10-CM | POA: Insufficient documentation

## 2013-02-20 DIAGNOSIS — I10 Essential (primary) hypertension: Secondary | ICD-10-CM | POA: Insufficient documentation

## 2013-02-20 NOTE — Evaluation (Signed)
Physical Therapy Evaluation  Patient Details  Name: ZYRON DEELEY MRN: [id number] Date of Birth: 1955/08/09  Today's Date: 02/20/2013 Time: 1023-1107 PT Time Calculation (min): 44 min Charge:  ROM test, manual x 10 there ex x 30             Visit#: 5 of 8  Re-eval: 03/22/13 Assessment Diagnosis: R shoulder fracture with adhesions Next MD Visit: Romeo Apple 03/06/2013 Prior Therapy: SNF  Authorization: [employer]     Past Medical History:  Past Medical History  Diagnosis Date  . Diabetes mellitus without complication   . Prostate hypertrophy   . Hypertension    Past Surgical History:  Past Surgical History  Procedure Laterality Date  . Knee surgery    . Intramedullary (im) nail intertrochanteric Right 12/06/2012    Procedure: INTRAMEDULLARY (IM) NAIL INTERTROCHANTRIC;  Surgeon: Vickki Hearing, MD;  Location: [facility];  Service: Orthopedics;  Laterality: Right;  after lunch     Subjective Symptoms/Limitations Symptoms: Pt states pain increases when he exercises.  Doing exercises twice a day. How long can you sit comfortably?: 60 minutes. How long can you stand comfortably?: 30 min was 15 How long can you walk comfortably?: Pt is using no assistive device and is able to walk as long as he would like Pain Assessment Currently in Pain?: Yes Pain Score: 3  Pain Location: Shoulder Pain Orientation: Right Pain Type: Chronic pain Pain Onset: More than a month ago Pain Frequency: Intermittent Assessment RUE AROM (degrees) Right Shoulder Flexion: 148 Degrees (was 140 on 9/23 taken supine) Right Shoulder ABduction: 140 Degrees (was 110 on 9/23 measured supine) Right Shoulder Internal Rotation: 63 Degrees (was 63 on 02/06/13 (taken in supine)) Right Shoulder External Rotation: 90 Degrees (was 75 on 01/30/13 (taken in supine)) RUE Strength Right Shoulder Flexion: 3/5 (was 2-/5 on 01/16/13) Right Shoulder Extension: 4/5 (was 3/5 on 01/16/13) Right Shoulder ABduction: 3/5 (was  2-/5 on 01/16/13) Right Shoulder Internal Rotation:  ( 4- was 3/5 on 01/16/13) Right Shoulder External Rotation: 3+/5 (was 3-/5 on 01/16/13)  Exercise/Treatments  Supine Other Supine Exercises: PROM all Other Supine Exercises: Jt capsule mobs Seated External Rotation: Both;5 reps (with abduction.) Flexion: Both;5 reps Abduction: Both;5 reps Other Seated Exercises: self inferior capsule stretch;  Other Seated Exercises: abduction from arm supported at 80 degrees on table x 5   Standing Other Standing Exercises: wall pushup x 10     Physical Therapy Assessment and Plan PT Assessment and Plan Clinical Impression Statement: Pt continues to slowly gain in ROM and strength in Rt shoulder but is frustrated with the speed.  Pt states he completes HEP twice a day; explained that he has actually came a long way in a month and that mm need time to strengthen.  Therapist urged pt to push ROM since pt is only able to afford coming to therapy once a week Rehab Potential: Good PT Frequency: Min 1X/week PT Duration: 4 weeks (for a total of 8 treatments( 9 weeks as pt was ill last week) PT Treatment/Interventions: Therapeutic activities;Therapeutic exercise;Manual techniques;Balance training;Patient/family education PT Plan: Pt to continue to progress through ROM and strengthening of shoulder.  May begin T-band strengthening for ER, extension, horizontal abduction and flexion next tretment.  Give T-band and exercise sheet    Goals Home Exercise Program Pt/caregiver will Perform Home Exercise Program: For increased ROM PT Goal: Perform Home Exercise Program - Progress: Met PT Short Term Goals PT Short Term Goal 1: Pt ROM to be improved by  30 degrees to allow pt to reach to shoulder level height PT Short Term Goal 1 - Progress: Met PT Short Term Goal 2: Pt strength to be improved by 1/2 grade to allow the above to occur PT Short Term Goal 2 - Progress: Met PT Short Term Goal 3: Pt to be ambulating in  his homewithout his cane. PT Short Term Goal 3 - Progress: Met PT Short Term Goal 4: Pt pain to be no greater than a 5/10 80% of the day PT Short Term Goal 4 - Progress: Met PT Long Term Goals Time to Complete Long Term Goals: 4 weeks PT Long Term Goal 1: Pt to be I in advance HEP PT Long Term Goal 1 - Progress: Met PT Long Term Goal 2: PT to be able to reach above shoulder height to allow pt to get items in higher cabinets. PT Long Term Goal 2 - Progress: Progressing toward goal (able to reach to cabinet height; unable to lift) Long Term Goal 3: Pt strength of UE and LE to be improved by one grade to allow pt to return to work Long Term Goal 3 Progress: Not met Long Term Goal 4: Pt to be ambulating without an assistive device. Long Term Goal 4 Progress: Met PT Long Term Goal 5: Pt pain to be no greater than a 3/10 80% of the day Long Term Goal 5 Progress: Progressing toward goal  Problem List Patient Active Problem List   Diagnosis Date Noted  . Stiffness of joint, not elsewhere classified, other specified site 01/16/2013  . Weakness of right leg 01/16/2013  . Fracture of right shoulder 01/04/2013  . Osteopenia 01/02/2013  . Intertrochanteric fracture of right hip 12/20/2012  . Shoulder fracture 12/20/2012  . Essential hypertension, benign 10/16/2012  . Diabetes 08/16/2012  . Prostate hypertrophy 08/16/2012    PT Plan of Care PT Home Exercise Plan: new sheet given with capsular stretches.  GP    Naseem Varden,CINDY 02/20/2013, 2:57 PM  Physician Documentation Your signature is required to indicate approval of the treatment plan as stated above.  Please sign and either send electronically or make a copy of this report for your files and return this physician signed original.   Please mark one 1.__approve of plan  2. ___approve of plan with the following conditions.   ______________________________                                                           _____________________ Physician Signature                                                                                                             Date

## 2013-02-22 ENCOUNTER — Encounter: Payer: Self-pay | Admitting: Orthopedic Surgery

## 2013-02-22 ENCOUNTER — Ambulatory Visit (INDEPENDENT_AMBULATORY_CARE_PROVIDER_SITE_OTHER): Payer: 59 | Admitting: Orthopedic Surgery

## 2013-02-22 ENCOUNTER — Ambulatory Visit (INDEPENDENT_AMBULATORY_CARE_PROVIDER_SITE_OTHER): Payer: 59

## 2013-02-22 VITALS — BP 108/75 | Ht 71.0 in | Wt 147.0 lb

## 2013-02-22 DIAGNOSIS — S72009D Fracture of unspecified part of neck of unspecified femur, subsequent encounter for closed fracture with routine healing: Secondary | ICD-10-CM

## 2013-02-22 DIAGNOSIS — M75101 Unspecified rotator cuff tear or rupture of right shoulder, not specified as traumatic: Secondary | ICD-10-CM

## 2013-02-22 DIAGNOSIS — S72141D Displaced intertrochanteric fracture of right femur, subsequent encounter for closed fracture with routine healing: Secondary | ICD-10-CM

## 2013-02-22 DIAGNOSIS — S43429A Sprain of unspecified rotator cuff capsule, initial encounter: Secondary | ICD-10-CM

## 2013-02-22 MED ORDER — HYDROCODONE-ACETAMINOPHEN 7.5-325 MG PO TABS: 1.0000 | ORAL_TABLET | ORAL | Status: DC | PRN

## 2013-02-22 NOTE — Patient Instructions (Signed)
You have been scheduled for an MRI scan.  Your insurance company requires a precertification prior to scheduling the MRI.  If the MRI scan is not approved we will let you know and make further treatment recommendations according to your insurance's guidelines.   We will schedule you for another  appointment to review the results and make further treatment recommendations  

## 2013-02-24 DIAGNOSIS — M75100 Unspecified rotator cuff tear or rupture of unspecified shoulder, not specified as traumatic: Secondary | ICD-10-CM | POA: Insufficient documentation

## 2013-02-24 NOTE — Progress Notes (Signed)
Patient ID: Daniel Campos, male   DOB: May 27, 1955, 57 y.o.   MRN: [id number]  Chief Complaint  Patient presents with  . Follow-up    8 week recheck right hip and shoulder    Status post fracture right hip with gamma nail fixation status post right proximal humerus fracture treated closed currently in physical therapy for that now complains of pain in his right shoulder  He also complains of stiffness in the right shoulder and loss of motion  On exam his passive flexion is 120 with pain he has some tenderness at the fracture site has decreased external rotation only 15  X-ray shows comminuted proximal humerus fracture which looks to be in reasonable alignment  There is concern that he has torn his rotator cuff at the time of injury  It would be prudent to examine this with imaging prior to recommending surgical intervention  MRI followup scheduled  Hip x-ray today shows healed right hip fracture with internal fixation with gamma nail

## 2013-02-27 ENCOUNTER — Ambulatory Visit (HOSPITAL_COMMUNITY): Payer: 59

## 2013-02-27 ENCOUNTER — Ambulatory Visit (HOSPITAL_COMMUNITY): Payer: 59 | Admitting: *Deleted

## 2013-02-28 ENCOUNTER — Telehealth: Payer: Self-pay | Admitting: Radiology

## 2013-02-28 NOTE — Telephone Encounter (Signed)
MRI at [facility] on 03-01-13. Patient is aware.

## 2013-03-01 ENCOUNTER — Ambulatory Visit (HOSPITAL_COMMUNITY)
Admission: RE | Admit: 2013-03-01 | Discharge: 2013-03-01 | Disposition: A | Discharge status: Home / Self Care | Payer: 59 | Source: Ambulatory Visit | Attending: Orthopedic Surgery | Admitting: Orthopedic Surgery

## 2013-03-01 ENCOUNTER — Other Ambulatory Visit: Payer: Self-pay | Admitting: Orthopedic Surgery

## 2013-03-01 DIAGNOSIS — M75101 Unspecified rotator cuff tear or rupture of right shoulder, not specified as traumatic: Secondary | ICD-10-CM

## 2013-03-01 DIAGNOSIS — W19XXXA Unspecified fall, initial encounter: Secondary | ICD-10-CM | POA: Insufficient documentation

## 2013-03-01 DIAGNOSIS — M25519 Pain in unspecified shoulder: Secondary | ICD-10-CM | POA: Insufficient documentation

## 2013-03-01 DIAGNOSIS — S42213A Unspecified displaced fracture of surgical neck of unspecified humerus, initial encounter for closed fracture: Secondary | ICD-10-CM | POA: Insufficient documentation

## 2013-03-06 ENCOUNTER — Inpatient Hospital Stay (HOSPITAL_COMMUNITY): Admission: RE | Admit: 2013-03-06 | Payer: 59 | Source: Ambulatory Visit | Admitting: Physical Therapy

## 2013-03-06 ENCOUNTER — Ambulatory Visit: Payer: 59 | Admitting: Orthopedic Surgery

## 2013-03-15 ENCOUNTER — Ambulatory Visit: Payer: 59 | Admitting: Orthopedic Surgery

## 2013-03-19 ENCOUNTER — Ambulatory Visit (INDEPENDENT_AMBULATORY_CARE_PROVIDER_SITE_OTHER): Payer: BC Managed Care – PPO | Admitting: Orthopedic Surgery

## 2013-03-19 ENCOUNTER — Encounter: Payer: Self-pay | Admitting: Orthopedic Surgery

## 2013-03-19 VITALS — BP 138/92 | Ht 71.0 in | Wt 147.0 lb

## 2013-03-19 DIAGNOSIS — S42309D Unspecified fracture of shaft of humerus, unspecified arm, subsequent encounter for fracture with routine healing: Secondary | ICD-10-CM

## 2013-03-19 DIAGNOSIS — S4291XD Fracture of right shoulder girdle, part unspecified, subsequent encounter for fracture with routine healing: Secondary | ICD-10-CM

## 2013-03-19 NOTE — Patient Instructions (Signed)
Call to arrange therapy 

## 2013-03-19 NOTE — Progress Notes (Signed)
Patient ID: Daniel Campos, male   DOB: Dec 11, 1955, 57 y.o.   MRN: [id number]  Chief Complaint  Patient presents with  . Results    MRI Results    MRI for right shoulder to review for possible ruptured rotator cuff secondary to failure of progression in therapy in terms of forward elevation after right proximal humerus fracture which was treated nonoperatively. This was in the setting of a right hip fracture which was treated with a gamma nail which has healed  Regular films show that the fracture looked to be healed  The MRI shows the following ununited humeral neck fracture.   Advanced rotator cuff tendinopathy/ tendinosis but no partial or full thickness rotator cuff tear.   Advanced AC joint degenerative changes, lateral downsloping of the acromion and undersurface spurring may contribute to bony impingement.   Glenohumeral joint degenerative changes and synovitis.   Advanced labral degenerative changes.   Passive range of motion 120 of flexion 40 of external rotation 100 of abduction  His surgery and injury dates were on or about 12/06/2012 so he is now 4 months status post fracture  At this point I would like to have a bone stimulator to help the healing of this fracture  He can resume physical therapy

## 2013-04-03 ENCOUNTER — Other Ambulatory Visit: Payer: Self-pay | Admitting: Family Medicine

## 2013-04-09 ENCOUNTER — Telehealth: Payer: Self-pay | Admitting: Orthopedic Surgery

## 2013-04-09 NOTE — Telephone Encounter (Signed)
Notes faxed, 04/06/13, per signed request to [facility], Fax #[phone], ph [phone], attention Rosezena Sensor, Ext# [phone]; patient aware of status.

## 2013-04-10 ENCOUNTER — Ambulatory Visit (HOSPITAL_COMMUNITY)
Admission: RE | Admit: 2013-04-10 | Payer: BC Managed Care – PPO | Source: Ambulatory Visit | Admitting: Occupational Therapy

## 2013-04-16 ENCOUNTER — Ambulatory Visit (HOSPITAL_COMMUNITY)
Admission: RE | Admit: 2013-04-16 | Discharge: 2013-04-16 | Disposition: A | Discharge status: Home / Self Care | Payer: BC Managed Care – PPO | Source: Ambulatory Visit | Attending: Family Medicine | Admitting: Family Medicine

## 2013-04-16 DIAGNOSIS — M25519 Pain in unspecified shoulder: Secondary | ICD-10-CM | POA: Insufficient documentation

## 2013-04-16 DIAGNOSIS — I10 Essential (primary) hypertension: Secondary | ICD-10-CM | POA: Insufficient documentation

## 2013-04-16 DIAGNOSIS — E119 Type 2 diabetes mellitus without complications: Secondary | ICD-10-CM | POA: Insufficient documentation

## 2013-04-16 DIAGNOSIS — M25619 Stiffness of unspecified shoulder, not elsewhere classified: Secondary | ICD-10-CM | POA: Insufficient documentation

## 2013-04-16 DIAGNOSIS — IMO0001 Reserved for inherently not codable concepts without codable children: Secondary | ICD-10-CM | POA: Insufficient documentation

## 2013-04-16 NOTE — Evaluation (Signed)
Occupational Therapy Evaluation  Patient Details  Name: Daniel Campos MRN: [id number] Date of Birth: 1956-03-20  Today's Date: 04/16/2013 Time: 1100-1145 OT Time Calculation (min): 45 min Evaluation 1100-1125 (25')  Manual 1125-1145 (20')  Visit#: 1 of 12  Re-eval: 05/14/13  Assessment Diagnosis: fracture of right shoulder with routine healing  Surgical Date:  (n/a) Next MD Visit: awaiting on call from MD office for next appointment for bone stimulator  Prior Therapy: none   Authorization: [employer]   Past Medical History:  Past Medical History  Diagnosis Date  . Diabetes mellitus without complication   . Prostate hypertrophy   . Hypertension    Past Surgical History:  Past Surgical History  Procedure Laterality Date  . Knee surgery    . Intramedullary (im) nail intertrochanteric Right 12/06/2012    Procedure: INTRAMEDULLARY (IM) NAIL INTERTROCHANTRIC;  Surgeon: Vickki Hearing, MD;  Location: [facility];  Service: Orthopedics;  Laterality: Right;  after lunch     Subjective Symptoms/Limitations Symptoms: S:  I think something with my diabetes sent me to the ground  Pertinent History: Patient is a 57 yo right handed male who states he got up in the middle of the night on December 05, 2012 to get some juice as he felt his blood sugar was low and fell into floor with resulting right hip and shoulder fractures. He states he had surgery of IM nailing on his hip and was only given instructions to use sling as needed for arm.   Limitations: sholder fracture with routine healing  Patient Stated Goals: To be able to reach my arm up again  Pain Assessment Currently in Pain?: No/denies (patient reports pain of 5/10 at night ) Pain Location: Shoulder Pain Orientation: Right Pain Type: Chronic pain Multiple Pain Sites: No  Precautions/Restrictions  Precautions Precautions: Shoulder Restrictions Weight Bearing Restrictions: No  Balance Screening Balance Screen Has the patient fallen  in the past 6 months: Yes How many times?: 1 Has the patient had a decrease in activity level because of a fear of falling? : No Is the patient reluctant to leave their home because of a fear of falling? : No  Prior Functioning  Home Living Family/patient expects to be discharged to:: Private residence Living Arrangements: Spouse/significant other Prior Function Level of Independence: Independent with basic ADLs;Independent with homemaking with ambulation Driving: Yes Vocation: Unemployed (states he lost his job due to injuries/unable to return) Leisure: Hobbies-yes (Comment) Comments: watch football, cut wood, gardening/being outside.   Assessment ADL/Vision/Perception ADL ADL Comments: patient states he is able to do all ADL tasks independent with slight discomfort however will not classify pain.  Dominant Hand: Right Vision - History Baseline Vision: No visual deficits Vision - Assessment Eye Alignment: Within Functional Limits  Cognition/Observation Cognition Overall Cognitive Status: Within Functional Limits for tasks assessed Arousal/Alertness: Awake/alert Orientation Level: Oriented X4  Sensation/Coordination/Edema Sensation Light Touch: Appears Intact Proprioception: Appears Intact Coordination Gross Motor Movements are Fluid and Coordinated: Yes Fine Motor Movements are Fluid and Coordinated: Yes  Additional Assessments RUE AROM (degrees) Overall AROM Right Upper Extremity: Deficits;Due to pain RUE Overall AROM Comments: Measured in supine  (in standing ) Right Shoulder Flexion: 140 Degrees (105 in standing) Right Shoulder ABduction: 118 Degrees (99 in standing ) Right Shoulder Internal Rotation: 79 Degrees (87 in standing) Right Shoulder External Rotation: 44 Degrees (48 in standing) RUE Strength Right Shoulder Flexion: 3/5 Right Shoulder Extension: 4/5 Right Shoulder ABduction: 3/5 Right Shoulder Internal Rotation: 3+/5 Right Shoulder External Rotation:  3/5 LUE Assessment LUE Assessment: Within Functional Limits Palpation Palpation: Min-Mod fascial restrictions pectoral, scapular regions and proximal RUE      Exercise/Treatments     Manual Therapy Manual Therapy: Myofascial release Myofascial Release: MFR and manual stretching to decrease pain and fascial restrictions and improve pain free mobility in Right shoulder, scapular, pectoral and upper arm region  Occupational Therapy Assessment and Plan OT Assessment and Plan Clinical Impression Statement: A: patient presents with decreased P/AROM  and strength of RUE/shoulder due to increased stiffness, pain and fascial restrictions causing decreased independence with all B/IADLs, work and leisure activities.  Pt will benefit from skilled therapeutic intervention in order to improve on the following deficits: Impaired UE functional use;Increased fascial restricitons;Decreased activity tolerance;Decreased range of motion;Decreased strength;Pain Rehab Potential: Good OT Frequency: Min 2X/week OT Duration: 6 weeks OT Treatment/Interventions: Self-care/ADL training;Therapeutic activities;Therapeutic exercise;Patient/family education;Manual therapy;Modalities OT Plan: P:  Skilled OT services to maximize functional potential/independence with RUE, decrease pain and fascial restrictions, improve pain free mobility and strength needed to return to prior level of function/independence needed to return to work, B/IADLs and leisure activites.    Treatment Plan:  MRF and manual stretching in supine.  Supine PROM, AAROM, isometric strengthening, seated elevation, extension, row, ball stretches, thumbtacks, prot/ele/ret/dep.     Goals Home Exercise Program Pt/caregiver will Perform Home Exercise Program: For increased ROM;For increased strengthening PT Goal: Perform Home Exercise Program - Progress: Goal set today Short Term Goals Time to Complete Short Term Goals: 3 weeks Short Term Goal 1: patient will  be independent with HEP Short Term Goal 2: Patient will improve PROM in right shoulder to [facility] for increased ability to perform household chores  Short Term Goal 3: Patietn will increase right shoulder strength to 4/5 for increased ability to perform desired job duties  Short Term Goal 4: Patient will decrease night time pain in Rt shoulder to 3/10  Short Term Goal 5: Patient will decrease fascial restrictions to min in right shoulder/chest  region Long Term Goals Time to Complete Long Term Goals: 6 weeks Long Term Goal 1: Patient will return to prior level of independence with all B/IADL, work and leisure tasks utilizing RUE as dominant Long Term Goal 2: Patient will improve AROM RUE to [facility] for increased ability to reach overhead for desired job duties Long Term Goal 3: Patient will increase right shoulder strength to 4+/5 for increased ability to lift objects Long Term Goal 4: Patient will decrease pain in right shoulder to </=1/10 Long Term Goal 5: Patient will decrease fascial restrictional to trace in right shoulder/chest regions   Problem List Patient Active Problem List   Diagnosis Date Noted  . Rotator cuff tear 02/24/2013  . Stiffness of joint, not elsewhere classified, other specified site 01/16/2013  . Weakness of right leg 01/16/2013  . Fracture of right shoulder 01/04/2013  . Osteopenia 01/02/2013  . Intertrochanteric fracture of right hip 12/20/2012  . Shoulder fracture 12/20/2012  . Essential hypertension, benign 10/16/2012  . Diabetes 08/16/2012  . Prostate hypertrophy 08/16/2012    End of Session Activity Tolerance: Patient tolerated treatment well General Behavior During Therapy: [facility] for tasks assessed/performed OT Plan of Care OT Home Exercise Plan: encouraged to cont wall walk/slides from previous therapy sessions  OT Patient Instructions: demonstration and verbalization  Consulted and Agree with Plan of Care: Patient  GO Functional Assessment Tool Used: FOTO  62/100 (initial evaluation)  Velora Mediate, OTR/L 04/16/2013, 12:13 PM  Physician Documentation Your signature is required to  indicate approval of the treatment plan as stated above.  Please sign and either send electronically or make a copy of this report for your files and return this physician signed original.  Please mark one 1.__approve of plan  2. ___approve of plan with the following conditions.   ______________________________                                                          _____________________ Physician Signature                                                                                                             Date

## 2013-04-19 ENCOUNTER — Ambulatory Visit (HOSPITAL_COMMUNITY)
Admission: RE | Admit: 2013-04-19 | Discharge: 2013-04-19 | Disposition: A | Discharge status: Home / Self Care | Payer: BC Managed Care – PPO | Source: Ambulatory Visit | Attending: Family Medicine | Admitting: Family Medicine

## 2013-04-19 DIAGNOSIS — M2569 Stiffness of other specified joint, not elsewhere classified: Secondary | ICD-10-CM

## 2013-04-19 NOTE — Progress Notes (Signed)
Occupational Therapy Treatment Patient Details  Name: Daniel Campos MRN: [id number] Date of Birth: 10-02-1955  Today's Date: 04/19/2013 Time: 1020-1104 OT Time Calculation (min): 44 min Manual therapy 1020-1031 11' Therapeutic exercises 6295-2841 26' Visit#: 2 of 12  Re-eval: 05/14/13       Subjective S:  Its still a little sore.  Limitations: sholder fracture with routine healing  Pain Assessment Currently in Pain?: Yes Pain Score: 3  Pain Location: Shoulder Pain Orientation: Right Pain Type: Acute pain  Precautions/Restrictions   progress as tolerated  Exercise/Treatments Supine Protraction: PROM;AAROM;10 reps Horizontal ABduction: PROM;AAROM;10 reps External Rotation: PROM;AAROM;10 reps Internal Rotation: PROM;AAROM;10 reps Flexion: PROM;AAROM;10 reps ABduction: PROM;AAROM;10 reps Seated Protraction: AAROM;10 reps Horizontal ABduction: AAROM;10 reps External Rotation: AAROM;10 reps Internal Rotation: AAROM;10 reps Flexion: AAROM;10 reps Abduction: AAROM;10 reps Standing External Rotation: Theraband;15 reps Theraband Level (Shoulder External Rotation): Level 2 (Red) Internal Rotation: Theraband;15 reps Theraband Level (Shoulder Internal Rotation): Level 2 (Red) Extension: Theraband;15 reps Theraband Level (Shoulder Extension): Level 2 (Red) Row: Theraband;15 reps Theraband Level (Shoulder Row): Level 2 (Red) Therapy Ball Right/Left: 5 reps ROM / Strengthening / Isometric Strengthening UBE (Upper Arm Bike): 3' and 3' 1.0 Wall Wash: 2' Ball on Wall: 1' with arm flexed to 90 and 1' with arm abducted to 90with abduction, patient had to keep elbow flexed as he did not have suffcient strength to maintain extenison  Flexion: Supine;3X3" Extension: Supine;3X3" External Rotation: Supine;3X3" Internal Rotation: Supine;3X3" ABduction: Supine;3X3" ADduction: Supine;3X3"     Manual Therapy Manual Therapy: Myofascial release Myofascial Release: MFR and manual  stretching to decrease pain and fascial restrictions and improve pain free mobility in Right shoulder, scapular, pectoral and upper arm region  Occupational Therapy Assessment and Plan OT Assessment and Plan Clinical Impression Statement: A:  Patient tolerated scapular stability exercies well.   OT Plan: P:  Attempt AROM in supine and seated.     Goals Home Exercise Program Pt/caregiver will Perform Home Exercise Program: For increased ROM;For increased strengthening Short Term Goals Time to Complete Short Term Goals: 3 weeks Short Term Goal 1: patient will be independent with HEP Short Term Goal 2: Patient will improve PROM in right shoulder to [facility] for increased ability to perform household chores  Short Term Goal 3: Patietn will increase right shoulder strength to 4/5 for increased ability to perform desired job duties  Short Term Goal 4: Patient will decrease night time pain in Rt shoulder to 3/10  Short Term Goal 5: Patient will decrease fascial restrictions to min in right shoulder/chest  region Long Term Goals Time to Complete Long Term Goals: 6 weeks Long Term Goal 1: Patient will return to prior level of independence with all B/IADL, work and leisure tasks utilizing RUE as dominant Long Term Goal 2: Patient will improve AROM RUE to [facility] for increased ability to reach overhead for desired job duties Long Term Goal 3: Patient will increase right shoulder strength to 4+/5 for increased ability to lift objects Long Term Goal 4: Patient will decrease pain in right shoulder to </=1/10 Long Term Goal 5: Patient will decrease fascial restrictional to trace in right shoulder/chest regions   Problem List Patient Active Problem List   Diagnosis Date Noted  . Rotator cuff tear 02/24/2013  . Stiffness of joint, not elsewhere classified, other specified site 01/16/2013  . Weakness of right leg 01/16/2013  . Fracture of right shoulder 01/04/2013  . Osteopenia 01/02/2013  . Intertrochanteric  fracture of right hip 12/20/2012  . Shoulder fracture  12/20/2012  . Essential hypertension, benign 10/16/2012  . Diabetes 08/16/2012  . Prostate hypertrophy 08/16/2012    End of Session Activity Tolerance: Patient tolerated treatment well General Behavior During Therapy: [facility] for tasks assessed/performed  GO    Shirlean Mylar, OTR/L  04/19/2013, 11:01 AM

## 2013-04-23 ENCOUNTER — Ambulatory Visit (HOSPITAL_COMMUNITY)
Admission: RE | Admit: 2013-04-23 | Discharge: 2013-04-23 | Disposition: A | Discharge status: Home / Self Care | Payer: BC Managed Care – PPO | Source: Ambulatory Visit | Attending: Family Medicine | Admitting: Family Medicine

## 2013-04-23 NOTE — Progress Notes (Signed)
Occupational Therapy Treatment Patient Details  Name: Daniel Campos MRN: [id number] Date of Birth: 1955-12-13  Today's Date: 04/23/2013 Time: 5621-3086 OT Time Calculation (min): 49 min Manual 5784-6962 (18') TherExercises 9528-4132 (31')  Visit#: 3 of 12  Re-eval: 05/14/13    Authorization: BCBS    Subjective Symptoms/Limitations Symptoms: S:  I raked leaves on Saturday so it is pretty sore.  Pain Assessment Currently in Pain?: Yes Pain Score: 5  Pain Location: Shoulder Pain Orientation: Right Pain Type: Acute pain Multiple Pain Sites: No   Exercise/Treatments Supine Protraction: PROM;10 reps;AAROM;12 reps Horizontal ABduction: PROM;10 reps;AAROM;12 reps External Rotation: PROM;10 reps;AAROM;12 reps Internal Rotation: PROM;10 reps;AAROM;12 reps Flexion: PROM;10 reps;AAROM;12 reps ABduction: PROM;10 reps;AAROM;12 reps Other Supine Exercises: overhead flexion stretch x 2 with 4# weight x each  Seated Protraction: AAROM;10 reps Horizontal ABduction: AAROM;10 reps External Rotation: AAROM;10 reps Internal Rotation: AAROM;10 reps Flexion: AAROM;10 reps Abduction: AAROM;10 reps Prone  Flexion: AROM;5 reps (supermans ) Horizontal ABduction 1:  (airplanes)    Standing External Rotation: Theraband;15 reps Theraband Level (Shoulder External Rotation): Level 2 (Red) Internal Rotation: Theraband;15 reps Theraband Level (Shoulder Internal Rotation): Level 2 (Red) Extension: Theraband;15 reps Theraband Level (Shoulder Extension): Level 2 (Red) Row: Theraband;15 reps Theraband Level (Shoulder Row): Level 2 (Red) ROM / Strengthening / Isometric Strengthening UBE (Upper Arm Bike): 3' and 3' 2.0 Flexion: Supine;3X3" Extension: Supine;3X3" External Rotation: Supine;3X3" Internal Rotation: Supine;3X3" ABduction: Supine;3X3" ADduction: Supine;3X3"     Manual Therapy Manual Therapy: Myofascial release Myofascial Release: MFR and manual stretching to decrease  pain and fascial restrictions and improve pain free mobility in Right shoulder, scapular, pectoral and upper arm region  Occupational Therapy Assessment and Plan OT Assessment and Plan Clinical Impression Statement: A:  Patient tolerated overhead stretch with weight well with report of increased stretch this date.   Noted moderate difficulty with superman stretches.  OT Plan: P:  Attempt AROM in supine and seated.     Goals Short Term Goals Short Term Goal 1: patient will be independent with HEP Short Term Goal 1 Progress: Progressing toward goal Short Term Goal 2: Patient will improve PROM in right shoulder to [facility] for increased ability to perform household chores  Short Term Goal 2 Progress: Progressing toward goal Short Term Goal 3: Patietn will increase right shoulder strength to 4/5 for increased ability to perform desired job duties  Short Term Goal 3 Progress: Progressing toward goal Short Term Goal 4: Patient will decrease night time pain in Rt shoulder to 3/10  Short Term Goal 4 Progress: Progressing toward goal Short Term Goal 5: Patient will decrease fascial restrictions to min in right shoulder/chest  region Short Term Goal 5 Progress: Progressing toward goal Long Term Goals Long Term Goal 1: Patient will return to prior level of independence with all B/IADL, work and leisure tasks utilizing RUE as dominant Long Term Goal 1 Progress: Progressing toward goal Long Term Goal 2: Patient will improve AROM RUE to [facility] for increased ability to reach overhead for desired job duties Long Term Goal 2 Progress: Progressing toward goal Long Term Goal 3: Patient will increase right shoulder strength to 4+/5 for increased ability to lift objects Long Term Goal 3 Progress: Progressing toward goal Long Term Goal 4: Patient will decrease pain in right shoulder to </=1/10 Long Term Goal 4 Progress: Progressing toward goal Long Term Goal 5: Patient will decrease fascial restrictional to trace in  right shoulder/chest regions  Long Term Goal 5 Progress: Progressing toward goal  Problem  List Patient Active Problem List   Diagnosis Date Noted  . Rotator cuff tear 02/24/2013  . Stiffness of joint, not elsewhere classified, other specified site 01/16/2013  . Weakness of right leg 01/16/2013  . Fracture of right shoulder 01/04/2013  . Osteopenia 01/02/2013  . Intertrochanteric fracture of right hip 12/20/2012  . Shoulder fracture 12/20/2012  . Essential hypertension, benign 10/16/2012  . Diabetes 08/16/2012  . Prostate hypertrophy 08/16/2012    End of Session Activity Tolerance: Patient tolerated treatment well General Behavior During Therapy: [facility] for tasks assessed/performed  GO    Velora Mediate, OTR/L  04/23/2013, 10:25 AM

## 2013-04-25 ENCOUNTER — Ambulatory Visit (HOSPITAL_COMMUNITY)
Admission: RE | Admit: 2013-04-25 | Discharge: 2013-04-25 | Disposition: A | Discharge status: Home / Self Care | Payer: BC Managed Care – PPO | Source: Ambulatory Visit | Attending: Family Medicine | Admitting: Family Medicine

## 2013-04-25 NOTE — Progress Notes (Signed)
Occupational Therapy Treatment Patient Details  Name: Daniel Campos MRN: [id number] Date of Birth: [birth year]  Today's Date: 04/25/2013 Time: 4098-1191 OT Time Calculation (min): 44 min Manual 4782-9562 (18') TherExercises 1308-6578 (26')   Visit#: 4 of 12  Re-eval: 05/14/13    Authorization: BCBS    Subjective Symptoms/Limitations Symptoms: S:  I think it is a little looser.....   Pain Assessment Currently in Pain?: Yes Pain Score: 2  Pain Location: Shoulder Pain Orientation: Right Pain Type: Acute pain Multiple Pain Sites: No       Exercise/Treatments Supine Protraction: PROM;10 reps;AAROM;12 reps Horizontal ABduction: PROM;10 reps;AAROM;12 reps External Rotation: PROM;10 reps;AAROM;12 reps Internal Rotation: PROM;10 reps;AAROM;12 reps Flexion: PROM;10 reps;AAROM;12 reps ABduction: PROM;10 reps;AAROM;12 reps Other Supine Exercises: overhead flexion stretch x 3 with 5# weight x each  Seated Protraction: AAROM;15 reps Horizontal ABduction: AAROM;15 reps External Rotation: AAROM;15 reps Internal Rotation: AAROM;15 reps Flexion: AAROM;15 reps Abduction: AAROM;15 reps   Pulleys Flexion: 2 minutes ABduction: 2 minutes Therapy Ball Right/Left: 10 reps ROM / Strengthening / Isometric Strengthening UBE (Upper Arm Bike): 3' and 3' 3.0 (with body of bike elevated for increased stretch) Flexion: Supine;3X3" Extension: Supine;3X3" External Rotation: Supine;3X3" Internal Rotation: Supine;3X3" ABduction: Supine;3X3" ADduction: Supine;3X3" Stretches Other Shoulder Stretches: doorway stretch x 5      Manual Therapy Manual Therapy: Myofascial release Myofascial Release: MFR and manual stretching to decrease pain and fascial restrictions and improve pain free mobility in Right shoulder, scapular, pectoral and upper arm region Weight Bearing Technique Weight Bearing Technique: No  Occupational Therapy Assessment and Plan OT Assessment and Plan Clinical  Impression Statement: A:  Patient states he does not feel well this date with lower blood sugar this AM of 107.  modified exercise program for sitting tasks with exception of doorway stretches.  Patient tolerated well this date.  Demos improved ROM this date, decerased shoulder tightnness.  Patient with good return demo of doorway stretch this date.  encouraged to do at home.   OT Plan: P: initiate weights for increasing strength    Goals Short Term Goals Short Term Goal 1: patient will be independent with HEP Short Term Goal 1 Progress: Progressing toward goal Short Term Goal 2: Patient will improve PROM in right shoulder to [facility] for increased ability to perform household chores  Short Term Goal 2 Progress: Progressing toward goal Short Term Goal 3: Patietn will increase right shoulder strength to 4/5 for increased ability to perform desired job duties  Short Term Goal 3 Progress: Progressing toward goal Short Term Goal 4: Patient will decrease night time pain in Rt shoulder to 3/10  Short Term Goal 4 Progress: Progressing toward goal Short Term Goal 5: Patient will decrease fascial restrictions to min in right shoulder/chest  region Short Term Goal 5 Progress: Progressing toward goal Long Term Goals Long Term Goal 1: Patient will return to prior level of independence with all B/IADL, work and leisure tasks utilizing RUE as dominant Long Term Goal 1 Progress: Progressing toward goal Long Term Goal 2: Patient will improve AROM RUE to [facility] for increased ability to reach overhead for desired job duties Long Term Goal 2 Progress: Progressing toward goal Long Term Goal 3: Patient will increase right shoulder strength to 4+/5 for increased ability to lift objects Long Term Goal 3 Progress: Progressing toward goal Long Term Goal 4: Patient will decrease pain in right shoulder to </=1/10 Long Term Goal 4 Progress: Progressing toward goal Long Term Goal 5: Patient will decrease fascial restrictional  to  trace in right shoulder/chest regions  Long Term Goal 5 Progress: Progressing toward goal  Problem List Patient Active Problem List   Diagnosis Date Noted  . Rotator cuff tear 02/24/2013  . Stiffness of joint, not elsewhere classified, other specified site 01/16/2013  . Weakness of right leg 01/16/2013  . Fracture of right shoulder 01/04/2013  . Osteopenia 01/02/2013  . Intertrochanteric fracture of right hip 12/20/2012  . Shoulder fracture 12/20/2012  . Essential hypertension, benign 10/16/2012  . Diabetes 08/16/2012  . Prostate hypertrophy 08/16/2012    End of Session Activity Tolerance: Patient tolerated treatment well General Behavior During Therapy: [facility] for tasks assessed/performed  GO    Velora Mediate, OTR/L  04/25/2013, 10:24 AM

## 2013-04-30 ENCOUNTER — Telehealth: Payer: Self-pay | Admitting: Orthopedic Surgery

## 2013-04-30 ENCOUNTER — Ambulatory Visit (HOSPITAL_COMMUNITY)
Admission: RE | Admit: 2013-04-30 | Discharge: 2013-04-30 | Disposition: A | Discharge status: Home / Self Care | Payer: BC Managed Care – PPO | Source: Ambulatory Visit | Attending: Family Medicine | Admitting: Family Medicine

## 2013-04-30 NOTE — Telephone Encounter (Signed)
Daniel Campos asked if you will call him at [phone].  He has question about the bone stimulator

## 2013-04-30 NOTE — Progress Notes (Signed)
Occupational Therapy Treatment Patient Details  Name: Daniel Campos MRN: [id number] Date of Birth: [birth year]  Today's Date: 04/30/2013 Time: 1020-1105 OT Time Calculation (min): 45 min Manual 1020-1035 (15') TherExercises 1035-1105 (30')  Visit#: 5 of 12  Re-eval: 05/14/13    Authorization: BCBS  Subjective Symptoms/Limitations Symptoms: S:  It has been popping and cracking and hurting more with my exercises.  Pain Assessment Currently in Pain?: Yes Pain Score: 4  Pain Location: Shoulder Pain Orientation: Right Pain Type: Acute pain Multiple Pain Sites: No   Exercise/Treatments Supine Protraction: PROM;10 reps;AAROM;12 reps;Weights Protraction Weight (lbs): 3 Horizontal ABduction: PROM;10 reps;AAROM;12 reps;Weights Horizontal ABduction Weight (lbs): 3 External Rotation: PROM;10 reps;AAROM;12 reps;Weights External Rotation Weight (lbs): 3 Internal Rotation: PROM;10 reps;AAROM;12 reps;Weights Internal Rotation Weight (lbs): 3 Flexion: PROM;10 reps;AAROM;12 reps;Weights Shoulder Flexion Weight (lbs): 3 ABduction: PROM;10 reps;AAROM;12 reps;Weights Shoulder ABduction Weight (lbs): 3 Seated Protraction: AROM;15 reps Horizontal ABduction: AROM;15 reps External Rotation: AROM;15 reps Internal Rotation: AROM;15 reps Flexion: AROM;15 reps Abduction: AROM;15 reps   Standing External Rotation: Theraband;15 reps Theraband Level (Shoulder External Rotation): Level 3 (Green) Internal Rotation: Theraband;15 reps Theraband Level (Shoulder Internal Rotation): Level 3 (Green) Flexion: Theraband;15 reps Theraband Level (Shoulder Flexion): Level 3 (Green) Extension: Theraband;15 reps Theraband Level (Shoulder Extension): Level 3 (Green) Row: Theraband;15 reps Theraband Level (Shoulder Row): Level 3 (Green)   ROM / Strengthening / Isometric Strengthening Cybex Press: 2 plate;10 reps Cybex Row: 2 plate;10 reps       Manual Therapy Manual Therapy: Myofascial  release Myofascial Release: MFR and manual stretching to decrease pain and fascial restrictions and improve pain free mobility in Right shoulder, scapular, pectoral and upper arm region Weight Bearing Technique Weight Bearing Technique: No  Occupational Therapy Assessment and Plan OT Assessment and Plan Clinical Impression Statement: A:  Patient arrives with increased pain this date, complaint of popping.  Demos improved range in supine plane to nearly full flexion and abduction with AAROM;  increased AROM in standing.  Patient tolerated cybex this date and increased weight for supine exercises.  also increased theraband to green with good tolerance .  No complaint of pain at end of session.  OT Plan: P:  increase theraband HEP for home; AROM in sitting/standing with weights; attempt cybex pull down.    Goals Short Term Goals Short Term Goal 1: patient will be independent with HEP Short Term Goal 1 Progress: Progressing toward goal Short Term Goal 2: Patient will improve PROM in right shoulder to [facility] for increased ability to perform household chores  Short Term Goal 2 Progress: Progressing toward goal Short Term Goal 3: Patietn will increase right shoulder strength to 4/5 for increased ability to perform desired job duties  Short Term Goal 3 Progress: Progressing toward goal Short Term Goal 4: Patient will decrease night time pain in Rt shoulder to 3/10  Short Term Goal 4 Progress: Progressing toward goal Short Term Goal 5: Patient will decrease fascial restrictions to min in right shoulder/chest  region Short Term Goal 5 Progress: Progressing toward goal Long Term Goals Long Term Goal 1: Patient will return to prior level of independence with all B/IADL, work and leisure tasks utilizing RUE as dominant Long Term Goal 1 Progress: Progressing toward goal Long Term Goal 2: Patient will improve AROM RUE to [facility] for increased ability to reach overhead for desired job duties Long Term Goal 2  Progress: Progressing toward goal Long Term Goal 3: Patient will increase right shoulder strength to 4+/5 for increased ability to lift  objects Long Term Goal 3 Progress: Progressing toward goal Long Term Goal 4: Patient will decrease pain in right shoulder to </=1/10 Long Term Goal 4 Progress: Progressing toward goal Long Term Goal 5: Patient will decrease fascial restrictional to trace in right shoulder/chest regions  Long Term Goal 5 Progress: Progressing toward goal  Problem List Patient Active Problem List   Diagnosis Date Noted  . Rotator cuff tear 02/24/2013  . Stiffness of joint, not elsewhere classified, other specified site 01/16/2013  . Weakness of right leg 01/16/2013  . Fracture of right shoulder 01/04/2013  . Osteopenia 01/02/2013  . Intertrochanteric fracture of right hip 12/20/2012  . Shoulder fracture 12/20/2012  . Essential hypertension, benign 10/16/2012  . Diabetes 08/16/2012  . Prostate hypertrophy 08/16/2012    End of Session Activity Tolerance: Patient tolerated treatment well General Behavior During Therapy: [facility] for tasks assessed/performed  GO    Velora Mediate, OTR/l  04/30/2013, 11:12 AM

## 2013-05-01 NOTE — Telephone Encounter (Signed)
Returned call to patient. He states he doesn't think he needs the bone stimulator. He said therapy has been working with strengthening and he is feeling good. States his insurance is changing the first of the year, and he doesn't want to start over having to pay another deductible.

## 2013-05-02 ENCOUNTER — Ambulatory Visit (HOSPITAL_COMMUNITY)
Admission: RE | Admit: 2013-05-02 | Discharge: 2013-05-02 | Disposition: A | Discharge status: Home / Self Care | Payer: BC Managed Care – PPO | Source: Ambulatory Visit | Attending: Family Medicine | Admitting: Family Medicine

## 2013-05-02 NOTE — Progress Notes (Signed)
Occupational Therapy Treatment Patient Details  Name: Daniel Campos MRN: [id number] Date of Birth: Mar 15, 1956  Today's Date: 05/02/2013 Time: 4098-1191 OT Time Calculation (min): 47 min Manual 4782-9562(13') TherExercises 1030-1105 (35')  Visit#: 6 of 12  Re-eval: 05/14/13    Authorization: BCBS    Subjective Symptoms/Limitations Symptoms: S:  I got some good exercises wtih making candy  Pain Assessment Currently in Pain?: Yes Pain Score: 2  Pain Location: Shoulder Pain Orientation: Right Pain Type: Acute pain Multiple Pain Sites: No  Exercise/Treatments Supine Protraction: PROM;10 reps;AAROM;15 reps;Weights Protraction Weight (lbs): 3 Horizontal ABduction: PROM;10 reps;AAROM;15 reps;Weights Horizontal ABduction Weight (lbs): 3 External Rotation: PROM;10 reps;AAROM;15 reps;Weights External Rotation Weight (lbs): 3 Internal Rotation: PROM;10 reps;AAROM;15 reps;Weights Internal Rotation Weight (lbs): 3 Flexion: PROM;10 reps;AAROM;15 reps;Weights Shoulder Flexion Weight (lbs): 3 ABduction: PROM;10 reps;AAROM;15 reps;Weights Shoulder ABduction Weight (lbs): 3 Seated Protraction: AROM;15 reps Horizontal ABduction: AROM;15 reps External Rotation: AROM;15 reps Internal Rotation: AROM;15 reps Flexion: AROM;15 reps Abduction: AROM;15 reps Standing External Rotation: Theraband;15 reps Theraband Level (Shoulder External Rotation): Level 3 (Green) Internal Rotation: Theraband;15 reps Theraband Level (Shoulder Internal Rotation): Level 3 (Green) Flexion: Theraband;15 reps Theraband Level (Shoulder Flexion): Level 3 (Green) Extension: Theraband;15 reps Theraband Level (Shoulder Extension): Level 3 (Green) Row: Theraband;15 reps Theraband Level (Shoulder Row): Level 3 (Green) ROM / Strengthening / Isometric Strengthening UBE (Upper Arm Bike): 3' and 3' 3.0 Cybex Press: 3 plate ([id number]) Cybex Row: 3 plate ([id number]) Wall Wash: up x 5, remove hand from wall hold 7  seconds suspended  Other ROM/Strengthening Exercises: cybex column pulldowns x 12reps plate 6          Occupational Therapy Assessment and Plan OT Assessment and Plan Clinical Impression Statement: A:  Patient demos increased shoulder flexion and abduction in supine plane to [facility].  Increased overhead reaching in against gravity plane tolerated this date.  Demos increased difficulty with removing hand from wall for strengthening this date.  Reports increased use with RUE for functional tasks at home  OT Plan: P:  increase theraband HEP for home; AROM in sitting/standing with weights.   Goals Short Term Goals Short Term Goal 1: patient will be independent with HEP Short Term Goal 1 Progress: Progressing toward goal Short Term Goal 2: Patient will improve PROM in right shoulder to [facility] for increased ability to perform household chores  Short Term Goal 2 Progress: Progressing toward goal Short Term Goal 3: Patietn will increase right shoulder strength to 4/5 for increased ability to perform desired job duties  Short Term Goal 3 Progress: Progressing toward goal Short Term Goal 4: Patient will decrease night time pain in Rt shoulder to 3/10  Short Term Goal 4 Progress: Progressing toward goal Short Term Goal 5: Patient will decrease fascial restrictions to min in right shoulder/chest  region Short Term Goal 5 Progress: Progressing toward goal Long Term Goals Long Term Goal 1: Patient will return to prior level of independence with all B/IADL, work and leisure tasks utilizing RUE as dominant Long Term Goal 1 Progress: Progressing toward goal Long Term Goal 2: Patient will improve AROM RUE to [facility] for increased ability to reach overhead for desired job duties Long Term Goal 2 Progress: Progressing toward goal Long Term Goal 3: Patient will increase right shoulder strength to 4+/5 for increased ability to lift objects Long Term Goal 3 Progress: Progressing toward goal Long Term Goal 4: Patient  will decrease pain in right shoulder to </=1/10 Long Term Goal 4 Progress: Progressing toward goal Long Term  Goal 5: Patient will decrease fascial restrictional to trace in right shoulder/chest regions  Long Term Goal 5 Progress: Progressing toward goal  Problem List Patient Active Problem List   Diagnosis Date Noted  . Rotator cuff tear 02/24/2013  . Stiffness of joint, not elsewhere classified, other specified site 01/16/2013  . Weakness of right leg 01/16/2013  . Fracture of right shoulder 01/04/2013  . Osteopenia 01/02/2013  . Intertrochanteric fracture of right hip 12/20/2012  . Shoulder fracture 12/20/2012  . Essential hypertension, benign 10/16/2012  . Diabetes 08/16/2012  . Prostate hypertrophy 08/16/2012    End of Session Activity Tolerance: Patient tolerated treatment well General Behavior During Therapy: [facility] for tasks assessed/performed OT Plan of Care OT Patient Instructions: instructed patient to work on wall wash with sustained suspension over head at home  GO    Velora Mediate, OTR/L 05/02/2013, 4:43 PM

## 2013-05-04 ENCOUNTER — Other Ambulatory Visit: Payer: Self-pay | Admitting: Family Medicine

## 2013-05-08 ENCOUNTER — Ambulatory Visit (HOSPITAL_COMMUNITY)
Admission: RE | Admit: 2013-05-08 | Discharge: 2013-05-08 | Disposition: A | Discharge status: Home / Self Care | Payer: BC Managed Care – PPO | Source: Ambulatory Visit | Attending: Family Medicine | Admitting: Family Medicine

## 2013-05-08 NOTE — Evaluation (Signed)
Occupational Therapy Re-Evaluation  Patient Details  Name: Daniel Campos MRN: [id number] Date of Birth: May 20, 1955  Today's Date: 05/08/2013 Time: 1023-1050 OT Time Calculation (min): 27 min Manual therapy 5621-3086 9' ROM/MMT 1032-1050 18' Visit#: 7 of 12  Re-eval: 05/08/13  Assessment Diagnosis: fracture of right shoulder with routine healing    Past Medical History:  Past Medical History  Diagnosis Date  . Diabetes mellitus without complication   . Prostate hypertrophy   . Hypertension    Past Surgical History:  Past Surgical History  Procedure Laterality Date  . Knee surgery    . Intramedullary (im) nail intertrochanteric Right 12/06/2012    Procedure: INTRAMEDULLARY (IM) NAIL INTERTROCHANTRIC;  Surgeon: Vickki Hearing, MD;  Location: [facility];  Service: Orthopedics;  Laterality: Right;  after lunch     Subjective S:  I can do about everything and think Im ready to be discharged. Special Tests: FOTO was 62/100 at initial eval and is currently 88.4  Pain Assessment Currently in Pain?: No/denies Pain Score: 0-No pain    Additional Assessments RUE AROM (degrees) RUE Overall AROM Comments: assessed in standing (initial evaluation) Right Shoulder Flexion: 120 Degrees (105) Right Shoulder ABduction: 110 Degrees (99) Right Shoulder Internal Rotation: 85 Degrees (87) Right Shoulder External Rotation: 60 Degrees (48) RUE Strength Right Shoulder Flexion: 4/5 (3/5) Right Shoulder Extension: 4/5 (4/5) Right Shoulder ABduction:  (4+/5 (3/5)) Right Shoulder Internal Rotation:  (4+/5 (3+/5)) Right Shoulder External Rotation: 5/5 (3/5)     Exercise/Treatments    Manual Therapy Manual Therapy: Myofascial release Myofascial Release: MFR and manual stretching to decrease pain and fascial restrictions and improve pain free mobility in Right shoulder, scapular, pectoral and upper arm region.  PROM to shoulder X 10 repetitions, all ranges.  Occupational Therapy  Assessment and Plan OT Assessment and Plan Clinical Impression Statement: A:  Patient has met all OT goals and is pleased with his current functional status.  He requests dc from skilled OT intervention this date.  Patient given a trial membership form for the [employer] to continue working on strenngthening.  OT Plan: P: DC from skilled OT this date.     Goals Short Term Goals Short Term Goal 1: patient will be independent with HEP Short Term Goal 1 Progress: Met Short Term Goal 2: Patient will improve PROM in right shoulder to [facility] for increased ability to perform household chores  Short Term Goal 2 Progress: Met Short Term Goal 3: Patietn will increase right shoulder strength to 4/5 for increased ability to perform desired job duties  Short Term Goal 3 Progress: Met Short Term Goal 4: Patient will decrease night time pain in Rt shoulder to 3/10  Short Term Goal 4 Progress: Met Short Term Goal 5: Patient will decrease fascial restrictions to min in right shoulder/chest  region Short Term Goal 5 Progress: Met Long Term Goals Long Term Goal 1: Patient will return to prior level of independence with all B/IADL, work and leisure tasks utilizing RUE as dominant Long Term Goal 1 Progress: Met Long Term Goal 2: Patient will improve AROM RUE to [facility] for increased ability to reach overhead for desired job duties Long Term Goal 2 Progress: Met Long Term Goal 3: Patient will increase right shoulder strength to 4+/5 for increased ability to lift objects Long Term Goal 3 Progress: Partly met Long Term Goal 4: Patient will decrease pain in right shoulder to </=1/10 Long Term Goal 4 Progress: Met Long Term Goal 5: Patient will decrease fascial restrictional  to trace in right shoulder/chest regions  Long Term Goal 5 Progress: Met  Problem List Patient Active Problem List   Diagnosis Date Noted  . Rotator cuff tear 02/24/2013  . Stiffness of joint, not elsewhere classified, other specified site 01/16/2013   . Weakness of right leg 01/16/2013  . Fracture of right shoulder 01/04/2013  . Osteopenia 01/02/2013  . Intertrochanteric fracture of right hip 12/20/2012  . Shoulder fracture 12/20/2012  . Essential hypertension, benign 10/16/2012  . Diabetes 08/16/2012  . Prostate hypertrophy 08/16/2012    End of Session Activity Tolerance: Patient tolerated treatment well General Behavior During Therapy: [facility] for tasks assessed/performed OT Plan of Care OT Home Exercise Plan: Reviewed current HEP and functional strengthening activities   GO    Shirlean Mylar, OTR/L  05/08/2013, 10:56 AM  Physician Documentation Your signature is required to indicate approval of the treatment plan as stated above.  Please sign and either send electronically or make a copy of this report for your files and return this physician signed original.  Please mark one 1.__approve of plan  2. ___approve of plan with the following conditions.   ______________________________                                                          _____________________ Physician Signature                                                                                                             Date

## 2013-05-15 ENCOUNTER — Ambulatory Visit (HOSPITAL_COMMUNITY): Payer: BC Managed Care – PPO | Admitting: Occupational Therapy

## 2013-05-18 ENCOUNTER — Ambulatory Visit (HOSPITAL_COMMUNITY): Payer: BC Managed Care – PPO | Admitting: Occupational Therapy

## 2013-05-21 ENCOUNTER — Ambulatory Visit (HOSPITAL_COMMUNITY): Payer: BC Managed Care – PPO | Admitting: Occupational Therapy

## 2013-05-23 ENCOUNTER — Ambulatory Visit (HOSPITAL_COMMUNITY): Payer: BC Managed Care – PPO | Admitting: Occupational Therapy

## 2013-07-13 ENCOUNTER — Telehealth: Payer: Self-pay | Admitting: Orthopedic Surgery

## 2013-07-13 NOTE — Telephone Encounter (Signed)
On 07/06/13, notes were faxed as per request, and per signed authorization through last date of service, 03/19/13, and last telephone note 04/30/14, to [facility], including functional capacities form, to Fax # [phone], ph# [phone], to attention Alton Revere, long-term disability case manager.  On 07/13/13, faxed additional response to request, that there are no additional records after dates noted, sent to same fax #.

## 2013-07-18 ENCOUNTER — Other Ambulatory Visit: Payer: Self-pay | Admitting: Family Medicine

## 2013-08-14 ENCOUNTER — Ambulatory Visit (INDEPENDENT_AMBULATORY_CARE_PROVIDER_SITE_OTHER): Payer: BC Managed Care – PPO | Admitting: Orthopedic Surgery

## 2013-08-14 VITALS — BP 128/69 | Ht 71.0 in | Wt 147.0 lb

## 2013-08-14 DIAGNOSIS — M25519 Pain in unspecified shoulder: Secondary | ICD-10-CM

## 2013-08-14 MED ORDER — HYDROCODONE-ACETAMINOPHEN 5-325 MG PO TABS: 1.0000 | ORAL_TABLET | Freq: Four times a day (QID) | ORAL | Status: DC | PRN

## 2013-08-14 NOTE — Progress Notes (Signed)
Patient ID: Daniel Campos, male   DOB: [birth year], 58 y.o.   MRN: [id number] Chief complaint status post right proximal humerus fracture treated closed and status post hip fracture treated with internal fixation  The patient struggled with range of motion in the right shoulder has regained functional but not full range of motion compared to his left side  He has an aching in the shoulder which he takes Vicodin on an as-needed basis he is now on disability  He is improving gradually.  System review is otherwise negative  BP 128/69  Ht 5\' 11"  (1.803 m)  Wt 147 lb (66.679 kg)  BMI 20.51 kg/m2 General appearance is normal, the patient is alert and oriented x3 with normal mood and affect. Forward elevation is 120 on the right versus 180 on the left his cuff strength is returned to normal neurovascular exam is intact  Right proximal humerus fracture Internal fixation right hip  Doing well followup as needed  Meds ordered this encounter  Medications  . HYDROcodone-acetaminophen (NORCO) 5-325 MG per tablet    Sig: Take 1 tablet by mouth every 6 (six) hours as needed for moderate pain.    Dispense:  120 tablet    Refill:  0

## 2013-08-14 NOTE — Patient Instructions (Addendum)
Activities as tolerated   Call for refill on medication

## 2013-08-26 ENCOUNTER — Other Ambulatory Visit: Payer: Self-pay | Admitting: Family Medicine

## 2013-09-07 ENCOUNTER — Encounter: Payer: Self-pay | Admitting: Family Medicine

## 2013-09-07 ENCOUNTER — Ambulatory Visit (INDEPENDENT_AMBULATORY_CARE_PROVIDER_SITE_OTHER): Payer: BC Managed Care – PPO | Admitting: Family Medicine

## 2013-09-07 VITALS — BP 112/64 | Ht 71.0 in | Wt 145.0 lb

## 2013-09-07 DIAGNOSIS — E119 Type 2 diabetes mellitus without complications: Secondary | ICD-10-CM

## 2013-09-07 DIAGNOSIS — M8580 Other specified disorders of bone density and structure, unspecified site: Secondary | ICD-10-CM

## 2013-09-07 DIAGNOSIS — I10 Essential (primary) hypertension: Secondary | ICD-10-CM

## 2013-09-07 DIAGNOSIS — Z125 Encounter for screening for malignant neoplasm of prostate: Secondary | ICD-10-CM

## 2013-09-07 DIAGNOSIS — N400 Enlarged prostate without lower urinary tract symptoms: Secondary | ICD-10-CM

## 2013-09-07 DIAGNOSIS — M949 Disorder of cartilage, unspecified: Secondary | ICD-10-CM

## 2013-09-07 DIAGNOSIS — M899 Disorder of bone, unspecified: Secondary | ICD-10-CM

## 2013-09-07 MED ORDER — ENALAPRIL MALEATE 10 MG PO TABS: ORAL_TABLET | ORAL | Status: DC

## 2013-09-07 MED ORDER — TAMSULOSIN HCL 0.4 MG PO CAPS: 0.4000 mg | ORAL_CAPSULE | Freq: Every day | ORAL | Status: DC

## 2013-09-07 MED ORDER — ALENDRONATE SODIUM 70 MG PO TABS: 70.0000 mg | ORAL_TABLET | ORAL | Status: DC

## 2013-09-07 NOTE — Progress Notes (Signed)
   Subjective:    Patient ID: Daniel Campos, male    DOB: 15-Jan-1956, 58 y.o.   MRN: [id number]  HPI Patient is here today for a check up.  He needs a refill on his enalapril. Patient is compliant with his blood pressure medicine. No obvious side effects from it. Trying to watch his salt intake. Admits that he is not exercising very much.  Reports ongoing difficulties urinating. Difficulties hesitation tendon day. Also up a couple times each night. Frustrating to him. Took Flomax at one time at first he thought he had side effects but now he's not so sure.  Patient has history of confirmed osteoporosis. We initiated Fosamax. Patient cannot recall why he has stopped taking it.  He states he feels good. Blood sugars are under control. He sees a diabetic specialist.  No concerns.     Review of Systems No chest pain no back pain no abdominal pain no change in bowel habits no blood in stool no blood in urine ROS otherwise negative    Objective:   Physical Exam Alert no apparent distress. HEENT normal neck supple. Lungs clear. Heart regular in rhythm. Blood pressure 136/80 4 repeat.       Assessment & Plan:  Impression 1 hypertension good control. #2 osteoporosis. With noncompliance. Reason is very unclear. #3 prostate hypertrophy and increased urination. Plan resume Flomax therapeutic trial recommended. Resume Fosamax. Rationale discussed. Maintain same blood pressure medicine. Diet exercise discussed. Continue close followup with Dr. Dorris Fetch 4 diabetes [facility]

## 2013-09-26 LAB — PSA: PSA: 0.64 ng/mL (ref <=4.00)

## 2013-09-27 NOTE — Progress Notes (Signed)
This encounter was created in error - please disregard.

## 2013-09-30 ENCOUNTER — Encounter: Payer: Self-pay | Admitting: Family Medicine

## 2013-12-27 ENCOUNTER — Telehealth: Payer: Self-pay | Admitting: Family Medicine

## 2013-12-27 NOTE — Telephone Encounter (Signed)
Please verify with the patient that he is using 10 mg Vasotec daily.( Enalopril) if this is the case I would recommend re\re prescribing a lower dose. Vasotec 5 mg 1 daily. #30, 5 refills, cancel 10 mg dose, should followup with Dr. Richardson Landry within 3 months. Let us know if blood pressures continue to stay low

## 2013-12-27 NOTE — Telephone Encounter (Signed)
Discussed with patient

## 2013-12-27 NOTE — Telephone Encounter (Signed)
#  1 stop taking medication #2 check blood pressure once every couple days. Keep written record of the blood pressure. #3 followup with Dr. Richardson Landry within 2-3 weeks

## 2013-12-27 NOTE — Telephone Encounter (Signed)
Patient said that his blood pressure has been running around 100/60 when he takes his BP medication, but this morning he has not taken the medication and it is running 150/90. He wants to know if he should stop?

## 2013-12-27 NOTE — Telephone Encounter (Signed)
Pt states [facility] changed enalapril to 2.5 mg and pt has been cutting that in half. Today didn't take any BP 116/86. Pt wants to know if he should stop taking med.

## 2014-02-05 ENCOUNTER — Encounter: Payer: Self-pay | Admitting: *Deleted

## 2014-02-05 LAB — HEMOGLOBIN A1C: A1c: 8.6

## 2014-05-08 ENCOUNTER — Encounter: Payer: Self-pay | Admitting: Family Medicine

## 2014-05-08 ENCOUNTER — Ambulatory Visit (INDEPENDENT_AMBULATORY_CARE_PROVIDER_SITE_OTHER): Payer: BC Managed Care – PPO | Admitting: Family Medicine

## 2014-05-08 VITALS — BP 128/70 | Ht 71.0 in | Wt 148.4 lb

## 2014-05-08 DIAGNOSIS — I10 Essential (primary) hypertension: Secondary | ICD-10-CM

## 2014-05-08 DIAGNOSIS — M8580 Other specified disorders of bone density and structure, unspecified site: Secondary | ICD-10-CM

## 2014-05-08 DIAGNOSIS — N400 Enlarged prostate without lower urinary tract symptoms: Secondary | ICD-10-CM

## 2014-05-08 DIAGNOSIS — Z23 Encounter for immunization: Secondary | ICD-10-CM

## 2014-05-08 NOTE — Progress Notes (Addendum)
   Subjective:    Patient ID: Daniel Campos, male    DOB: [birth year], 58 y.o.   MRN: [id number]  Hypertension This is a chronic problem. The current episode started more than 1 year ago. The problem has been gradually improving since onset. The problem is controlled. There are no associated agents to hypertension. There are no known risk factors for coronary artery disease. Past treatments include nothing. The current treatment provides significant improvement. There are no compliance problems.   Patient sees a diabetic specialist.  Patient states that he has no concerns at this time.   Cuts wood and stays active   freq urin at night, the med didn't help. Patient took the Flomax. It did not help things. Now has stopped.  History of osteoporosis. Compliant with Fosamax. No obvious side effects in this regard.  Blood pressure was too low so patient came off his blood pressure medication. Numbers still running good when checked elsewhere.  Developed a sudden scleral hemorrhage in left eye this week. Had an injection of his eye from a specialist last week. Review of Systems No headache no chest pain no back pain no abdominal pain no change in bowel habits no blood in stool    Objective:   Physical Exam  Alert no apparent distress blood pressure 130/70 on repeat HEENT left ocular scleral hemorrhage. Otherwise normal neck supple lungs clear. Heart regular in rhythm. Ankles without edema.      Assessment & Plan:  Impression 1 hypertension good control off meds #2 prostate hypertrophy discussed at length. Not ideal but patient does not want to try another agent at this time. Discussed future options #3 scleral hemorrhage discussed reassured #4 osteoporosis ongoing maintain medications. Plan diet exercise discussed. Medicines refilled. Recheck in 6 months for preventive exam. [facility]

## 2014-05-08 NOTE — Addendum Note (Signed)
Addended by: Margaretmary Eddy on: 05/08/2014 08:57 AM   Modules accepted: Level of Service

## 2015-05-02 ENCOUNTER — Emergency Department (HOSPITAL_COMMUNITY): Payer: No Typology Code available for payment source

## 2015-05-02 ENCOUNTER — Emergency Department (HOSPITAL_COMMUNITY)
Admission: EM | Admit: 2015-05-02 | Discharge: 2015-05-02 | Disposition: A | Discharge status: Home / Self Care | Payer: No Typology Code available for payment source | Attending: Emergency Medicine | Admitting: Emergency Medicine

## 2015-05-02 ENCOUNTER — Encounter (HOSPITAL_COMMUNITY): Payer: Self-pay | Admitting: Cardiology

## 2015-05-02 DIAGNOSIS — Y9389 Activity, other specified: Secondary | ICD-10-CM | POA: Insufficient documentation

## 2015-05-02 DIAGNOSIS — E119 Type 2 diabetes mellitus without complications: Secondary | ICD-10-CM | POA: Insufficient documentation

## 2015-05-02 DIAGNOSIS — Z7982 Long term (current) use of aspirin: Secondary | ICD-10-CM | POA: Insufficient documentation

## 2015-05-02 DIAGNOSIS — S199XXA Unspecified injury of neck, initial encounter: Secondary | ICD-10-CM | POA: Insufficient documentation

## 2015-05-02 DIAGNOSIS — Z79899 Other long term (current) drug therapy: Secondary | ICD-10-CM | POA: Diagnosis not present

## 2015-05-02 DIAGNOSIS — S299XXA Unspecified injury of thorax, initial encounter: Secondary | ICD-10-CM | POA: Diagnosis not present

## 2015-05-02 DIAGNOSIS — Z794 Long term (current) use of insulin: Secondary | ICD-10-CM | POA: Diagnosis not present

## 2015-05-02 DIAGNOSIS — Z87438 Personal history of other diseases of male genital organs: Secondary | ICD-10-CM | POA: Insufficient documentation

## 2015-05-02 DIAGNOSIS — Y999 Unspecified external cause status: Secondary | ICD-10-CM | POA: Insufficient documentation

## 2015-05-02 DIAGNOSIS — Y92410 Unspecified street and highway as the place of occurrence of the external cause: Secondary | ICD-10-CM | POA: Insufficient documentation

## 2015-05-02 DIAGNOSIS — R1010 Upper abdominal pain, unspecified: Secondary | ICD-10-CM | POA: Insufficient documentation

## 2015-05-02 DIAGNOSIS — I10 Essential (primary) hypertension: Secondary | ICD-10-CM | POA: Insufficient documentation

## 2015-05-02 DIAGNOSIS — F17210 Nicotine dependence, cigarettes, uncomplicated: Secondary | ICD-10-CM | POA: Diagnosis not present

## 2015-05-02 LAB — I-STAT CHEM 8, ED
BUN: 23 mg/dL — AB (ref 6–20)
CHLORIDE: 98 mmol/L — AB (ref 101–111)
CREATININE: 0.8 mg/dL (ref 0.61–1.24)
Calcium, Ion: 1.16 mmol/L (ref 1.13–1.30)
GLUCOSE: 265 mg/dL — AB (ref 65–99)
HEMATOCRIT: 31 % — AB (ref 39.0–52.0)
HEMOGLOBIN: 10.5 g/dL — AB (ref 13.0–17.0)
POTASSIUM: 5 mmol/L (ref 3.5–5.1)
Sodium: 135 mmol/L (ref 135–145)
TCO2: 27 mmol/L (ref 0–100)

## 2015-05-02 MED ORDER — CYCLOBENZAPRINE HCL 5 MG PO TABS: 5.0000 mg | ORAL_TABLET | Freq: Three times a day (TID) | ORAL | Status: DC | PRN

## 2015-05-02 MED ORDER — FENTANYL CITRATE (PF) 100 MCG/2ML IJ SOLN
25.0000 ug | Freq: Once | INTRAMUSCULAR | Status: AC
  Administered 2015-05-02: 25 ug via INTRAVENOUS
  Filled 2015-05-02: qty 2

## 2015-05-02 MED ORDER — IOHEXOL 300 MG/ML  SOLN
100.0000 mL | Freq: Once | INTRAMUSCULAR | Status: AC | PRN
  Administered 2015-05-02: 100 mL via INTRAVENOUS

## 2015-05-02 MED ORDER — HYDROCODONE-ACETAMINOPHEN 5-325 MG PO TABS
2.0000 | ORAL_TABLET | ORAL | Status: DC | PRN
Start: 2015-05-02 — End: 2015-05-06
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
## 2015-05-02 NOTE — Discharge Instructions (Signed)

## 2015-05-02 NOTE — ED Notes (Signed)
Pt made aware to return if symptoms worsen or if any life threatening symptoms occur.   
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
## 2015-05-06 ENCOUNTER — Emergency Department (HOSPITAL_COMMUNITY): Payer: No Typology Code available for payment source

## 2015-05-06 ENCOUNTER — Encounter (HOSPITAL_COMMUNITY): Payer: Self-pay | Admitting: Emergency Medicine

## 2015-05-06 ENCOUNTER — Emergency Department (HOSPITAL_COMMUNITY)
Admission: EM | Admit: 2015-05-06 | Discharge: 2015-05-06 | Disposition: A | Discharge status: Home / Self Care | Payer: No Typology Code available for payment source | Attending: Emergency Medicine | Admitting: Emergency Medicine

## 2015-05-06 DIAGNOSIS — S8391XA Sprain of unspecified site of right knee, initial encounter: Secondary | ICD-10-CM | POA: Diagnosis not present

## 2015-05-06 DIAGNOSIS — Z87438 Personal history of other diseases of male genital organs: Secondary | ICD-10-CM | POA: Insufficient documentation

## 2015-05-06 DIAGNOSIS — Y92410 Unspecified street and highway as the place of occurrence of the external cause: Secondary | ICD-10-CM | POA: Diagnosis not present

## 2015-05-06 DIAGNOSIS — Y9389 Activity, other specified: Secondary | ICD-10-CM | POA: Diagnosis not present

## 2015-05-06 DIAGNOSIS — Z79899 Other long term (current) drug therapy: Secondary | ICD-10-CM | POA: Insufficient documentation

## 2015-05-06 DIAGNOSIS — I10 Essential (primary) hypertension: Secondary | ICD-10-CM | POA: Insufficient documentation

## 2015-05-06 DIAGNOSIS — F17210 Nicotine dependence, cigarettes, uncomplicated: Secondary | ICD-10-CM | POA: Insufficient documentation

## 2015-05-06 DIAGNOSIS — Z7982 Long term (current) use of aspirin: Secondary | ICD-10-CM | POA: Insufficient documentation

## 2015-05-06 DIAGNOSIS — Z794 Long term (current) use of insulin: Secondary | ICD-10-CM | POA: Insufficient documentation

## 2015-05-06 DIAGNOSIS — S8991XA Unspecified injury of right lower leg, initial encounter: Secondary | ICD-10-CM | POA: Diagnosis present

## 2015-05-06 DIAGNOSIS — E119 Type 2 diabetes mellitus without complications: Secondary | ICD-10-CM | POA: Insufficient documentation

## 2015-05-06 DIAGNOSIS — Y998 Other external cause status: Secondary | ICD-10-CM | POA: Insufficient documentation

## 2015-05-06 MED ORDER — NAPROXEN 500 MG PO TABS: 500.0000 mg | ORAL_TABLET | Freq: Two times a day (BID) | ORAL | Status: DC

## 2015-05-06 MED ORDER — HYDROCODONE-ACETAMINOPHEN 5-325 MG PO TABS: ORAL_TABLET | ORAL | Status: DC
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
## 2015-05-06 NOTE — Discharge Instructions (Signed)
Knee Sprain A knee sprain is a tear in the strong bands of tissue that connect the bones (ligaments) of your knee. HOME CARE  Raise (elevate) your injured knee to lessen puffiness (swelling).  To ease pain and puffiness, put ice on the injured area.  Put ice in a plastic bag.  Place a towel between your skin and the bag.  Leave the ice on for 20 minutes, 2-3 times a day.  Only take medicine as told by your doctor.  Do not leave your knee unprotected until pain and stiffness go away (usually 4-6 weeks).  If you have a cast or splint, do not get it wet. If your doctor told you to not take it off, cover it with a plastic bag when you shower or bathe. Do not swim.  Your doctor may have you do exercises to prevent or limit permanent weakness and stiffness. GET HELP RIGHT AWAY IF:   Your cast or splint becomes damaged.  Your pain gets worse.  You have a lot of pain, puffiness, or numbness below the cast or splint. MAKE SURE YOU:   Understand these instructions.  Will watch your condition.  Will get help right away if you are not doing well or get worse.   This information is not intended to replace advice given to you by your health care provider. Make sure you discuss any questions you have with your health care provider.   Document Released: 04/21/2009 Document Revised: 05/08/2013 Document Reviewed: 01/09/2013 Elsevier Interactive Patient Education [employer].

## 2015-05-22 ENCOUNTER — Ambulatory Visit: Payer: Self-pay | Admitting: Orthopedic Surgery

## 2016-04-13 ENCOUNTER — Encounter: Payer: Self-pay | Admitting: Internal Medicine

## 2016-04-29 ENCOUNTER — Ambulatory Visit (INDEPENDENT_AMBULATORY_CARE_PROVIDER_SITE_OTHER): Payer: Non-veteran care | Admitting: Gastroenterology

## 2016-04-29 ENCOUNTER — Encounter: Payer: Self-pay | Admitting: Gastroenterology

## 2016-04-29 ENCOUNTER — Other Ambulatory Visit: Payer: Self-pay

## 2016-04-29 DIAGNOSIS — Z1211 Encounter for screening for malignant neoplasm of colon: Secondary | ICD-10-CM

## 2016-04-29 MED ORDER — DICYCLOMINE HCL 10 MG PO CAPS: ORAL_CAPSULE | ORAL | 3 refills | Status: DC

## 2016-04-29 NOTE — Progress Notes (Signed)
Primary Care Physician:  [facility] Primary Gastroenterologist:  Dr. Gala Romney   Chief Complaint  Patient presents with  . Colonoscopy    HPI:   Daniel Campos is a 60 y.o. male presenting today at the request of the [place] for a colonoscopy. No prior colonoscopy. No rectal bleeding. No unexplained weight loss or lack of appetite. He did lose weight when first diagnosed with diabetes. No upper GI symptoms.    Notes that he has had chronic episodes of intermittent bouts with diarrhea about once a week-2 weeks, usually at night. Otherwise, normal BMs. Will have bad gas, out it comes, and then that is it. Associated abdominal discomfort, then relieved thereafter.   Past Medical History:  Diagnosis Date  . Diabetes mellitus without complication ([place])   . Hypertension   . Prostate hypertrophy     Past Surgical History:  Procedure Laterality Date  . INTRAMEDULLARY (IM) NAIL INTERTROCHANTERIC Right 12/06/2012   Procedure: INTRAMEDULLARY (IM) NAIL INTERTROCHANTRIC;  Surgeon: Carole Civil, MD;  Location: [facility];  Service: Orthopedics;  Laterality: Right;  after lunch   . KNEE SURGERY    . SHOULDER SURGERY      Current Outpatient Prescriptions  Medication Sig Dispense Refill  . alendronate (FOSAMAX) 70 MG tablet Take 1 tablet (70 mg total) by mouth every 7 (seven) days. Take with a full glass of water on an empty stomach. 4 tablet 11  . aspirin 81 MG tablet Take 81 mg by mouth daily.    . insulin aspart protamine- aspart (NOVOLOG MIX 70/30) (70-30) 100 UNIT/ML injection Inject into the skin. Reported on 05/02/2015    . insulin detemir (LEVEMIR) 100 UNIT/ML injection Inject 22 Units into the skin at bedtime.     . Melatonin 10 MG TABS Take 10 mg by mouth at bedtime.    Marland Kitchen NOVOLOG FLEXPEN 100 UNIT/ML FlexPen 3 Units.     Daryll Brod CONFIRM/MICRO TEST test strip USE ONE STRIP TO CHECK GLUCOSE 3 TO 4 TIMES DAILY 100 each 5  . dicyclomine (BENTYL) 10 MG capsule 10 mg every 4  hours as needed for abdominal cramping and diarrhea. 120 capsule 3   No current facility-administered medications for this visit.     Allergies as of 04/29/2016  . (No Known Allergies)    Family History  Problem Relation Age of Onset  . Heart attack Mother   . Colon polyps Neg Hx   . Colon cancer Neg Hx     Social History   Social History  . Marital status: Widowed    Spouse name: N/A  . Number of children: N/A  . Years of education: N/A   Occupational History  . Not on file.   Social History Main Topics  . Smoking status: Former Smoker    Packs/day: 1.00    Types: E-cigarettes  . Smokeless tobacco: Not on file  . Alcohol use No  . Drug use: No  . Sexual activity: Not on file   Other Topics Concern  . Not on file   Social History Narrative  . No narrative on file    Review of Systems: Gen: see HPI  CV: Denies chest pain, heart palpitations, peripheral edema, syncope.  Resp: Denies shortness of breath at rest or with exertion. Denies wheezing or cough.  GI: see HPI  GU : Denies urinary burning, urinary frequency, urinary hesitancy MS: +muscle aches/stiffness Derm: Denies rash, itching, dry skin Psych: Denies depression, anxiety, memory loss,  and confusion Heme: see HPI   Physical Exam: BP 106/67   Pulse 86   Temp 97.3 F (36.3 C) (Oral)   Ht 5\' 11"  (1.803 m)   Wt 160 lb 3.2 oz (72.7 kg)   BMI 22.34 kg/m  General:   Alert and oriented. Pleasant and cooperative. Well-nourished and well-developed.  Head:  Normocephalic and atraumatic. Eyes:  Without icterus, sclera clear and conjunctiva pink.  Ears:  Normal auditory acuity. Nose:  No deformity, discharge,  or lesions. Mouth:  No deformity or lesions, oral mucosa pink.  Lungs:  Clear to auscultation bilaterally. No wheezes, rales, or rhonchi. No distress.  Heart:  S1, S2 present without murmurs appreciated.  Abdomen:  +BS, soft, non-tender and non-distended. No HSM noted. No guarding or rebound. No  masses appreciated.  Rectal:  Deferred  Msk:  Symmetrical without gross deformities. Normal posture. Extremities:  Without  edema. Neurologic:  Alert and  oriented x4;  grossly normal neurologically. Psych:  Alert and cooperative. Normal mood and affect.

## 2016-04-29 NOTE — Patient Instructions (Signed)
Try to keep a food diary to see when your stomach gets upset.   I have sent in Bentyl to take 1 capsule just as needed for abdominal cramping and diarrhea. Do not take this every day, as it can cause constipation.    We have scheduled you for a screening colonoscopy with Dr. Gala Romney.

## 2016-04-30 NOTE — Assessment & Plan Note (Addendum)
60 year old male with no prior colonoscopy, with need for initial screening. Incidentally notes long-standing issue of occasional dairrhea that is self-limiting every few weeks, resolving quickly. No rectal bleeding or other concerning signs. Will provide Bentyl for supportive measures prn and recommended keeping a food diary to see if any triggers.   Proceed with TCS with Dr. Gala Romney in near future: the risks, benefits, and alternatives have been discussed with the patient in detail. The patient states understanding and desires to proceed.

## 2016-05-04 NOTE — Progress Notes (Signed)
CC'D TO PCP °

## 2016-05-17 DIAGNOSIS — I2699 Other pulmonary embolism without acute cor pulmonale: Secondary | ICD-10-CM

## 2016-05-17 HISTORY — DX: Other pulmonary embolism without acute cor pulmonale: I26.99

## 2016-05-18 ENCOUNTER — Telehealth: Payer: Self-pay | Admitting: Internal Medicine

## 2016-05-18 NOTE — Telephone Encounter (Signed)
Noted  

## 2016-05-18 NOTE — Telephone Encounter (Signed)
Pt called this morning to cancel his procedure for Thursday with [facility]. He has the flu and will call back to reschedule later.

## 2016-05-20 ENCOUNTER — Ambulatory Visit (HOSPITAL_COMMUNITY): Admission: RE | Admit: 2016-05-20 | Payer: Non-veteran care | Source: Ambulatory Visit | Admitting: Internal Medicine

## 2016-05-20 ENCOUNTER — Encounter (HOSPITAL_COMMUNITY): Admission: RE | Payer: Self-pay | Source: Ambulatory Visit

## 2016-05-20 SURGERY — COLONOSCOPY
Anesthesia: Moderate Sedation

## 2016-08-31 ENCOUNTER — Other Ambulatory Visit: Payer: Self-pay

## 2016-08-31 ENCOUNTER — Ambulatory Visit (INDEPENDENT_AMBULATORY_CARE_PROVIDER_SITE_OTHER): Payer: Non-veteran care | Admitting: Gastroenterology

## 2016-08-31 ENCOUNTER — Encounter: Payer: Self-pay | Admitting: Gastroenterology

## 2016-08-31 VITALS — BP 127/82 | HR 82 | Temp 97.6°F | Ht 71.0 in | Wt 168.8 lb

## 2016-08-31 DIAGNOSIS — Z1211 Encounter for screening for malignant neoplasm of colon: Secondary | ICD-10-CM | POA: Diagnosis not present

## 2016-08-31 DIAGNOSIS — R195 Other fecal abnormalities: Secondary | ICD-10-CM | POA: Insufficient documentation

## 2016-08-31 MED ORDER — DICYCLOMINE HCL 10 MG PO CAPS: ORAL_CAPSULE | ORAL | 3 refills | Status: DC

## 2016-08-31 MED ORDER — PEG 3350-KCL-NA BICARB-NACL 420 G PO SOLR: 4000.0000 mL | ORAL | 0 refills | Status: DC

## 2016-08-31 NOTE — Progress Notes (Signed)
cc'ed to pcp °

## 2016-08-31 NOTE — Patient Instructions (Addendum)
1. Colonoscopy as scheduled. See separate instructions.  

## 2016-08-31 NOTE — Progress Notes (Signed)
Primary Care Physician:  [facility]  Primary Gastroenterologist:  Garfield Cornea, MD   Chief Complaint  Patient presents with  . Colonoscopy    HPI:  Daniel Campos is a 61 y.o. male here to reschedule his colonoscopy at the request of [facility]. Patient was scheduled earlier this year but had to cancel because he had the flu. He has never had a colonoscopy. History of chronic episodes of intermittent bouts with diarrhea about once a week every 2 weeks usually at night. No melena or rectal bleeding. He was given bentyl at last OV which helps a lot. Takes about wice per week if he feels his stomach get "nervous". Bouts of diarrhea very short lived with bentyl, maybe single episode. No ugi symptoms outside of occasional heartburn. No abdominal pain.  He's concerned about his sugars dropping too low during bowel prep. He did a mock trial with clear liquid diet, adjusting his Levemir and NovoLog by 50% as previously recommended and states he got up several times in the middle of night with low sugars. Wants an early morning procedure as well.  Current Outpatient Prescriptions  Medication Sig Dispense Refill  . aspirin 81 MG tablet Take 81 mg by mouth daily.    Marland Kitchen dicyclomine (BENTYL) 10 MG capsule 10 mg every 4 hours as needed for abdominal cramping and diarrhea. 120 capsule 3  . insulin detemir (LEVEMIR) 100 UNIT/ML injection Inject 26 Units into the skin at bedtime.     . Melatonin 10 MG TABS Take 10 mg by mouth at bedtime.    Marland Kitchen NOVOLOG FLEXPEN 100 UNIT/ML FlexPen Inject 5 Units into the skin 3 (three) times daily with meals. 9 units at dinner time only     No current facility-administered medications for this visit.     Allergies as of 08/31/2016  . (No Known Allergies)    Past Medical History:  Diagnosis Date  . Diabetes mellitus without complication ([place])   . Hypertension   . Prostate hypertrophy     Past Surgical History:  Procedure Laterality Date   . INTRAMEDULLARY (IM) NAIL INTERTROCHANTERIC Right 12/06/2012   Procedure: INTRAMEDULLARY (IM) NAIL INTERTROCHANTRIC;  Surgeon: Carole Civil, MD;  Location: [facility];  Service: Orthopedics;  Laterality: Right;  after lunch   . KNEE SURGERY    . SHOULDER SURGERY      Family History  Problem Relation Age of Onset  . Heart attack Mother   . Colon polyps Neg Hx   . Colon cancer Neg Hx     Social History   Social History  . Marital status: Widowed    Spouse name: N/A  . Number of children: N/A  . Years of education: N/A   Occupational History  . Not on file.   Social History Main Topics  . Smoking status: Former Smoker    Packs/day: 1.00    Types: E-cigarettes  . Smokeless tobacco: Never Used  . Alcohol use No  . Drug use: No  . Sexual activity: Not on file   Other Topics Concern  . Not on file   Social History Narrative  . No narrative on file      ROS:  General: Negative for anorexia, weight loss, fever, chills, fatigue, weakness. Eyes: Negative for vision changes.  ENT: Negative for hoarseness, difficulty swallowing , nasal congestion. CV: Negative for chest pain, angina, palpitations, dyspnea on exertion, peripheral edema.  Respiratory: Negative for dyspnea at rest, dyspnea on exertion, cough, sputum,  wheezing.  GI: See history of present illness. GU:  Negative for dysuria, hematuria, urinary incontinence, urinary frequency, nocturnal urination.  MS: Negative for joint pain, low back pain.  Derm: Negative for rash or itching.  Neuro: Negative for weakness, abnormal sensation, seizure, frequent headaches, memory loss, confusion.  Psych: Negative for anxiety, depression, suicidal ideation, hallucinations.  Endo: Negative for unusual weight change.  Heme: Negative for bruising or bleeding. Allergy: Negative for rash or hives.    Physical Examination:  BP 127/82   Pulse 82   Temp 97.6 F (36.4 C) (Oral)   Ht 5\' 11"  (1.803 m)   Wt 168 lb 12.8 oz (76.6  kg)   BMI 23.54 kg/m    General: Well-nourished, well-developed in no acute distress.  Head: Normocephalic, atraumatic.   Eyes: Conjunctiva pink, no icterus. Mouth: Oropharyngeal mucosa moist and pink , no lesions erythema or exudate. Neck: Supple without thyromegaly, masses, or lymphadenopathy.  Lungs: Clear to auscultation bilaterally.  Heart: Regular rate and rhythm, no murmurs rubs or gallops.  Abdomen: Bowel sounds are normal, nontender, nondistended, no hepatosplenomegaly or masses, no abdominal bruits or hernia , no rebound or guarding.   Rectal: Not performed Extremities: No lower extremity edema. No clubbing or deformities.  Neuro: Alert and oriented x 4 , grossly normal neurologically.  Skin: Warm and dry, no rash or jaundice.   Psych: Alert and cooperative, normal mood and affect.

## 2016-08-31 NOTE — Assessment & Plan Note (Signed)
61 year old male with no prior colonoscopy presenting for initial screening. History of intermittent bouts of occasional diarrhea which is self-limiting, responded nicely to Bentyl when necessary. Plans for colonoscopy in the near future.  I have discussed the risks, alternatives, benefits with regards to but not limited to the risk of reaction to medication, bleeding, infection, perforation and the patient is agreeable to proceed. Written consent to be obtained.  Patient voiced concerns about hypoglycemia with fasting. We have adjusted insulin. Encouraged patient to also discuss with his endocrinologist for any recommendations.

## 2016-09-30 ENCOUNTER — Telehealth: Payer: Self-pay

## 2016-09-30 NOTE — Telephone Encounter (Signed)
Pt called office and LMOVM that he was canceling colonoscopy scheduled for 10/06/16. He didn't give a reason. Procedure canceled. LMOVM and informed Endo Scheduler.  Routing to LSL as FYI.

## 2016-10-01 NOTE — Telephone Encounter (Signed)
Noted  

## 2016-10-06 ENCOUNTER — Ambulatory Visit (HOSPITAL_COMMUNITY): Admit: 2016-10-06 | Payer: No Typology Code available for payment source | Admitting: Internal Medicine

## 2016-10-06 ENCOUNTER — Encounter (HOSPITAL_COMMUNITY): Payer: Self-pay

## 2016-10-06 SURGERY — COLONOSCOPY
Anesthesia: Moderate Sedation

## 2017-04-22 ENCOUNTER — Emergency Department (HOSPITAL_COMMUNITY): Payer: Non-veteran care

## 2017-04-22 ENCOUNTER — Encounter (HOSPITAL_COMMUNITY): Payer: Self-pay | Admitting: Emergency Medicine

## 2017-04-22 ENCOUNTER — Emergency Department (HOSPITAL_COMMUNITY)
Admission: EM | Admit: 2017-04-22 | Discharge: 2017-04-22 | Disposition: A | Discharge status: Home / Self Care | Payer: Non-veteran care | Attending: Emergency Medicine | Admitting: Emergency Medicine

## 2017-04-22 DIAGNOSIS — F17290 Nicotine dependence, other tobacco product, uncomplicated: Secondary | ICD-10-CM | POA: Insufficient documentation

## 2017-04-22 DIAGNOSIS — Z794 Long term (current) use of insulin: Secondary | ICD-10-CM | POA: Diagnosis not present

## 2017-04-22 DIAGNOSIS — W11XXXA Fall on and from ladder, initial encounter: Secondary | ICD-10-CM | POA: Insufficient documentation

## 2017-04-22 DIAGNOSIS — Z79899 Other long term (current) drug therapy: Secondary | ICD-10-CM | POA: Insufficient documentation

## 2017-04-22 DIAGNOSIS — S79911A Unspecified injury of right hip, initial encounter: Secondary | ICD-10-CM | POA: Diagnosis present

## 2017-04-22 DIAGNOSIS — E119 Type 2 diabetes mellitus without complications: Secondary | ICD-10-CM | POA: Diagnosis not present

## 2017-04-22 DIAGNOSIS — Y929 Unspecified place or not applicable: Secondary | ICD-10-CM | POA: Diagnosis not present

## 2017-04-22 DIAGNOSIS — Y999 Unspecified external cause status: Secondary | ICD-10-CM | POA: Insufficient documentation

## 2017-04-22 DIAGNOSIS — M25551 Pain in right hip: Secondary | ICD-10-CM | POA: Diagnosis not present

## 2017-04-22 DIAGNOSIS — S32591A Other specified fracture of right pubis, initial encounter for closed fracture: Secondary | ICD-10-CM | POA: Diagnosis not present

## 2017-04-22 DIAGNOSIS — Z7982 Long term (current) use of aspirin: Secondary | ICD-10-CM | POA: Diagnosis not present

## 2017-04-22 DIAGNOSIS — Y939 Activity, unspecified: Secondary | ICD-10-CM | POA: Insufficient documentation

## 2017-04-22 DIAGNOSIS — Z7902 Long term (current) use of antithrombotics/antiplatelets: Secondary | ICD-10-CM | POA: Diagnosis not present

## 2017-04-22 DIAGNOSIS — I10 Essential (primary) hypertension: Secondary | ICD-10-CM | POA: Diagnosis not present

## 2017-04-22 DIAGNOSIS — T148XXA Other injury of unspecified body region, initial encounter: Secondary | ICD-10-CM | POA: Diagnosis not present

## 2017-04-22 LAB — CBC WITH DIFFERENTIAL/PLATELET
Basophils Absolute: 0 10*3/uL (ref 0.0–0.1)
Basophils Relative: 0 %
EOS PCT: 2 %
Eosinophils Absolute: 0.2 10*3/uL (ref 0.0–0.7)
HCT: 31.7 % — ABNORMAL LOW (ref 39.0–52.0)
Hemoglobin: 10.3 g/dL — ABNORMAL LOW (ref 13.0–17.0)
LYMPHS ABS: 1.7 10*3/uL (ref 0.7–4.0)
LYMPHS PCT: 16 %
MCH: 28.4 pg (ref 26.0–34.0)
MCHC: 32.5 g/dL (ref 30.0–36.0)
MCV: 87.3 fL (ref 78.0–100.0)
MONO ABS: 0.7 10*3/uL (ref 0.1–1.0)
Monocytes Relative: 6 %
Neutro Abs: 8.3 10*3/uL — ABNORMAL HIGH (ref 1.7–7.7)
Neutrophils Relative %: 76 %
PLATELETS: 263 10*3/uL (ref 150–400)
RBC: 3.63 MIL/uL — ABNORMAL LOW (ref 4.22–5.81)
RDW: 12.8 % (ref 11.5–15.5)
WBC: 11 10*3/uL — ABNORMAL HIGH (ref 4.0–10.5)

## 2017-04-22 LAB — COMPREHENSIVE METABOLIC PANEL
ALT: 20 U/L (ref 17–63)
AST: 18 U/L (ref 15–41)
Albumin: 3.8 g/dL (ref 3.5–5.0)
Alkaline Phosphatase: 123 U/L (ref 38–126)
Anion gap: 4 — ABNORMAL LOW (ref 5–15)
BUN: 21 mg/dL — ABNORMAL HIGH (ref 6–20)
CHLORIDE: 106 mmol/L (ref 101–111)
CO2: 27 mmol/L (ref 22–32)
Calcium: 9.1 mg/dL (ref 8.9–10.3)
Creatinine, Ser: 0.84 mg/dL (ref 0.61–1.24)
Glucose, Bld: 157 mg/dL — ABNORMAL HIGH (ref 65–99)
POTASSIUM: 5 mmol/L (ref 3.5–5.1)
Sodium: 137 mmol/L (ref 135–145)
Total Bilirubin: 0.4 mg/dL (ref 0.3–1.2)
Total Protein: 7 g/dL (ref 6.5–8.1)

## 2017-04-22 LAB — CBG MONITORING, ED
GLUCOSE-CAPILLARY: 143 mg/dL — AB (ref 65–99)
GLUCOSE-CAPILLARY: 266 mg/dL — AB (ref 65–99)

## 2017-04-22 MED ORDER — ONDANSETRON HCL 4 MG/2ML IJ SOLN
4.0000 mg | Freq: Once | INTRAMUSCULAR | Status: AC
  Administered 2017-04-22: 4 mg via INTRAVENOUS
  Filled 2017-04-22: qty 2

## 2017-04-22 MED ORDER — HYDROMORPHONE HCL 1 MG/ML IJ SOLN
1.0000 mg | Freq: Once | INTRAMUSCULAR | Status: AC
  Administered 2017-04-22: 1 mg via INTRAVENOUS
  Filled 2017-04-22: qty 1

## 2017-04-22 MED ORDER — OXYCODONE-ACETAMINOPHEN 5-325 MG PO TABS: 1.0000 | ORAL_TABLET | Freq: Four times a day (QID) | ORAL | 0 refills | Status: DC | PRN

## 2017-04-22 MED ORDER — ONDANSETRON HCL 4 MG PO TABS: 4.0000 mg | ORAL_TABLET | Freq: Three times a day (TID) | ORAL | 0 refills | Status: DC | PRN

## 2017-04-22 MED ORDER — FENTANYL CITRATE (PF) 100 MCG/2ML IJ SOLN
100.0000 ug | Freq: Once | INTRAMUSCULAR | Status: AC
  Administered 2017-04-22: 100 ug via INTRAVENOUS
  Filled 2017-04-22: qty 2

## 2017-04-22 NOTE — ED Notes (Signed)
Pt states he can not urinate at this time. Pt aware we need a urine sample.

## 2017-04-22 NOTE — ED Provider Notes (Signed)
[facility] EMERGENCY DEPARTMENT Provider Note   CSN: [id number] Arrival date & time: 04/22/17  1144     History   Chief Complaint Chief Complaint  Patient presents with  . Fall    HPI Daniel Campos is a 61 y.o. male.  HPI  61 year old male who presents after fall down 12-15 ft. History of DM, HTN. No anticoagulation usage. Ladder slipped out from under him while he was cleaning the roof this morning. Reports landing on his right side. No head strike or LOC. Unable to get up after fall due to right hip and upper leg pain. Denies HA, N/V, numbness/weakness focally, abdominal pain, neck pain or back pain, chest pain, or difficulty breathing. EMS called by family.   Past Medical History:  Diagnosis Date  . Diabetes mellitus without complication ([place])   . Hypertension   . Prostate hypertrophy     Patient Active Problem List   Diagnosis Date Noted  . Loose stools 08/31/2016  . Encounter for screening colonoscopy 04/29/2016  . Rotator cuff tear 02/24/2013  . Stiffness of joint, not elsewhere classified, other specified site 01/16/2013  . Weakness of right leg 01/16/2013  . Fracture of right shoulder 01/04/2013  . Osteopenia 01/02/2013  . Intertrochanteric fracture of right hip ([place]) 12/20/2012  . Shoulder fracture 12/20/2012  . Essential hypertension, benign 10/16/2012  . Diabetes ([place]) 08/16/2012  . Prostate hypertrophy 08/16/2012    Past Surgical History:  Procedure Laterality Date  . INTRAMEDULLARY (IM) NAIL INTERTROCHANTERIC Right 12/06/2012   Procedure: INTRAMEDULLARY (IM) NAIL INTERTROCHANTRIC;  Surgeon: Carole Civil, MD;  Location: [facility];  Service: Orthopedics;  Laterality: Right;  after lunch   . KNEE SURGERY    . SHOULDER SURGERY         Home Medications    Prior to Admission medications   Medication Sig Start Date End Date Taking? Authorizing Provider  aspirin 81 MG tablet Take 81 mg by mouth daily.   Yes [provider]  B Complex-C  (B-COMPLEX WITH VITAMIN C) tablet Take 1 tablet by mouth daily.   Yes [provider]  capsaicin (ZOSTRIX) 0.025 % cream Apply 1 application topically 3 (three) times daily.   Yes [provider]  Cholecalciferol (VITAMIN D PO) Take 1 tablet by mouth daily.   Yes [provider]  gabapentin (NEURONTIN) 300 MG capsule Take 1 capsule by mouth 3 (three) times daily.   Yes [provider]  insulin detemir (LEVEMIR) 100 UNIT/ML injection Inject 26 Units into the skin at bedtime.    Yes [provider]  Melatonin 10 MG TABS Take 10 mg by mouth at bedtime.   Yes [provider]  meloxicam (MOBIC) 15 MG tablet Take 15 mg by mouth daily.   Yes [provider]  NOVOLOG FLEXPEN 100 UNIT/ML FlexPen Inject 5 Units into the skin 3 (three) times daily with meals.  08/26/13  Yes [provider]  omeprazole (PRILOSEC) 20 MG capsule Take 40 mg by mouth every morning.   Yes [provider]  pravastatin (PRAVACHOL) 10 MG tablet Take 5 mg by mouth daily.   Yes [provider]  ranitidine (ZANTAC) 150 MG tablet Take 300 mg by mouth at bedtime.   Yes [provider]  ondansetron (ZOFRAN) 4 MG tablet Take 1 tablet (4 mg total) by mouth every 8 (eight) hours as needed for nausea or vomiting. 04/22/17   Forde Dandy, MD  oxyCODONE-acetaminophen (PERCOCET/ROXICET) 5-325 MG tablet Take 1-2  tablets by mouth every 6 (six) hours as needed for severe pain. 04/22/17   Forde Dandy, MD    Family History Family History  Problem Relation Age of Onset  . Heart attack Mother   . Colon polyps Neg Hx   . Colon cancer Neg Hx     Social History Social History   Tobacco Use  . Smoking status: Former Smoker    Packs/day: 1.00    Types: E-cigarettes  . Smokeless tobacco: Never Used  Substance Use Topics  . Alcohol use: No  . Drug use: No     Allergies   Patient has no known allergies.   Review of Systems Review of Systems    Gastrointestinal: Negative for abdominal pain.  Musculoskeletal: Negative for back pain and neck pain.  Skin: Positive for wound.  Neurological: Negative for syncope, speech difficulty, weakness, numbness and headaches.  Hematological: Does not bruise/bleed easily.  All other systems reviewed and are negative.    Physical Exam Updated Vital Signs BP (!) 143/78   Pulse 96   Temp 97.7 F (36.5 C) (Axillary)   Resp 16   Ht 5\' 11"  (1.803 m)   Wt 76.2 kg (168 lb)   SpO2 97%   BMI 23.43 kg/m   Physical Exam Physical Exam  Nursing note and vitals reviewed. Constitutional: Well developed, well nourished, non-toxic, and in no acute distress Head: Normocephalic and atraumatic.  Mouth/Throat: Oropharynx is clear and moist.  Neck: Normal range of motion. Neck supple. cervical collar in place. No cervical spine tenderness Cardiovascular: Normal rate and regular rhythm.  +2 distal pulses Pulmonary/Chest: Effort normal and breath sounds normal. no chest wall tenderness Abdominal: Soft. There is no tenderness. There is no rebound and no guarding. No TLS spine tenderness to palpation Musculoskeletal: tenderness over proximal femur. No obvious bruising or deformity Neurological: Alert, no facial droop, fluent speech, moves all extremities symmetrically Skin: Skin is warm and dry.  Psychiatric: Cooperative   ED Treatments / Results  Labs (all labs ordered are listed, but only abnormal results are displayed) Labs Reviewed  CBC WITH DIFFERENTIAL/PLATELET - Abnormal; Notable for the following components:      Result Value   WBC 11.0 (*)    RBC 3.63 (*)    Hemoglobin 10.3 (*)    HCT 31.7 (*)    Neutro Abs 8.3 (*)    All other components within normal limits  COMPREHENSIVE METABOLIC PANEL - Abnormal; Notable for the following components:   Glucose, Bld 157 (*)    BUN 21 (*)    Anion gap 4 (*)    All other components within normal limits  CBG MONITORING, ED - Abnormal; Notable for the  following components:   Glucose-Capillary 143 (*)    All other components within normal limits  CBG MONITORING, ED - Abnormal; Notable for the following components:   Glucose-Capillary 266 (*)    All other components within normal limits  URINALYSIS, ROUTINE W REFLEX MICROSCOPIC  I-STAT CHEM 8, ED    EKG  EKG Interpretation  Date/Time:  Friday April 22 2017 11:49:27 EST Ventricular Rate:  78 PR Interval:    QRS Duration: 101 QT Interval:  414 QTC Calculation: 472 R Axis:   60 Text Interpretation:  Sinus rhythm Probable left ventricular hypertrophy Borderline ST elevation, lateral leads Baseline wander in lead(s) V6 wandering baseline similar to previous EKG 05/02/2015 and 10/14/2010 Confirmed by Brantley Stage [phone]) on 04/22/2017 11:59:04 AM       Radiology Ct  Cervical Spine Wo Contrast  Result Date: 04/22/2017 CLINICAL DATA:  Fall. EXAM: CT CERVICAL SPINE WITHOUT CONTRAST TECHNIQUE: Multidetector CT imaging of the cervical spine was performed without intravenous contrast. Multiplanar CT image reconstructions were also generated. COMPARISON:  CT scan of May 02, 2015. FINDINGS: Alignment: Normal. Skull base and vertebrae: No acute fracture. No primary bone lesion or focal pathologic process. Soft tissues and spinal canal: No prevertebral fluid or swelling. No visible canal hematoma. Disc levels: Anterior osteophyte formation is noted at C3-4 and C4-5. Severe degenerative disc disease is noted at C5-6 and C6-7 with anterior osteophyte formation. Upper chest: Negative. Other: None. IMPRESSION: Multilevel degenerative disc disease. No acute abnormality seen in the cervical spine. Electronically Signed   By: Marijo Conception, M.D.   On: 04/22/2017 13:46   Dg Pelvis Portable  Result Date: 04/22/2017 CLINICAL DATA:  Anterior right leg pain after falling 12 feet from a ladder. EXAM: PORTABLE PELVIS 1-2 VIEWS COMPARISON:  CT chest, abdomen, and pelvis dated May 02, 2015. FINDINGS:  Irregularity of the right superior and inferior pubic rami, suspicious for nondisplaced fractures. Prior right femoral cephalomedullary rod fixation. Mild degenerative changes of both hip joints. Osteopenia. IMPRESSION: Nondisplaced fractures of the right superior and inferior pubic rami. Electronically Signed   By: Titus Dubin M.D.   On: 04/22/2017 12:56   Dg Chest Portable 1 View  Result Date: 04/22/2017 CLINICAL DATA:  Fall.  Diabetes.  Hypertension. EXAM: PORTABLE CHEST 1 VIEW COMPARISON:  CT 1 pleural scratched it CT 05/02/2015. Chest x-ray 05/02/2015. FINDINGS: Cardiomegaly mild pulmonary venous congestion. No focal infiltrate. No pleural effusion or pneumothorax. No acute bony abnormality. Stable deformity noted the right humerus. IMPRESSION: Cardiomegaly with mild pulmonary venous congestion. No focal infiltrate or pulmonary edema. Electronically Signed   By: Marcello Moores  Register   On: 04/22/2017 12:49   Dg Femur Portable Min 2 Views Right  Result Date: 04/22/2017 CLINICAL DATA:  Initial evaluation for a acute trauma, fall. EXAM: RIGHT FEMUR PORTABLE 2 VIEW COMPARISON:  None. FINDINGS: The IM fixation nail with interlocking screws in place within knee proximal femur. Postsurgical changes present about the right knee. No evidence for hardware complication. No acute fracture or dislocation. No acute soft tissue abnormality. Moderate to advanced osteoarthritic changes present about the hip and knee. IMPRESSION: 1. No acute fracture or dislocation. 2. Sequelae of prior ORIF about the right hip and knee without hardware complication. Electronically Signed   By: Jeannine Boga M.D.   On: 04/22/2017 12:55    Procedures Procedures (including critical care time)  Medications Ordered in ED Medications  fentaNYL (SUBLIMAZE) injection 100 mcg (100 mcg Intravenous Given 04/22/17 1223)  HYDROmorphone (DILAUDID) injection 1 mg (1 mg Intravenous Given 04/22/17 1403)  ondansetron (ZOFRAN) injection 4 mg  (4 mg Intravenous Given 04/22/17 1623)     Initial Impression / Assessment and Plan / ED Course  I have reviewed the triage vital signs and the nursing notes.  Pertinent labs & imaging results that were available during my care of the patient were reviewed by me and considered in my medical decision making (see chart for details).     61 year old male who presents after fall off of ladder from 12 feet.  He denies any head injury, and is mentating normally and neurologically intact.  Vitals are reassuring.  Primarily complains of pain in the right hip.  He has a small bruise circular in nature over the anterior abdomen, which she reports is not from his fall but  from his insulin shots.  He has a soft benign abdomen.  Given mechanism injury, cervical collar cannot be clinically cleared, so he subsequently underwent CT cervical spine showing no traumatic injuries.  X-rays of the hip and pelvis does reveal reveal right superior inferior pubic rami fractures that are nondisplaced. CXR visualized and unremarkable. No cardiopulmonary complaints.  His pain has been well controlled in the ED and he has been able to ambulate with walker and assistance.  He feels comfortable with discharge home and outpatient follow-up with Dr. Aline Brochure, who he has seen in the past. He has walkers at home. Crutches also provided.   Strict return and follow-up instructions reviewed. He expressed understanding of all discharge instructions and felt comfortable with the plan of care.   Final Clinical Impressions(s) / ED Diagnoses   Final diagnoses:  Fracture of multiple pubic rami, right, closed, initial encounter [facility])    ED Discharge Orders        Ordered    ondansetron (ZOFRAN) 4 MG tablet  Every 8 hours PRN     04/22/17 1632    oxyCODONE-acetaminophen (PERCOCET/ROXICET) 5-325 MG tablet  Every 6 hours PRN     04/22/17 1632       Forde Dandy, MD 04/22/17 1635

## 2017-04-22 NOTE — ED Notes (Signed)
Pt able to waLK TO DESK AND BACK INTO ROOM

## 2017-04-22 NOTE — Discharge Instructions (Addendum)
You have a pelvic fracture. Please call Dr. Aline Brochure for follow-up. Please ambulate as you tolerate  Return without fail for worsening symptoms, including inability to walk, escalating pain, intractable vomiting or any other symptoms concerning to you.

## 2017-04-22 NOTE — ED Triage Notes (Addendum)
Pt was on a ladder blowing leaves and fell.  C/o right hip pain. Bruising noted to right lower abdomen.  Pt denies pain here.  No neuro deficit.   C collar in place by ems.  Fentanyl 158mcg given by ems.  Pt is alert and oriented.

## 2017-04-26 ENCOUNTER — Encounter (HOSPITAL_COMMUNITY): Payer: Self-pay | Admitting: Cardiology

## 2017-04-26 ENCOUNTER — Inpatient Hospital Stay (HOSPITAL_COMMUNITY)
Admission: EM | Admit: 2017-04-26 | Discharge: 2017-04-29 | DRG: 682 | Disposition: A | Discharge status: Home / Self Care | Payer: Medicare Other | Attending: Internal Medicine | Admitting: Internal Medicine

## 2017-04-26 ENCOUNTER — Telehealth: Payer: Self-pay | Admitting: Orthopedic Surgery

## 2017-04-26 ENCOUNTER — Emergency Department (HOSPITAL_COMMUNITY): Payer: Medicare Other

## 2017-04-26 DIAGNOSIS — S32592A Other specified fracture of left pubis, initial encounter for closed fracture: Secondary | ICD-10-CM | POA: Diagnosis not present

## 2017-04-26 DIAGNOSIS — N179 Acute kidney failure, unspecified: Secondary | ICD-10-CM | POA: Diagnosis not present

## 2017-04-26 DIAGNOSIS — E785 Hyperlipidemia, unspecified: Secondary | ICD-10-CM | POA: Diagnosis present

## 2017-04-26 DIAGNOSIS — S32591A Other specified fracture of right pubis, initial encounter for closed fracture: Secondary | ICD-10-CM | POA: Diagnosis present

## 2017-04-26 DIAGNOSIS — T40605A Adverse effect of unspecified narcotics, initial encounter: Secondary | ICD-10-CM | POA: Diagnosis present

## 2017-04-26 DIAGNOSIS — R000 Tachycardia, unspecified: Secondary | ICD-10-CM | POA: Diagnosis present

## 2017-04-26 DIAGNOSIS — E871 Hypo-osmolality and hyponatremia: Secondary | ICD-10-CM | POA: Diagnosis present

## 2017-04-26 DIAGNOSIS — W132XXA Fall from, out of or through roof, initial encounter: Secondary | ICD-10-CM | POA: Diagnosis present

## 2017-04-26 DIAGNOSIS — R338 Other retention of urine: Secondary | ICD-10-CM

## 2017-04-26 DIAGNOSIS — I10 Essential (primary) hypertension: Secondary | ICD-10-CM | POA: Diagnosis present

## 2017-04-26 DIAGNOSIS — J9601 Acute respiratory failure with hypoxia: Secondary | ICD-10-CM | POA: Diagnosis present

## 2017-04-26 DIAGNOSIS — Z86711 Personal history of pulmonary embolism: Secondary | ICD-10-CM

## 2017-04-26 DIAGNOSIS — Z87891 Personal history of nicotine dependence: Secondary | ICD-10-CM

## 2017-04-26 DIAGNOSIS — D649 Anemia, unspecified: Secondary | ICD-10-CM | POA: Diagnosis present

## 2017-04-26 DIAGNOSIS — E860 Dehydration: Secondary | ICD-10-CM | POA: Diagnosis present

## 2017-04-26 DIAGNOSIS — I2699 Other pulmonary embolism without acute cor pulmonale: Secondary | ICD-10-CM | POA: Diagnosis not present

## 2017-04-26 DIAGNOSIS — Y92018 Other place in single-family (private) house as the place of occurrence of the external cause: Secondary | ICD-10-CM | POA: Diagnosis not present

## 2017-04-26 DIAGNOSIS — N401 Enlarged prostate with lower urinary tract symptoms: Secondary | ICD-10-CM | POA: Diagnosis present

## 2017-04-26 DIAGNOSIS — Z8249 Family history of ischemic heart disease and other diseases of the circulatory system: Secondary | ICD-10-CM

## 2017-04-26 DIAGNOSIS — R7881 Bacteremia: Secondary | ICD-10-CM

## 2017-04-26 DIAGNOSIS — E1151 Type 2 diabetes mellitus with diabetic peripheral angiopathy without gangrene: Secondary | ICD-10-CM

## 2017-04-26 DIAGNOSIS — K5903 Drug induced constipation: Secondary | ICD-10-CM | POA: Diagnosis present

## 2017-04-26 DIAGNOSIS — Z794 Long term (current) use of insulin: Secondary | ICD-10-CM

## 2017-04-26 DIAGNOSIS — I248 Other forms of acute ischemic heart disease: Secondary | ICD-10-CM | POA: Diagnosis present

## 2017-04-26 DIAGNOSIS — E119 Type 2 diabetes mellitus without complications: Secondary | ICD-10-CM | POA: Diagnosis present

## 2017-04-26 DIAGNOSIS — E11649 Type 2 diabetes mellitus with hypoglycemia without coma: Secondary | ICD-10-CM | POA: Diagnosis present

## 2017-04-26 DIAGNOSIS — Z7982 Long term (current) use of aspirin: Secondary | ICD-10-CM

## 2017-04-26 LAB — BLOOD GAS, VENOUS
ACID-BASE DEFICIT: 6 mmol/L — AB (ref 0.0–2.0)
Bicarbonate: 19.1 mmol/L — ABNORMAL LOW (ref 20.0–28.0)
O2 SAT: 70.7 %
PCO2 VEN: 39.9 mmHg — AB (ref 44.0–60.0)
PO2 VEN: 44.4 mmHg (ref 32.0–45.0)
Patient temperature: 37
pH, Ven: 7.304 (ref 7.250–7.430)

## 2017-04-26 LAB — URINALYSIS, ROUTINE W REFLEX MICROSCOPIC
Bacteria, UA: NONE SEEN
Bilirubin Urine: NEGATIVE
GLUCOSE, UA: 150 mg/dL — AB
Ketones, ur: NEGATIVE mg/dL
Leukocytes, UA: NEGATIVE
NITRITE: NEGATIVE
PH: 5 (ref 5.0–8.0)
PROTEIN: NEGATIVE mg/dL
Specific Gravity, Urine: 1.015 (ref 1.005–1.030)
Squamous Epithelial / LPF: NONE SEEN

## 2017-04-26 LAB — TROPONIN I: TROPONIN I: 0.04 ng/mL — AB (ref <0.03)

## 2017-04-26 LAB — CBC WITH DIFFERENTIAL/PLATELET
BASOS ABS: 0 10*3/uL (ref 0.0–0.1)
BASOS PCT: 0 %
EOS ABS: 0 10*3/uL (ref 0.0–0.7)
EOS PCT: 0 %
HCT: 26.8 % — ABNORMAL LOW (ref 39.0–52.0)
Hemoglobin: 8.7 g/dL — ABNORMAL LOW (ref 13.0–17.0)
Lymphocytes Relative: 5 %
Lymphs Abs: 0.8 10*3/uL (ref 0.7–4.0)
MCH: 28.8 pg (ref 26.0–34.0)
MCHC: 32.5 g/dL (ref 30.0–36.0)
MCV: 88.7 fL (ref 78.0–100.0)
MONO ABS: 1.2 10*3/uL — AB (ref 0.1–1.0)
Monocytes Relative: 7 %
Neutro Abs: 14 10*3/uL — ABNORMAL HIGH (ref 1.7–7.7)
Neutrophils Relative %: 88 %
PLATELETS: 234 10*3/uL (ref 150–400)
RBC: 3.02 MIL/uL — AB (ref 4.22–5.81)
RDW: 13.3 % (ref 11.5–15.5)
WBC: 16 10*3/uL — AB (ref 4.0–10.5)

## 2017-04-26 LAB — GLUCOSE, CAPILLARY: GLUCOSE-CAPILLARY: 267 mg/dL — AB (ref 65–99)

## 2017-04-26 LAB — COMPREHENSIVE METABOLIC PANEL
ALT: 15 U/L — AB (ref 17–63)
AST: 16 U/L (ref 15–41)
Albumin: 3.2 g/dL — ABNORMAL LOW (ref 3.5–5.0)
Alkaline Phosphatase: 90 U/L (ref 38–126)
Anion gap: 14 (ref 5–15)
BUN: 93 mg/dL — AB (ref 6–20)
CHLORIDE: 98 mmol/L — AB (ref 101–111)
CO2: 19 mmol/L — AB (ref 22–32)
CREATININE: 2.69 mg/dL — AB (ref 0.61–1.24)
Calcium: 7.9 mg/dL — ABNORMAL LOW (ref 8.9–10.3)
GFR calc Af Amer: 28 mL/min — ABNORMAL LOW (ref >60)
GFR, EST NON AFRICAN AMERICAN: 24 mL/min — AB (ref >60)
Glucose, Bld: 166 mg/dL — ABNORMAL HIGH (ref 65–99)
POTASSIUM: 5.1 mmol/L (ref 3.5–5.1)
SODIUM: 131 mmol/L — AB (ref 135–145)
Total Bilirubin: 1 mg/dL (ref 0.3–1.2)
Total Protein: 7.1 g/dL (ref 6.5–8.1)

## 2017-04-26 LAB — TYPE AND SCREEN
ABO/RH(D): A POS
ANTIBODY SCREEN: NEGATIVE

## 2017-04-26 LAB — TSH: TSH: 1.44 u[IU]/mL (ref 0.350–4.500)

## 2017-04-26 LAB — OSMOLALITY: OSMOLALITY: 322 mosm/kg — AB (ref 275–295)

## 2017-04-26 LAB — SODIUM, URINE, RANDOM: SODIUM UR: 31 mmol/L

## 2017-04-26 LAB — POC OCCULT BLOOD, ED: Fecal Occult Bld: NEGATIVE

## 2017-04-26 LAB — LIPASE, BLOOD: Lipase: 16 U/L (ref 11–51)

## 2017-04-26 LAB — SEDIMENTATION RATE: SED RATE: 100 mm/h — AB (ref 0–16)

## 2017-04-26 LAB — D-DIMER, QUANTITATIVE (NOT AT ARMC)

## 2017-04-26 MED ORDER — DEXTROSE 5 % IV SOLN
500.0000 mg | Freq: Once | INTRAVENOUS | Status: AC
  Administered 2017-04-26: 500 mg via INTRAVENOUS
  Filled 2017-04-26: qty 500

## 2017-04-26 MED ORDER — INSULIN DETEMIR 100 UNIT/ML ~~LOC~~ SOLN
26.0000 [IU] | Freq: Every day | SUBCUTANEOUS | Status: DC
  Administered 2017-04-26 – 2017-04-28 (×3): 26 [IU] via SUBCUTANEOUS
  Filled 2017-04-26 (×6): qty 0.26

## 2017-04-26 MED ORDER — MELATONIN 10 MG PO TABS: 10.0000 mg | ORAL_TABLET | Freq: Every day | ORAL | Status: DC

## 2017-04-26 MED ORDER — INSULIN ASPART 100 UNIT/ML ~~LOC~~ SOLN
0.0000 [IU] | Freq: Every day | SUBCUTANEOUS | Status: DC
  Administered 2017-04-26: 3 [IU] via SUBCUTANEOUS

## 2017-04-26 MED ORDER — HEPARIN (PORCINE) IN NACL 100-0.45 UNIT/ML-% IJ SOLN
1400.0000 [IU]/h | INTRAMUSCULAR | Status: DC
  Administered 2017-04-26 – 2017-04-27 (×2): 1300 [IU]/h via INTRAVENOUS
  Filled 2017-04-26 (×3): qty 250

## 2017-04-26 MED ORDER — INSULIN ASPART 100 UNIT/ML ~~LOC~~ SOLN
0.0000 [IU] | Freq: Three times a day (TID) | SUBCUTANEOUS | Status: DC
  Administered 2017-04-27 (×2): 3 [IU] via SUBCUTANEOUS
  Administered 2017-04-27: 2 [IU] via SUBCUTANEOUS
  Administered 2017-04-28: 5 [IU] via SUBCUTANEOUS

## 2017-04-26 MED ORDER — ASPIRIN EC 81 MG PO TBEC
81.0000 mg | DELAYED_RELEASE_TABLET | Freq: Every day | ORAL | Status: DC
  Administered 2017-04-27 – 2017-04-29 (×3): 81 mg via ORAL
  Filled 2017-04-26 (×3): qty 1

## 2017-04-26 MED ORDER — OXYCODONE-ACETAMINOPHEN 5-325 MG PO TABS
1.0000 | ORAL_TABLET | Freq: Four times a day (QID) | ORAL | Status: DC | PRN
  Administered 2017-04-27: 1 via ORAL
  Administered 2017-04-27: 2 via ORAL
  Administered 2017-04-27: 1 via ORAL
  Administered 2017-04-28 (×2): 2 via ORAL
  Administered 2017-04-28: 1 via ORAL
  Administered 2017-04-29 (×2): 2 via ORAL
  Filled 2017-04-26: qty 1
  Filled 2017-04-26 (×8): qty 2

## 2017-04-26 MED ORDER — PANTOPRAZOLE SODIUM 40 MG PO TBEC
40.0000 mg | DELAYED_RELEASE_TABLET | Freq: Every day | ORAL | Status: DC
  Administered 2017-04-27 – 2017-04-29 (×3): 40 mg via ORAL
  Filled 2017-04-26 (×3): qty 1

## 2017-04-26 MED ORDER — SODIUM CHLORIDE 0.9 % IV BOLUS (SEPSIS)
1000.0000 mL | Freq: Once | INTRAVENOUS | Status: AC
  Administered 2017-04-26: 1000 mL via INTRAVENOUS

## 2017-04-26 MED ORDER — ASPIRIN 81 MG PO TABS: 81.0000 mg | ORAL_TABLET | Freq: Every day | ORAL | Status: DC

## 2017-04-26 MED ORDER — DEXTROSE 5 % IV SOLN
500.0000 mg | INTRAVENOUS | Status: DC
  Administered 2017-04-27: 500 mg via INTRAVENOUS
  Filled 2017-04-26 (×2): qty 500

## 2017-04-26 MED ORDER — ONDANSETRON HCL 4 MG PO TABS: 4.0000 mg | ORAL_TABLET | Freq: Three times a day (TID) | ORAL | Status: DC | PRN

## 2017-04-26 MED ORDER — GABAPENTIN 300 MG PO CAPS
300.0000 mg | ORAL_CAPSULE | Freq: Three times a day (TID) | ORAL | Status: DC
  Administered 2017-04-26 – 2017-04-29 (×8): 300 mg via ORAL
  Filled 2017-04-26 (×8): qty 1

## 2017-04-26 MED ORDER — HEPARIN BOLUS VIA INFUSION
5000.0000 [IU] | Freq: Once | INTRAVENOUS | Status: AC
  Administered 2017-04-26: 5000 [IU] via INTRAVENOUS
  Filled 2017-04-26: qty 5000

## 2017-04-26 MED ORDER — DEXTROSE 5 % IV SOLN
1.0000 g | INTRAVENOUS | Status: DC
  Administered 2017-04-27: 1 g via INTRAVENOUS
  Filled 2017-04-26 (×2): qty 10

## 2017-04-26 MED ORDER — MORPHINE SULFATE (PF) 4 MG/ML IV SOLN
6.0000 mg | Freq: Once | INTRAVENOUS | Status: AC
  Administered 2017-04-26: 6 mg via INTRAVENOUS
  Filled 2017-04-26: qty 2

## 2017-04-26 MED ORDER — SODIUM CHLORIDE 0.9 % IV SOLN
INTRAVENOUS | Status: AC
  Administered 2017-04-26 – 2017-04-27 (×3): via INTRAVENOUS

## 2017-04-26 MED ORDER — ONDANSETRON HCL 4 MG/2ML IJ SOLN
INTRAMUSCULAR | Status: AC
  Filled 2017-04-26: qty 2

## 2017-04-26 MED ORDER — DEXTROSE 5 % IV SOLN
1.0000 g | Freq: Once | INTRAVENOUS | Status: AC
  Administered 2017-04-26: 1 g via INTRAVENOUS
  Filled 2017-04-26: qty 10

## 2017-04-26 NOTE — ED Notes (Signed)
CRITICAL VALUE ALERT  Critical Value:  Troponin 0.04   Date & Time Notied:  04/26/17  Provider Notified: Notified MD   Orders Received/Actions taken: Notified MD

## 2017-04-26 NOTE — ED Notes (Signed)
Lower abdominal distension with bruising to LLQ AND LRQ.

## 2017-04-26 NOTE — Progress Notes (Signed)
ddimer >20, pt tachycardic and has been bedbound due to his pubic rami fracture,  Heparin iv per pharmacy due to ARF,  VQ scan in am.

## 2017-04-26 NOTE — ED Provider Notes (Signed)
Emergency Department Provider Note   I have reviewed the triage vital signs and the nursing notes.   HISTORY  Chief Complaint Fall and Abdominal Pain   HPI Daniel Campos is a 61 y.o. male 2 diabetes and hypertension who presents to the emergency department today secondary to abdominal pain.  Patient states that he fell off of a roof approximately 12-15 feet (there is a nursing note that says 30 but he specifically denies that it was this high) on Friday came here and was evaluated for some neck pain and pelvis pain was found also pelvis fractures and started on pain medication and was discharged.  He started having abdominal pain later that night persistently worsened and he started having decreased bowel movements and decreased urination yesterday with worsening abdominal pain and distention so came here for further evaluation.  Not complain of any pain elsewhere.  No other new injuries.  He says he has decreased appetite he feels full but has been drinking a lot of water.   Past Medical History:  Diagnosis Date  . Diabetes mellitus without complication ([place])   . Hypertension   . Prostate hypertrophy     Patient Active Problem List   Diagnosis Date Noted  . ARF (acute renal failure) ([place]) 04/26/2017  . Tachycardia 04/26/2017  . Anemia 04/26/2017  . Hyponatremia 04/26/2017  . Loose stools 08/31/2016  . Encounter for screening colonoscopy 04/29/2016  . Rotator cuff tear 02/24/2013  . Stiffness of joint, not elsewhere classified, other specified site 01/16/2013  . Weakness of right leg 01/16/2013  . Fracture of right shoulder 01/04/2013  . Osteopenia 01/02/2013  . Intertrochanteric fracture of right hip ([place]) 12/20/2012  . Shoulder fracture 12/20/2012  . Essential hypertension, benign 10/16/2012  . Diabetes ([place]) 08/16/2012  . Prostate hypertrophy 08/16/2012    Past Surgical History:  Procedure Laterality Date  . INTRAMEDULLARY (IM) NAIL INTERTROCHANTERIC Right  12/06/2012   Procedure: INTRAMEDULLARY (IM) NAIL INTERTROCHANTRIC;  Surgeon: Carole Civil, MD;  Location: [facility];  Service: Orthopedics;  Laterality: Right;  after lunch   . KNEE SURGERY    . SHOULDER SURGERY      Current Outpatient Rx  . Order #: [id number] Class: Historical Med  . Order #: [id number] Class: Historical Med  . Order #: [id number] Class: Historical Med  . Order #: [id number] Class: Historical Med  . Order #: [id number] Class: Historical Med  . Order #: [id number] Class: Historical Med  . Order #: [id number] Class: Historical Med  . Order #: [id number] Class: Historical Med  . Order #: [id number] Class: Historical Med  . Order #: [id number] Class: Historical Med  . Order #: [id number] Class: Print  . Order #: [id number] Class: Print  . Order #: [id number] Class: Historical Med  . Order #: [id number] Class: Historical Med    Allergies Patient has no known allergies.  Family History  Problem Relation Age of Onset  . Heart attack Mother   . Colon polyps Neg Hx   . Colon cancer Neg Hx     Social History Social History   Tobacco Use  . Smoking status: Former Smoker    Packs/day: 1.00    Types: E-cigarettes  . Smokeless tobacco: Never Used  Substance Use Topics  . Alcohol use: No  . Drug use: No    Review of Systems  All other systems negative except as documented in the HPI. All pertinent positives and negatives as reviewed in the HPI. ____________________________________________   PHYSICAL EXAM:  VITAL SIGNS: ED Triage Vitals [04/26/17 1233]  Enc Vitals Group  BP (!) 156/70     Pulse Rate (!) 106     Resp 18     Temp (!) 97.5 F (36.4 C)     Temp Source Oral     SpO2 97 %    Constitutional: Alert and oriented. Well appearing and in no acute distress. Eyes: Conjunctivae are normal. PERRL. EOMI. Eyes sunken.  Head: Atraumatic. Nose: No congestion/rhinnorhea. Mouth/Throat: Mucous membranes are dry. Lips chapped and pale.  Oropharynx non-erythematous. Neck: No  stridor.  No meningeal signs.   Cardiovascular: tachycardic rate, regular rhythm. Good peripheral circulation. Grossly normal heart sounds.   Respiratory: Normal respiratory effort.  No retractions. Lungs CTAB. Gastrointestinal: Soft and diffusely tender anteriorly. Swelling, ecchymosis and worse tenderness under umbilicus. Mild distension.  Musculoskeletal: No lower extremity tenderness nor edema. No gross deformities of extremities. Neurologic:  Normal speech and language. No gross focal neurologic deficits are appreciated.  Skin:  Skin is warm, dry and intact. No rash noted.   ____________________________________________   LABS (all labs ordered are listed, but only abnormal results are displayed)  Labs Reviewed  CBC WITH DIFFERENTIAL/PLATELET - Abnormal; Notable for the following components:      Result Value   WBC 16.0 (*)    RBC 3.02 (*)    Hemoglobin 8.7 (*)    HCT 26.8 (*)    Neutro Abs 14.0 (*)    Monocytes Absolute 1.2 (*)    All other components within normal limits  COMPREHENSIVE METABOLIC PANEL - Abnormal; Notable for the following components:   Sodium 131 (*)    Chloride 98 (*)    CO2 19 (*)    Glucose, Bld 166 (*)    BUN 93 (*)    Creatinine, Ser 2.69 (*)    Calcium 7.9 (*)    Albumin 3.2 (*)    ALT 15 (*)    GFR calc non Af Amer 24 (*)    GFR calc Af Amer 28 (*)    All other components within normal limits  URINALYSIS, ROUTINE W REFLEX MICROSCOPIC - Abnormal; Notable for the following components:   Glucose, UA 150 (*)    Hgb urine dipstick SMALL (*)    All other components within normal limits  BLOOD GAS, VENOUS - Abnormal; Notable for the following components:   pCO2, Ven 39.9 (*)    Bicarbonate 19.1 (*)    Acid-base deficit 6.0 (*)    All other components within normal limits  CULTURE, BLOOD (ROUTINE X 2)  CULTURE, BLOOD (ROUTINE X 2)  LIPASE, BLOOD  TROPONIN I  OCCULT BLOOD X 1 CARD TO LAB, STOOL  SODIUM, URINE, RANDOM  CALCIUM / CREATININE  RATIO, URINE  TROPONIN I  TROPONIN I  TROPONIN I  LIPID PANEL  CBC  COMPREHENSIVE METABOLIC PANEL  TSH  FERRITIN  IRON AND TIBC  VITAMIN B12  FOLATE RBC  SEDIMENTATION RATE  OSMOLALITY  CORTISOL  OSMOLALITY, URINE  D-DIMER, QUANTITATIVE (NOT AT [facility])  POC OCCULT BLOOD, ED  TYPE AND SCREEN  CYTOLOGY - NON PAP   ____________________________________________  EKG   EKG Interpretation  Date/Time:  Tuesday April 26 2017 14:33:04 EST Ventricular Rate:  105 PR Interval:    QRS Duration: 104 QT Interval:  354 QTC Calculation: 468 R Axis:   92 Text Interpretation:  Sinus tachycardia Right axis deviation Abnormal T, consider ischemia, diffuse leads Baseline wander in lead(s) V3 inverted t waves in III, aVF new since december 7 Confirmed by Merrily Pew [phone]) on 04/26/2017 2:45:32 PM  ____________________________________________  RADIOLOGY  Ct Abdomen Pelvis Wo Contrast  Result Date: 04/26/2017 CLINICAL DATA:  Right hip pain and abdominal distention after fall off roof 4 days ago EXAM: CT CHEST, ABDOMEN AND PELVIS WITHOUT CONTRAST TECHNIQUE: Multidetector CT imaging of the chest, abdomen and pelvis was performed following the standard protocol without IV contrast. COMPARISON:  CT scan of May 02, 2015. FINDINGS: CT CHEST FINDINGS Cardiovascular: Atherosclerosis of thoracic aorta is noted without aneurysm formation. Coronary artery calcifications are noted. Normal cardiac size. No pericardial effusion. Mediastinum/Nodes: No enlarged mediastinal, hilar, or axillary lymph nodes. Thyroid gland, trachea, and esophagus demonstrate no significant findings. Lungs/Pleura: No pneumothorax or pleural effusion is noted. Mild bilateral posterior basilar subsegmental atelectasis is noted. Bronchial thickening and opacity is noted posteriorly in the superior segment of the right lower lobe concerning for focal inflammation. Musculoskeletal: Old proximal right humeral neck fracture  is noted. CT ABDOMEN PELVIS FINDINGS Hepatobiliary: No focal liver abnormality is seen. No gallstones, gallbladder wall thickening, or biliary dilatation. Pancreas: Unremarkable. No pancreatic ductal dilatation or surrounding inflammatory changes. Spleen: Normal in size without focal abnormality. Adrenals/Urinary Tract: Adrenal glands appear normal. Bilateral nonobstructive nephrolithiasis is noted. No hydronephrosis or renal obstruction is noted. Urinary bladder is unremarkable. Stomach/Bowel: Stomach is within normal limits. Appendix appears normal. No evidence of bowel wall thickening, distention, or inflammatory changes. Vascular/Lymphatic: Aortic atherosclerosis. No enlarged abdominal or pelvic lymph nodes. Reproductive: Prostate is unremarkable. Other: No abdominal wall hernia or abnormality. No abdominopelvic ascites. Musculoskeletal: Mildly displaced fractures are seen involving the right inferior and superior pubic rami. Status post old surgical internal fixation of proximal right femur. IMPRESSION: Mildly displaced fractures involving the right inferior and superior pubic rami. Bilateral nonobstructive nephrolithiasis. Aortic atherosclerosis. Coronary artery calcifications are noted suggesting coronary artery disease. Bronchial thickening and airspace opacity is noted posteriorly in superior segment of right lower lobe concerning for focal inflammation or possibly pneumonia. Old proximal right humeral neck fracture. Electronically Signed   By: Marijo Conception, M.D.   On: 04/26/2017 15:56   Ct Chest Wo Contrast  Result Date: 04/26/2017 CLINICAL DATA:  Right hip pain and abdominal distention after fall off roof 4 days ago EXAM: CT CHEST, ABDOMEN AND PELVIS WITHOUT CONTRAST TECHNIQUE: Multidetector CT imaging of the chest, abdomen and pelvis was performed following the standard protocol without IV contrast. COMPARISON:  CT scan of May 02, 2015. FINDINGS: CT CHEST FINDINGS Cardiovascular:  Atherosclerosis of thoracic aorta is noted without aneurysm formation. Coronary artery calcifications are noted. Normal cardiac size. No pericardial effusion. Mediastinum/Nodes: No enlarged mediastinal, hilar, or axillary lymph nodes. Thyroid gland, trachea, and esophagus demonstrate no significant findings. Lungs/Pleura: No pneumothorax or pleural effusion is noted. Mild bilateral posterior basilar subsegmental atelectasis is noted. Bronchial thickening and opacity is noted posteriorly in the superior segment of the right lower lobe concerning for focal inflammation. Musculoskeletal: Old proximal right humeral neck fracture is noted. CT ABDOMEN PELVIS FINDINGS Hepatobiliary: No focal liver abnormality is seen. No gallstones, gallbladder wall thickening, or biliary dilatation. Pancreas: Unremarkable. No pancreatic ductal dilatation or surrounding inflammatory changes. Spleen: Normal in size without focal abnormality. Adrenals/Urinary Tract: Adrenal glands appear normal. Bilateral nonobstructive nephrolithiasis is noted. No hydronephrosis or renal obstruction is noted. Urinary bladder is unremarkable. Stomach/Bowel: Stomach is within normal limits. Appendix appears normal. No evidence of bowel wall thickening, distention, or inflammatory changes. Vascular/Lymphatic: Aortic atherosclerosis. No enlarged abdominal or pelvic lymph nodes. Reproductive: Prostate is unremarkable. Other: No abdominal wall hernia or abnormality. No abdominopelvic ascites. Musculoskeletal: Mildly  displaced fractures are seen involving the right inferior and superior pubic rami. Status post old surgical internal fixation of proximal right femur. IMPRESSION: Mildly displaced fractures involving the right inferior and superior pubic rami. Bilateral nonobstructive nephrolithiasis. Aortic atherosclerosis. Coronary artery calcifications are noted suggesting coronary artery disease. Bronchial thickening and airspace opacity is noted posteriorly in  superior segment of right lower lobe concerning for focal inflammation or possibly pneumonia. Old proximal right humeral neck fracture. Electronically Signed   By: Marijo Conception, M.D.   On: 04/26/2017 15:56    ____________________________________________   PROCEDURES  Procedure(s) performed:   Procedures   ____________________________________________   INITIAL IMPRESSION / ASSESSMENT AND PLAN / ED COURSE  Patient appears dry and is slightly tachycardic and pale concern for possible intra-abdominal bleed as a result of his trauma the other day.  We will get a CT scan of his chest and abdomen and pelvis.  Type and screen, will give a dose of pain medicine however I also think it is possible constipation could be related to the narcotics. Fluids and urinalysis as well.   Persistent tachycardia, elevated BUN/Cr and constipation. Ct ok (not able to use contrast) otherwise. D/w Dr. Rosendo Gros with trauma surgery and does not feel there is any indication for admission or evaluation by them.  Also discussed with Dr. Percell Miller with orthopedics who did not think there is any orthopedic surgical intervention.  Patient will be admitted for dehydration and also possible pneumonia seen on CT scan so we will start antibiotics as his white count elevated to.  Suspect his constipation is from the narcotic pain medication.  Pertinent labs & imaging results that were available during my care of the patient were reviewed by me and considered in my medical decision making (see chart for details).  ____________________________________________  FINAL CLINICAL IMPRESSION(S) / ED DIAGNOSES  Final diagnoses:  Closed fracture of ramus of right pubis, initial encounter ([place])  AKI (acute kidney injury) ([place])     MEDICATIONS GIVEN DURING THIS VISIT:  Medications  ondansetron (ZOFRAN) 4 MG/2ML injection (not administered)  cefTRIAXone (ROCEPHIN) 1 g in dextrose 5 % 50 mL IVPB (not administered)  azithromycin  (ZITHROMAX) 500 mg in dextrose 5 % 250 mL IVPB (not administered)  morphine 4 MG/ML injection 6 mg (6 mg Intravenous Given 04/26/17 1352)  sodium chloride 0.9 % bolus 1,000 mL (0 mLs Intravenous Stopped 04/26/17 1449)  sodium chloride 0.9 % bolus 1,000 mL (1,000 mLs Intravenous New Bag/Given 04/26/17 1510)     NEW OUTPATIENT MEDICATIONS STARTED DURING THIS VISIT:  This SmartLink is deprecated. Use AVSMEDLIST instead to display the medication list for a patient.  Note:  This note was prepared with assistance of Dragon voice recognition software. Occasional wrong-word or sound-a-like substitutions may have occurred due to the inherent limitations of voice recognition software.   Merrily Pew, MD 04/26/17 [phone]

## 2017-04-26 NOTE — ED Notes (Signed)
Pt is able to have VQ scan in the morning

## 2017-04-26 NOTE — ED Notes (Signed)
MD at bedside. 

## 2017-04-26 NOTE — ED Triage Notes (Addendum)
Fell 30 feet off a roof Friday.  Seen here. C/o right hip pain. Lower abdominal distention.  States he hasn't been able to get up since Friday.   CBG 198

## 2017-04-26 NOTE — H&P (Addendum)
TRH H&P   Patient Demographics:    Daniel Campos, is a 61 y.o. male  MRN: [id number]   DOB - 10-15-1955  Admit Date - 04/26/2017  Outpatient Primary MD for the patient is [facility], [facility]  Referring MD/NP/PA:   Merrily Pew  Outpatient Specialists:   Patient coming from: home  Chief Complaint  Patient presents with  . Fall  . Abdominal Pain      HPI:    Daniel Campos  is a 61 y.o. male, w hypertension, dm2, bph who  apparently had recent fall with pubic rami fracture.  Unable to move and walk. Over the weekend his po intake has decreased.   Pt has had decrease in urine output.  Pt notes that he has been taking ibuprofen 2-4 pills per day. His lower abdomen was swelling slightly and due to decrease in urine output pt presented to Ed.    In ED.  CT chest/ abd/pelvis IMPRESSION: Mildly displaced fractures involving the right inferior and superior pubic rami.  Bilateral nonobstructive nephrolithiasis.  Aortic atherosclerosis.  Coronary artery calcifications are noted suggesting coronary artery disease.  Bronchial thickening and airspace opacity is noted posteriorly in superior segment of right lower lobe concerning for focal inflammation or possibly pneumonia.  Old proximal right humeral neck fracture.  Wbc 16.0,  Hgb 8.7, Plt 234 Na 131, K 5.1,  Bun 93, Creatinine 2.69 Ast 16, Alt 15, Alb 3.2 Hco3=19 Glucose 166 Lipase 16 Trop <0.03  Pt will be admitted for ARF, hyponatremia, and possible RLL pneumonia.    Review of systems:    In addition to the HPI above,  + slight dyspnea for the past few days.  No Fever-chills, No Headache, No changes with Vision or hearing, No problems swallowing food or Liquids, No Chest pain, Cough  No Abdominal pain, No Nausea or Vommitting, Bowel movements are regular, No Blood in stool or Urine, No  dysuria, No new skin rashes or bruises, No new joints pains-aches,  No new weakness, tingling, numbness in any extremity, No recent weight gain or loss, No polyuria, polydypsia or polyphagia, No significant Mental Stressors.  A full 10 point Review of Systems was done, except as stated above, all other Review of Systems were negative.   With Past History of the following :    Past Medical History:  Diagnosis Date  . Diabetes mellitus without complication ([place])   . Hypertension   . Prostate hypertrophy       Past Surgical History:  Procedure Laterality Date  . INTRAMEDULLARY (IM) NAIL INTERTROCHANTERIC Right 12/06/2012   Procedure: INTRAMEDULLARY (IM) NAIL INTERTROCHANTRIC;  Surgeon: Carole Civil, MD;  Location: [facility];  Service: Orthopedics;  Laterality: Right;  after lunch   . KNEE SURGERY    . SHOULDER SURGERY        Social History:     Social History  Tobacco Use  . Smoking status: Former Smoker    Packs/day: 1.00    Types: E-cigarettes  . Smokeless tobacco: Never Used  Substance Use Topics  . Alcohol use: No     Lives - at home  Mobility - walks by self prior to accident   Family History :     Family History  Problem Relation Age of Onset  . Heart attack Mother   . Colon polyps Neg Hx   . Colon cancer Neg Hx       Home Medications:   Prior to Admission medications   Medication Sig Start Date End Date Taking? Authorizing Provider  aspirin 81 MG tablet Take 81 mg by mouth daily.   Yes [provider]  B Complex-C (B-COMPLEX WITH VITAMIN C) tablet Take 1 tablet by mouth daily.   Yes [provider]  capsaicin (ZOSTRIX) 0.025 % cream Apply 1 application topically 3 (three) times daily.   Yes [provider]  Cholecalciferol (VITAMIN D PO) Take 1 tablet by mouth daily.   Yes [provider]  gabapentin (NEURONTIN) 300 MG capsule Take 1 capsule by mouth 3 (three) times daily.   Yes [provider]    insulin detemir (LEVEMIR) 100 UNIT/ML injection Inject 26 Units into the skin at bedtime.    Yes [provider]  Melatonin 10 MG TABS Take 10 mg by mouth at bedtime.   Yes [provider]  meloxicam (MOBIC) 15 MG tablet Take 15 mg by mouth daily.   Yes [provider]  NOVOLOG FLEXPEN 100 UNIT/ML FlexPen Inject 5 Units into the skin 3 (three) times daily with meals.  08/26/13  Yes [provider]  omeprazole (PRILOSEC) 40 MG capsule Take 40 mg by mouth every morning.   Yes [provider]  ondansetron (ZOFRAN) 4 MG tablet Take 1 tablet (4 mg total) by mouth every 8 (eight) hours as needed for nausea or vomiting. 04/22/17  Yes Forde Dandy, MD  oxyCODONE-acetaminophen (PERCOCET/ROXICET) 5-325 MG tablet Take 1-2 tablets by mouth every 6 (six) hours as needed for severe pain. 04/22/17  Yes Forde Dandy, MD  pravastatin (PRAVACHOL) 10 MG tablet Take 5 mg by mouth daily.    [provider]  ranitidine (ZANTAC) 150 MG tablet Take 300 mg by mouth at bedtime.    [provider]     Allergies:    No Known Allergies   Physical Exam:   Vitals  Blood pressure (!) 146/84, pulse (!) 123, temperature (!) 97.5 F (36.4 C), temperature source Oral, resp. rate 19, SpO2 98 %.   1. General  lying in bed in NAD,   2. Normal affect and insight, Not Suicidal or Homicidal, Awake Alert, Oriented X 3.  3. No F.N deficits, ALL C.Nerves Intact, Strength 5/5 all 4 extremities, Sensation intact all 4 extremities, Plantars down going.  4. Ears and Eyes appear Normal, Conjunctivae clear, PERRLA. Mucous membranes dry.  5. Supple Neck, No JVD, No cervical lymphadenopathy appriciated, No Carotid Bruits.  6. Symmetrical Chest wall movement, Good air movement bilaterally, CTAB.  7. Tachy s1, s2, , No Gallops, Rubs or Murmurs, No Parasternal Heave.  8. Positive Bowel Sounds, Abdomen Soft, No tenderness, No organomegaly appriciated,No rebound -guarding  or rigidity.  9.  No Cyanosis, Normal Skin Turgor,  10. Good muscle tone,  joints appear normal , no effusions, Normal ROM.  11. No Palpable Lymph Nodes in Neck or Axillae  Slight bruise over lower abdomen  Data Review:    CBC Recent Labs  Lab 04/22/17 1217 04/26/17 1402  WBC 11.0* 16.0*  HGB 10.3* 8.7*  HCT 31.7* 26.8*  PLT 263 234  MCV 87.3 88.7  MCH 28.4 28.8  MCHC 32.5 32.5  RDW 12.8 13.3  LYMPHSABS 1.7 0.8  MONOABS 0.7 1.2*  EOSABS 0.2 0.0  BASOSABS 0.0 0.0   ------------------------------------------------------------------------------------------------------------------  Chemistries  Recent Labs  Lab 04/22/17 1217 04/26/17 1402  NA 137 131*  K 5.0 5.1  CL 106 98*  CO2 27 19*  GLUCOSE 157* 166*  BUN 21* 93*  CREATININE 0.84 2.69*  CALCIUM 9.1 7.9*  AST 18 16  ALT 20 15*  ALKPHOS 123 90  BILITOT 0.4 1.0   ------------------------------------------------------------------------------------------------------------------ estimated creatinine clearance is 30.3 mL/min (A) (by C-G formula based on SCr of 2.69 mg/dL (H)). ------------------------------------------------------------------------------------------------------------------ No results for input(s): TSH, T4TOTAL, T3FREE, THYROIDAB in the last 72 hours.  Invalid input(s): FREET3  Coagulation profile No results for input(s): INR, PROTIME in the last 168 hours. ------------------------------------------------------------------------------------------------------------------- No results for input(s): DDIMER in the last 72 hours. -------------------------------------------------------------------------------------------------------------------  Cardiac Enzymes Recent Labs  Lab 04/26/17 1506  TROPONINI <0.03   ------------------------------------------------------------------------------------------------------------------ No results found for:  BNP   ---------------------------------------------------------------------------------------------------------------  Urinalysis    Component Value Date/Time   COLORURINE YELLOW 04/26/2017 [place] 04/26/2017 1352   LABSPEC 1.015 04/26/2017 1352   PHURINE 5.0 04/26/2017 1352   GLUCOSEU 150 (A) 04/26/2017 1352   HGBUR SMALL (A) 04/26/2017 1352   BILIRUBINUR NEGATIVE 04/26/2017 1352   KETONESUR NEGATIVE 04/26/2017 1352   PROTEINUR NEGATIVE 04/26/2017 1352   UROBILINOGEN 0.2 11/16/2010 1144   NITRITE NEGATIVE 04/26/2017 1352   LEUKOCYTESUR NEGATIVE 04/26/2017 1352    ----------------------------------------------------------------------------------------------------------------   Imaging Results:    Ct Abdomen Pelvis Wo Contrast  Result Date: 04/26/2017 CLINICAL DATA:  Right hip pain and abdominal distention after fall off roof 4 days ago EXAM: CT CHEST, ABDOMEN AND PELVIS WITHOUT CONTRAST TECHNIQUE: Multidetector CT imaging of the chest, abdomen and pelvis was performed following the standard protocol without IV contrast. COMPARISON:  CT scan of May 02, 2015. FINDINGS: CT CHEST FINDINGS Cardiovascular: Atherosclerosis of thoracic aorta is noted without aneurysm formation. Coronary artery calcifications are noted. Normal cardiac size. No pericardial effusion. Mediastinum/Nodes: No enlarged mediastinal, hilar, or axillary lymph nodes. Thyroid gland, trachea, and esophagus demonstrate no significant findings. Lungs/Pleura: No pneumothorax or pleural effusion is noted. Mild bilateral posterior basilar subsegmental atelectasis is noted. Bronchial thickening and opacity is noted posteriorly in the superior segment of the right lower lobe concerning for focal inflammation. Musculoskeletal: Old proximal right humeral neck fracture is noted. CT ABDOMEN PELVIS FINDINGS Hepatobiliary: No focal liver abnormality is seen. No gallstones, gallbladder wall thickening, or biliary  dilatation. Pancreas: Unremarkable. No pancreatic ductal dilatation or surrounding inflammatory changes. Spleen: Normal in size without focal abnormality. Adrenals/Urinary Tract: Adrenal glands appear normal. Bilateral nonobstructive nephrolithiasis is noted. No hydronephrosis or renal obstruction is noted. Urinary bladder is unremarkable. Stomach/Bowel: Stomach is within normal limits. Appendix appears normal. No evidence of bowel wall thickening, distention, or inflammatory changes. Vascular/Lymphatic: Aortic atherosclerosis. No enlarged abdominal or pelvic lymph nodes. Reproductive: Prostate is unremarkable. Other: No abdominal wall hernia or abnormality. No abdominopelvic ascites. Musculoskeletal: Mildly displaced fractures are seen involving the right inferior and superior pubic rami. Status post old surgical internal fixation of proximal right femur. IMPRESSION: Mildly displaced fractures involving the right inferior and superior pubic rami. Bilateral nonobstructive nephrolithiasis. Aortic atherosclerosis. Coronary artery calcifications are  noted suggesting coronary artery disease. Bronchial thickening and airspace opacity is noted posteriorly in superior segment of right lower lobe concerning for focal inflammation or possibly pneumonia. Old proximal right humeral neck fracture. Electronically Signed   By: Marijo Conception, M.D.   On: 04/26/2017 15:56   Ct Chest Wo Contrast  Result Date: 04/26/2017 CLINICAL DATA:  Right hip pain and abdominal distention after fall off roof 4 days ago EXAM: CT CHEST, ABDOMEN AND PELVIS WITHOUT CONTRAST TECHNIQUE: Multidetector CT imaging of the chest, abdomen and pelvis was performed following the standard protocol without IV contrast. COMPARISON:  CT scan of May 02, 2015. FINDINGS: CT CHEST FINDINGS Cardiovascular: Atherosclerosis of thoracic aorta is noted without aneurysm formation. Coronary artery calcifications are noted. Normal cardiac size. No pericardial  effusion. Mediastinum/Nodes: No enlarged mediastinal, hilar, or axillary lymph nodes. Thyroid gland, trachea, and esophagus demonstrate no significant findings. Lungs/Pleura: No pneumothorax or pleural effusion is noted. Mild bilateral posterior basilar subsegmental atelectasis is noted. Bronchial thickening and opacity is noted posteriorly in the superior segment of the right lower lobe concerning for focal inflammation. Musculoskeletal: Old proximal right humeral neck fracture is noted. CT ABDOMEN PELVIS FINDINGS Hepatobiliary: No focal liver abnormality is seen. No gallstones, gallbladder wall thickening, or biliary dilatation. Pancreas: Unremarkable. No pancreatic ductal dilatation or surrounding inflammatory changes. Spleen: Normal in size without focal abnormality. Adrenals/Urinary Tract: Adrenal glands appear normal. Bilateral nonobstructive nephrolithiasis is noted. No hydronephrosis or renal obstruction is noted. Urinary bladder is unremarkable. Stomach/Bowel: Stomach is within normal limits. Appendix appears normal. No evidence of bowel wall thickening, distention, or inflammatory changes. Vascular/Lymphatic: Aortic atherosclerosis. No enlarged abdominal or pelvic lymph nodes. Reproductive: Prostate is unremarkable. Other: No abdominal wall hernia or abnormality. No abdominopelvic ascites. Musculoskeletal: Mildly displaced fractures are seen involving the right inferior and superior pubic rami. Status post old surgical internal fixation of proximal right femur. IMPRESSION: Mildly displaced fractures involving the right inferior and superior pubic rami. Bilateral nonobstructive nephrolithiasis. Aortic atherosclerosis. Coronary artery calcifications are noted suggesting coronary artery disease. Bronchial thickening and airspace opacity is noted posteriorly in superior segment of right lower lobe concerning for focal inflammation or possibly pneumonia. Old proximal right humeral neck fracture. Electronically  Signed   By: Marijo Conception, M.D.   On: 04/26/2017 15:56     Assessment & Plan:    Principal Problem:   ARF (acute renal failure) (HCC) Active Problems:   Diabetes ([place])   Essential hypertension, benign   Tachycardia   Anemia   Hyponatremia    ARF Check urine sodium, urine creatinine, urine eosinophils Hydrate with ns iv Check cmp in am NO NSAIDS, DC meloxicam  Tachycardia  Tele Trop I q6h x3 tsh D dimer If d dimer is positive then VQ scan r/o PE Check cardiac echo  CAP Blood culture x2 Urine legionella antigen Urine strep antigen  Hyponatremia Check serum osm, tsh, cortisol Check  Urine sodium, urine osm  Anemia Check ferritin, iron, tibc, b12, folate, esr Consider spep, upep Check cbc in am  Dm2 fsbs ac and qhs, ISS  Hyperlipidemia Check lipid in am  Hypertension Per history but not sure if on medication  DVT Prophylaxis  Lovenox- SCDs   AM Labs Ordered, also please review Full Orders  Family Communication: Admission, patients condition and plan of care including tests being ordered have been discussed with the patient  who indicate understanding and agree with the plan and Code Status.  Code Status FULL CODE  Likely DC to  TBD  Condition GUARDED    Consults called: none  Admission status: inpatient   Time spent in minutes : 45   Jani Gravel M.D on 04/26/2017 at 6:05 PM  Between 7am to 7pm - Pager - [phone]. After 7pm go to [email or web address] - password [facility]  [facility] - Office  [phone]

## 2017-04-26 NOTE — Progress Notes (Signed)
ANTICOAGULATION CONSULT NOTE - Initial Consult  Pharmacy Consult for HEPARIN Indication: pulmonary embolus  No Known Allergies  Patient Measurements:    Vital Signs: Temp: 97.8 F (36.6 C) (12/11 2052) Temp Source: Oral (12/11 2052) BP: 153/73 (12/11 2052) Pulse Rate: 113 (12/11 2052)  Labs: Recent Labs    04/26/17 1402 04/26/17 1506 04/26/17 1827  HGB 8.7*  --   --   HCT 26.8*  --   --   PLT 234  --   --   CREATININE 2.69*  --   --   TROPONINI  --  <0.03 0.04*    Estimated Creatinine Clearance: 30.3 mL/min (A) (by C-G formula based on SCr of 2.69 mg/dL (H)).   Medical History: Past Medical History:  Diagnosis Date  . Diabetes mellitus without complication ([place])   . Hypertension   . Prostate hypertrophy     Medications:  Medications Prior to Admission  Medication Sig Dispense Refill Last Dose  . aspirin 81 MG tablet Take 81 mg by mouth daily.   04/26/2017 at 1000  . B Complex-C (B-COMPLEX WITH VITAMIN C) tablet Take 1 tablet by mouth daily.   04/26/2017 at 1000  . capsaicin (ZOSTRIX) 0.025 % cream Apply 1 application topically 3 (three) times daily.   04/25/2017 at Unknown time  . Cholecalciferol (VITAMIN D PO) Take 1 tablet by mouth daily.   04/25/2017 at Unknown time  . gabapentin (NEURONTIN) 300 MG capsule Take 1 capsule by mouth 3 (three) times daily.   04/26/2017 at 100  . insulin detemir (LEVEMIR) 100 UNIT/ML injection Inject 26 Units into the skin at bedtime.    04/25/2017 at 2300  . Melatonin 10 MG TABS Take 10 mg by mouth at bedtime.   04/25/2017 at 2300  . meloxicam (MOBIC) 15 MG tablet Take 15 mg by mouth daily.   04/26/2017 at 1000  . NOVOLOG FLEXPEN 100 UNIT/ML FlexPen Inject 5 Units into the skin 3 (three) times daily with meals.    Past Week at Unknown time  . omeprazole (PRILOSEC) 40 MG capsule Take 40 mg by mouth every morning.   04/26/2017 at 1000  . ondansetron (ZOFRAN) 4 MG tablet Take 1 tablet (4 mg total) by mouth every 8 (eight) hours as  needed for nausea or vomiting. 20 tablet 0 04/25/2017 at Unknown time  . oxyCODONE-acetaminophen (PERCOCET/ROXICET) 5-325 MG tablet Take 1-2 tablets by mouth every 6 (six) hours as needed for severe pain. 20 tablet 0 04/26/2017 at 1000  . pravastatin (PRAVACHOL) 10 MG tablet Take 5 mg by mouth daily.   Not Taking at Unknown time  . ranitidine (ZANTAC) 150 MG tablet Take 300 mg by mouth at bedtime.   Not Taking at Unknown time    Assessment: 61yo male with suspected PE.  Asked to initiate Heparin.  Goal of Therapy:  Heparin level 0.3-0.7 units/ml Monitor platelets by anticoagulation protocol: Yes   Plan:   Heparin5000 units IV now x 1  Heparin infusion at 1300 units/hr  Heparin level daily  CBC daily while on Heparin  [place] Crane, Nickey Kloepfer A 04/26/2017,9:13 PM

## 2017-04-26 NOTE — Telephone Encounter (Signed)
Daniel Campos' family member Daniel Campos, called in and stated that he had been to the ED on the 7th and had a fractured pelvis.  She said that he was in a lot of pain and was asking questions about what to do and maybe if home health could come in to see him.  I spoke with Dr. Aline Brochure and told him of the situation.  He said he would need to see him.   I called her back and told her that Dr. Aline Brochure stated he would need to see him and that we needed to make an appointment.  She said that EMS was there at the home and they were transferring him to the hospital.  She said Daniel Campos pain had just gotten out of hand and he needed to go back to the hospital.

## 2017-04-27 ENCOUNTER — Other Ambulatory Visit: Payer: Self-pay

## 2017-04-27 DIAGNOSIS — S32592A Other specified fracture of left pubis, initial encounter for closed fracture: Secondary | ICD-10-CM

## 2017-04-27 DIAGNOSIS — R338 Other retention of urine: Secondary | ICD-10-CM

## 2017-04-27 LAB — HEPARIN LEVEL (UNFRACTIONATED): Heparin Unfractionated: 0.62 IU/mL (ref 0.30–0.70)

## 2017-04-27 LAB — COMPREHENSIVE METABOLIC PANEL
ALK PHOS: 86 U/L (ref 38–126)
ALT: 15 U/L — AB (ref 17–63)
ANION GAP: 12 (ref 5–15)
AST: 16 U/L (ref 15–41)
Albumin: 3 g/dL — ABNORMAL LOW (ref 3.5–5.0)
BUN: 87 mg/dL — ABNORMAL HIGH (ref 6–20)
CALCIUM: 7.9 mg/dL — AB (ref 8.9–10.3)
CHLORIDE: 101 mmol/L (ref 101–111)
CO2: 19 mmol/L — ABNORMAL LOW (ref 22–32)
CREATININE: 2.09 mg/dL — AB (ref 0.61–1.24)
GFR, EST AFRICAN AMERICAN: 37 mL/min — AB (ref >60)
GFR, EST NON AFRICAN AMERICAN: 32 mL/min — AB (ref >60)
Glucose, Bld: 284 mg/dL — ABNORMAL HIGH (ref 65–99)
Potassium: 5 mmol/L (ref 3.5–5.1)
SODIUM: 132 mmol/L — AB (ref 135–145)
Total Bilirubin: 1.1 mg/dL (ref 0.3–1.2)
Total Protein: 7 g/dL (ref 6.5–8.1)

## 2017-04-27 LAB — GLUCOSE, CAPILLARY
GLUCOSE-CAPILLARY: 165 mg/dL — AB (ref 65–99)
GLUCOSE-CAPILLARY: 153 mg/dL — AB (ref 65–99)
Glucose-Capillary: 248 mg/dL — ABNORMAL HIGH (ref 65–99)
Glucose-Capillary: 234 mg/dL — ABNORMAL HIGH (ref 65–99)

## 2017-04-27 LAB — CBC
HCT: 26.3 % — ABNORMAL LOW (ref 39.0–52.0)
Hemoglobin: 8.6 g/dL — ABNORMAL LOW (ref 13.0–17.0)
MCH: 29.2 pg (ref 26.0–34.0)
MCHC: 32.7 g/dL (ref 30.0–36.0)
MCV: 89.2 fL (ref 78.0–100.0)
PLATELETS: 232 10*3/uL (ref 150–400)
RBC: 2.95 MIL/uL — AB (ref 4.22–5.81)
RDW: 13.3 % (ref 11.5–15.5)
WBC: 17.9 10*3/uL — ABNORMAL HIGH (ref 4.0–10.5)

## 2017-04-27 LAB — IRON AND TIBC
IRON: 11 ug/dL — AB (ref 45–182)
SATURATION RATIOS: 5 % — AB (ref 17.9–39.5)
TIBC: 214 ug/dL — ABNORMAL LOW (ref 250–450)
UIBC: 203 ug/dL

## 2017-04-27 LAB — TROPONIN I
Troponin I: 0.08 ng/mL (ref <0.03)
Troponin I: 0.11 ng/mL (ref <0.03)

## 2017-04-27 LAB — LIPID PANEL
CHOLESTEROL: 140 mg/dL (ref 0–200)
HDL: 57 mg/dL (ref >40)
LDL Cholesterol: 69 mg/dL (ref 0–99)
TRIGLYCERIDES: 69 mg/dL (ref <150)
Total CHOL/HDL Ratio: 2.5 RATIO
VLDL: 14 mg/dL (ref 0–40)

## 2017-04-27 LAB — FOLATE RBC
Folate, Hemolysate: 369.9 ng/mL
Folate, RBC: 1456 ng/mL (ref >498)
Hematocrit: 25.4 % — ABNORMAL LOW (ref 37.5–51.0)

## 2017-04-27 LAB — CORTISOL: CORTISOL PLASMA: 39.1 ug/dL

## 2017-04-27 LAB — FERRITIN: FERRITIN: 154 ng/mL (ref 24–336)

## 2017-04-27 LAB — VITAMIN B12: VITAMIN B 12: 236 pg/mL (ref 180–914)

## 2017-04-27 LAB — OSMOLALITY, URINE: OSMOLALITY UR: 472 mosm/kg (ref 300–900)

## 2017-04-27 MED ORDER — ORAL CARE MOUTH RINSE
15.0000 mL | Freq: Two times a day (BID) | OROMUCOSAL | Status: DC
  Administered 2017-04-28 – 2017-04-29 (×3): 15 mL via OROMUCOSAL

## 2017-04-27 NOTE — Progress Notes (Signed)
Pt c/o of urge to urinate but unable. Bladder scan revealed 253mL. Encouraged pt to try and urinate again. Condom cath in place. Pt able to urinate 30-57mL's at this time. Encouraged pt to push PO fluids. Will continue to monitor.

## 2017-04-27 NOTE — Progress Notes (Signed)
Pt serum osmolality 322, MD made aware. No new orders. Will continue to monitor.

## 2017-04-27 NOTE — Progress Notes (Addendum)
PROGRESS NOTE    Daniel Campos  OVZ:[id number] DOB: 20-Jul-1955 DOA: 04/26/2017 PCP: [name]     Brief Narrative:  61 year old man admitted from home on 12/11.  He fell at home about 5 days prior came to the ED and was diagnosed with a pelvic fracture, was sent home on oxycodone.  Over the weekend his family notes that he has been very sedentarydue to pain, has had decreased appetite and increased weakness so they bring him back to the hospital today.  In the hospital he complains of abdominal pain.  He has also had decreased urinary output.  Admission was requested.   Assessment & Plan:   Principal Problem:   ARF (acute renal failure) (HCC) Active Problems:   Diabetes ([place])   Essential hypertension, benign   Tachycardia   Anemia   Hyponatremia   Acute urinary retention   Acute renal failure -I suspect this is mainly due to acute urinary retention with also possibly a prerenal component due to decreased p.o. intake with pain from pelvic fracture, likely due to narcotics that he has been receiving for his pelvic fracture pain. -When I assessed him today he complained of significant abdominal pain and had significant edema of his lower abdomen.  Upon discussion with RN he has had decreased urine output. -Decision was made to Place Foley catheter after which he had about 1600 cc urine output and had immediate relief of pain. -We will leave Foley in for now. -Recheck renal function in a.m.   acute hypoxemic respiratory failure/sinus tachycardia -Very high pretest probability for PE especially in a patient with a recent pelvic fracture and sedentary state. -He has also been complaining of right thigh pain. -Renal dysfunction precludes a CT angiogram, will request VQ scan.  If VQ scan with low probability believe we need to go a step further and do an ultrasound of his right lower extremity. -For now remains on IV heparin. -Also questionable pneumonia based on CT  chest without contrast, will continue IV antibiotics for now, however high probability of de-escalating to oral and possibly discontinuing antibiotics altogether pending results of VQ scan.  Elevated troponin -Flat, no chest pain, likely due to acute hypoxemic respiratory failure. -We will check 2D echo, as long as ejection fraction is normal and there are no wall motion abnormalities do not anticipate further cardiac workup this admission.  Diabetes -Fair control, continue current management, adjust as needed.   DVT prophylaxis: IV heparin Code Status: Full code Family Communication: Daughter at bedside updated on plan of care and questions answered Disposition Plan: Pending medical stability  Consultants:   None  Procedures:   None  Antimicrobials:  Anti-infectives (From admission, onward)   Start     Dose/Rate Route Frequency Ordered Stop   04/27/17 2000  azithromycin (ZITHROMAX) 500 mg in dextrose 5 % 250 mL IVPB     500 mg 250 mL/hr over 60 Minutes Intravenous Every 24 hours 04/26/17 2034 05/03/17 1959   04/27/17 1800  cefTRIAXone (ROCEPHIN) 1 g in dextrose 5 % 50 mL IVPB     1 g 100 mL/hr over 30 Minutes Intravenous Every 24 hours 04/26/17 2034 05/03/17 1759   04/26/17 1815  cefTRIAXone (ROCEPHIN) 1 g in dextrose 5 % 50 mL IVPB     1 g 100 mL/hr over 30 Minutes Intravenous  Once 04/26/17 1808 04/26/17 1928   04/26/17 1815  azithromycin (ZITHROMAX) 500 mg in dextrose 5 % 250 mL IVPB     500  mg 250 mL/hr over 60 Minutes Intravenous  Once 04/26/17 1808 04/26/17 2023       Subjective: Significant lower abdominal pain as well as right thigh pain, weak  Objective: Vitals:   04/26/17 2035 04/26/17 2052 04/27/17 0704 04/27/17 1500  BP:  (!) 153/73 (!) 142/68 139/70  Pulse:  (!) 113 (!) 101 (!) 106  Resp:  19 20 18   Temp:  97.8 F (36.6 C) 97.8 F (36.6 C) 97.8 F (36.6 C)  TempSrc:  Oral Oral Oral  SpO2: 94% 94% 95% 95%  Weight:  79.9 kg (176 lb 2.4 oz)      Height:  5\' 11"  (1.803 m)      Intake/Output Summary (Last 24 hours) at 04/27/2017 1840 Last data filed at 04/27/2017 1757 Gross per 24 hour  Intake 2434.18 ml  Output 1700 ml  Net 734.18 ml   Filed Weights   04/26/17 2052  Weight: 79.9 kg (176 lb 2.4 oz)    Examination:  General exam: Alert, awake, oriented x 3 Respiratory system: Clear to auscultation. Respiratory effort normal. Cardiovascular system:RRR. No murmurs, rubs, gallops. Gastrointestinal system: Abdomen is distended to the suprapubic area. Normal bowel sounds heard. Central nervous system: Alert and oriented. No focal neurological deficits. Extremities: Right thigh and calf with edema, no edema of the left, positive pulses Skin: No rashes, lesions or ulcers Psychiatry: Judgement and insight appear normal. Mood & affect appropriate.     Data Reviewed: I have personally reviewed following labs and imaging studies  CBC: Recent Labs  Lab 04/22/17 1217 04/26/17 1402 04/26/17 1827 04/27/17 0533  WBC 11.0* 16.0*  --  17.9*  NEUTROABS 8.3* 14.0*  --   --   HGB 10.3* 8.7*  --  8.6*  HCT 31.7* 26.8* 25.4* 26.3*  MCV 87.3 88.7  --  89.2  PLT 263 234  --  979   Basic Metabolic Panel: Recent Labs  Lab 04/22/17 1217 04/26/17 1402 04/27/17 0533  NA 137 131* 132*  K 5.0 5.1 5.0  CL 106 98* 101  CO2 27 19* 19*  GLUCOSE 157* 166* 284*  BUN 21* 93* 87*  CREATININE 0.84 2.69* 2.09*  CALCIUM 9.1 7.9* 7.9*   GFR: Estimated Creatinine Clearance: 39 mL/min (A) (by C-G formula based on SCr of 2.09 mg/dL (H)). Liver Function Tests: Recent Labs  Lab 04/22/17 1217 04/26/17 1402 04/27/17 0533  AST 18 16 16   ALT 20 15* 15*  ALKPHOS 123 90 86  BILITOT 0.4 1.0 1.1  PROT 7.0 7.1 7.0  ALBUMIN 3.8 3.2* 3.0*   Recent Labs  Lab 04/26/17 1402  LIPASE 16   No results for input(s): AMMONIA in the last 168 hours. Coagulation Profile: No results for input(s): INR, PROTIME in the last 168 hours. Cardiac  Enzymes: Recent Labs  Lab 04/26/17 1506 04/26/17 1827 04/26/17 2354 04/27/17 0533  TROPONINI <0.03 0.04* 0.08* 0.11*   BNP (last 3 results) No results for input(s): PROBNP in the last 8760 hours. HbA1C: No results for input(s): HGBA1C in the last 72 hours. CBG: Recent Labs  Lab 04/22/17 1601 04/26/17 2220 04/27/17 0808 04/27/17 1109 04/27/17 1734  GLUCAP 266* 267* 248* 234* 165*   Lipid Profile: Recent Labs    04/27/17 0533  CHOL 140  HDL 57  LDLCALC 69  TRIG 69  CHOLHDL 2.5   Thyroid Function Tests: Recent Labs    04/26/17 1827  TSH 1.440   Anemia Panel: Recent Labs    04/26/17 1830  VITAMINB12 236  FERRITIN 154  TIBC 214*  IRON 11*   Urine analysis:    Component Value Date/Time   COLORURINE YELLOW 04/26/2017 [place] 04/26/2017 1352   LABSPEC 1.015 04/26/2017 1352   PHURINE 5.0 04/26/2017 1352   GLUCOSEU 150 (A) 04/26/2017 1352   HGBUR SMALL (A) 04/26/2017 1352   BILIRUBINUR NEGATIVE 04/26/2017 1352   KETONESUR NEGATIVE 04/26/2017 1352   PROTEINUR NEGATIVE 04/26/2017 1352   UROBILINOGEN 0.2 11/16/2010 1144   NITRITE NEGATIVE 04/26/2017 1352   LEUKOCYTESUR NEGATIVE 04/26/2017 1352   Sepsis Labs: @LABRCNTIP (procalcitonin:4,lacticidven:4)  ) Recent Results (from the past 240 hour(s))  Blood culture (routine x 2)     Status: None (Preliminary result)   Collection Time: 04/26/17  6:27 PM  Result Value Ref Range Status   Specimen Description RIGHT ANTECUBITAL  Final   Special Requests   Final    BOTTLES DRAWN AEROBIC AND ANAEROBIC Blood Culture results may not be optimal due to an inadequate volume of blood received in culture bottles   Culture NO GROWTH < 24 HOURS  Final   Report Status PENDING  Incomplete  Blood culture (routine x 2)     Status: None (Preliminary result)   Collection Time: 04/26/17  6:27 PM  Result Value Ref Range Status   Specimen Description BLOOD RIGHT HAND  Final   Special Requests   Final    BOTTLES  DRAWN AEROBIC AND ANAEROBIC Blood Culture results may not be optimal due to an inadequate volume of blood received in culture bottles   Culture NO GROWTH < 24 HOURS  Final   Report Status PENDING  Incomplete         Radiology Studies: Ct Abdomen Pelvis Wo Contrast  Result Date: 04/26/2017 CLINICAL DATA:  Right hip pain and abdominal distention after fall off roof 4 days ago EXAM: CT CHEST, ABDOMEN AND PELVIS WITHOUT CONTRAST TECHNIQUE: Multidetector CT imaging of the chest, abdomen and pelvis was performed following the standard protocol without IV contrast. COMPARISON:  CT scan of May 02, 2015. FINDINGS: CT CHEST FINDINGS Cardiovascular: Atherosclerosis of thoracic aorta is noted without aneurysm formation. Coronary artery calcifications are noted. Normal cardiac size. No pericardial effusion. Mediastinum/Nodes: No enlarged mediastinal, hilar, or axillary lymph nodes. Thyroid gland, trachea, and esophagus demonstrate no significant findings. Lungs/Pleura: No pneumothorax or pleural effusion is noted. Mild bilateral posterior basilar subsegmental atelectasis is noted. Bronchial thickening and opacity is noted posteriorly in the superior segment of the right lower lobe concerning for focal inflammation. Musculoskeletal: Old proximal right humeral neck fracture is noted. CT ABDOMEN PELVIS FINDINGS Hepatobiliary: No focal liver abnormality is seen. No gallstones, gallbladder wall thickening, or biliary dilatation. Pancreas: Unremarkable. No pancreatic ductal dilatation or surrounding inflammatory changes. Spleen: Normal in size without focal abnormality. Adrenals/Urinary Tract: Adrenal glands appear normal. Bilateral nonobstructive nephrolithiasis is noted. No hydronephrosis or renal obstruction is noted. Urinary bladder is unremarkable. Stomach/Bowel: Stomach is within normal limits. Appendix appears normal. No evidence of bowel wall thickening, distention, or inflammatory changes.  Vascular/Lymphatic: Aortic atherosclerosis. No enlarged abdominal or pelvic lymph nodes. Reproductive: Prostate is unremarkable. Other: No abdominal wall hernia or abnormality. No abdominopelvic ascites. Musculoskeletal: Mildly displaced fractures are seen involving the right inferior and superior pubic rami. Status post old surgical internal fixation of proximal right femur. IMPRESSION: Mildly displaced fractures involving the right inferior and superior pubic rami. Bilateral nonobstructive nephrolithiasis. Aortic atherosclerosis. Coronary artery calcifications are noted suggesting coronary artery disease. Bronchial thickening and airspace opacity is noted posteriorly in superior  segment of right lower lobe concerning for focal inflammation or possibly pneumonia. Old proximal right humeral neck fracture. Electronically Signed   By: Marijo Conception, M.D.   On: 04/26/2017 15:56   Ct Chest Wo Contrast  Result Date: 04/26/2017 CLINICAL DATA:  Right hip pain and abdominal distention after fall off roof 4 days ago EXAM: CT CHEST, ABDOMEN AND PELVIS WITHOUT CONTRAST TECHNIQUE: Multidetector CT imaging of the chest, abdomen and pelvis was performed following the standard protocol without IV contrast. COMPARISON:  CT scan of May 02, 2015. FINDINGS: CT CHEST FINDINGS Cardiovascular: Atherosclerosis of thoracic aorta is noted without aneurysm formation. Coronary artery calcifications are noted. Normal cardiac size. No pericardial effusion. Mediastinum/Nodes: No enlarged mediastinal, hilar, or axillary lymph nodes. Thyroid gland, trachea, and esophagus demonstrate no significant findings. Lungs/Pleura: No pneumothorax or pleural effusion is noted. Mild bilateral posterior basilar subsegmental atelectasis is noted. Bronchial thickening and opacity is noted posteriorly in the superior segment of the right lower lobe concerning for focal inflammation. Musculoskeletal: Old proximal right humeral neck fracture is noted.  CT ABDOMEN PELVIS FINDINGS Hepatobiliary: No focal liver abnormality is seen. No gallstones, gallbladder wall thickening, or biliary dilatation. Pancreas: Unremarkable. No pancreatic ductal dilatation or surrounding inflammatory changes. Spleen: Normal in size without focal abnormality. Adrenals/Urinary Tract: Adrenal glands appear normal. Bilateral nonobstructive nephrolithiasis is noted. No hydronephrosis or renal obstruction is noted. Urinary bladder is unremarkable. Stomach/Bowel: Stomach is within normal limits. Appendix appears normal. No evidence of bowel wall thickening, distention, or inflammatory changes. Vascular/Lymphatic: Aortic atherosclerosis. No enlarged abdominal or pelvic lymph nodes. Reproductive: Prostate is unremarkable. Other: No abdominal wall hernia or abnormality. No abdominopelvic ascites. Musculoskeletal: Mildly displaced fractures are seen involving the right inferior and superior pubic rami. Status post old surgical internal fixation of proximal right femur. IMPRESSION: Mildly displaced fractures involving the right inferior and superior pubic rami. Bilateral nonobstructive nephrolithiasis. Aortic atherosclerosis. Coronary artery calcifications are noted suggesting coronary artery disease. Bronchial thickening and airspace opacity is noted posteriorly in superior segment of right lower lobe concerning for focal inflammation or possibly pneumonia. Old proximal right humeral neck fracture. Electronically Signed   By: Marijo Conception, M.D.   On: 04/26/2017 15:56        Scheduled Meds: . aspirin EC  81 mg Oral Daily  . gabapentin  300 mg Oral TID  . insulin aspart  0-5 Units Subcutaneous QHS  . insulin aspart  0-9 Units Subcutaneous TID WC  . insulin detemir  26 Units Subcutaneous QHS  . mouth rinse  15 mL Mouth Rinse BID  . pantoprazole  40 mg Oral Daily   Continuous Infusions: . sodium chloride 100 mL/hr at 04/27/17 1753  . azithromycin    . cefTRIAXone (ROCEPHIN)  IV 1  g (04/27/17 1757)  . heparin 1,300 Units/hr (04/27/17 1138)     LOS: 1 day    Time spent: 30 minutes. Greater than 50% of this time was spent in direct contact with the patient coordinating care.     Lelon Frohlich, MD [facility] Pager [phone]  If 7PM-7AM, please contact night-coverage [email or web address] Password [facility] 04/27/2017, 6:40 PM

## 2017-04-27 NOTE — Care Management Note (Signed)
Case Management Note  Patient Details  Name: Daniel Campos MRN: [id number] Date of Birth: 04-08-1956  Subjective/Objective:  Adm with ARF, PE. Recent fall resulting in pubic rami fracture. From home with daughter, she works during the day. Has RW at home. He goes to [facility] for [facility] care. No insurance listed but reports he has insurance, daughter in law in bringing card today per patient. We discuss home heatlh, he says "lets what and see".                    Action/Plan: CM following for needs. Anticipate  PT eval closer to time of DC.   Expected Discharge Date:    04/30/2017              Expected Discharge Plan:  [place]  In-House Referral:     Discharge planning Services  CM Consult  Post Acute Care Choice:    Choice offered to:     DME Arranged:    DME Agency:     HH Arranged:    HH Agency:     Status of Service:  In process, will continue to follow  If discussed at Long Length of Stay Meetings, dates discussed:    Additional Comments:  Trang Bouse, Chauncey Reading, RN 04/27/2017, 11:55 AM

## 2017-04-27 NOTE — Progress Notes (Signed)
Inpatient Diabetes Program Recommendations  AACE/ADA: New Consensus Statement on Inpatient Glycemic Control (2015)  Target Ranges:  Prepandial:   less than 140 mg/dL      Peak postprandial:   less than 180 mg/dL (1-2 hours)      Critically ill patients:  140 - 180 mg/dL  Results for Daniel Campos, Daniel Campos (MRN [id number]) as of 04/27/2017 07:37  Ref. Range 04/26/2017 14:02 04/27/2017 05:33  Glucose Latest Ref Range: 65 - 99 mg/dL 166 (H) 284 (H)   Results for Daniel Campos, Daniel Campos (MRN [id number]) as of 04/27/2017 07:37  Ref. Range 04/26/2017 22:20  Glucose-Capillary Latest Ref Range: 65 - 99 mg/dL 267 (H)    Review of Glycemic Control  Diabetes history: DM2 Outpatient Diabetes medications: Levemir 26 units QHS, Novolog 5 units TID with meals Current orders for Inpatient glycemic control: Levemir 26 units QHS, Novolog 0-9 units TID with meals, Novolog 0-5 units QHS  Inpatient Diabetes Program Recommendations: Insulin - Basal: Please consider increasing Levemir to 29 units QHS. Insulin - Meal Coverage: Please consider ordering Novolog 3 units TID with meals for meal coverage if patient eats at least 50% of meals. A1C: Please consider ordering an A1C to evaluate glycemic control over the past 2-3 months.  Thanks, Barnie Alderman, RN, MSN, CDE Diabetes Coordinator Inpatient Diabetes Program [phone] (Team Pager from 8am to 5pm)

## 2017-04-27 NOTE — Progress Notes (Signed)
ANTICOAGULATION CONSULT NOTE - follow up  Pharmacy Consult for HEPARIN Indication: pulmonary embolus  No Known Allergies  Patient Measurements: Height: 5\' 11"  (180.3 cm) Weight: 176 lb 2.4 oz (79.9 kg) IBW/kg (Calculated) : 75.3  Vital Signs: Temp: 97.8 F (36.6 C) (12/12 0704) Temp Source: Oral (12/12 0704) BP: 142/68 (12/12 0704) Pulse Rate: 101 (12/12 0704)  Labs: Recent Labs    04/26/17 1402  04/26/17 1827 04/26/17 2354 04/27/17 0533  HGB 8.7*  --   --   --  8.6*  HCT 26.8*  --   --   --  26.3*  PLT 234  --   --   --  232  HEPARINUNFRC  --   --   --   --  0.62  CREATININE 2.69*  --   --   --  2.09*  TROPONINI  --    < > 0.04* 0.08* 0.11*   < > = values in this interval not displayed.    Estimated Creatinine Clearance: 39 mL/min (A) (by C-G formula based on SCr of 2.09 mg/dL (H)).   Medical History: Past Medical History:  Diagnosis Date  . Diabetes mellitus without complication ([place])   . Hypertension   . Prostate hypertrophy     Medications:  Medications Prior to Admission  Medication Sig Dispense Refill Last Dose  . aspirin 81 MG tablet Take 81 mg by mouth daily.   04/26/2017 at 1000  . B Complex-C (B-COMPLEX WITH VITAMIN C) tablet Take 1 tablet by mouth daily.   04/26/2017 at 1000  . capsaicin (ZOSTRIX) 0.025 % cream Apply 1 application topically 3 (three) times daily.   04/25/2017 at Unknown time  . Cholecalciferol (VITAMIN D PO) Take 1 tablet by mouth daily.   04/25/2017 at Unknown time  . gabapentin (NEURONTIN) 300 MG capsule Take 1 capsule by mouth 3 (three) times daily.   04/26/2017 at 100  . insulin detemir (LEVEMIR) 100 UNIT/ML injection Inject 26 Units into the skin at bedtime.    04/25/2017 at 2300  . Melatonin 10 MG TABS Take 10 mg by mouth at bedtime.   04/25/2017 at 2300  . meloxicam (MOBIC) 15 MG tablet Take 15 mg by mouth daily.   04/26/2017 at 1000  . NOVOLOG FLEXPEN 100 UNIT/ML FlexPen Inject 5 Units into the skin 3 (three) times daily  with meals.    Past Week at Unknown time  . omeprazole (PRILOSEC) 40 MG capsule Take 40 mg by mouth every morning.   04/26/2017 at 1000  . ondansetron (ZOFRAN) 4 MG tablet Take 1 tablet (4 mg total) by mouth every 8 (eight) hours as needed for nausea or vomiting. 20 tablet 0 04/25/2017 at Unknown time  . oxyCODONE-acetaminophen (PERCOCET/ROXICET) 5-325 MG tablet Take 1-2 tablets by mouth every 6 (six) hours as needed for severe pain. 20 tablet 0 04/26/2017 at 1000  . pravastatin (PRAVACHOL) 10 MG tablet Take 5 mg by mouth daily.   Not Taking at Unknown time  . ranitidine (ZANTAC) 150 MG tablet Take 300 mg by mouth at bedtime.   Not Taking at Unknown time    Assessment: 61yo male with suspected PE.  Asked to initiate Heparin.  Heparin level therapeutic.  No bleeding reported, CBC relatively stable.   Goal of Therapy:  Heparin level 0.3-0.7 units/ml Monitor platelets by anticoagulation protocol: Yes   Plan:   Continue Heparin infusion at 1300 units/hr  Heparin level daily  CBC daily while on Heparin  [place] Crane, Norlene Lanes A 04/27/2017,10:27 AM

## 2017-04-27 NOTE — Progress Notes (Signed)
CRITICAL VALUE ALERT  Critical Value:  Troponin 0.11  Date & Time Notied:  12/12 0622  Provider Notified: Olevia Bowens  Orders Received/Actions taken: No new orders

## 2017-04-27 NOTE — Progress Notes (Signed)
CRITICAL VALUE ALERT  Critical Value:  Troponin 0.08  Date & Time Notied: 04/27/17 0115  Provider Notified: Olevia Bowens  Orders Received/Actions taken: No new orders

## 2017-04-28 ENCOUNTER — Inpatient Hospital Stay (HOSPITAL_COMMUNITY): Payer: Medicare Other

## 2017-04-28 DIAGNOSIS — R7881 Bacteremia: Secondary | ICD-10-CM

## 2017-04-28 DIAGNOSIS — I2699 Other pulmonary embolism without acute cor pulmonale: Secondary | ICD-10-CM

## 2017-04-28 LAB — CBC
HEMATOCRIT: 23.7 % — AB (ref 39.0–52.0)
HEMOGLOBIN: 7.7 g/dL — AB (ref 13.0–17.0)
MCH: 28.2 pg (ref 26.0–34.0)
MCHC: 32.5 g/dL (ref 30.0–36.0)
MCV: 86.8 fL (ref 78.0–100.0)
Platelets: 258 10*3/uL (ref 150–400)
RBC: 2.73 MIL/uL — AB (ref 4.22–5.81)
RDW: 13.3 % (ref 11.5–15.5)
WBC: 16.7 10*3/uL — AB (ref 4.0–10.5)

## 2017-04-28 LAB — LEGIONELLA PNEUMOPHILA SEROGP 1 UR AG: L. PNEUMOPHILA SEROGP 1 UR AG: NEGATIVE

## 2017-04-28 LAB — BASIC METABOLIC PANEL
ANION GAP: 8 (ref 5–15)
BUN: 54 mg/dL — AB (ref 6–20)
CALCIUM: 8 mg/dL — AB (ref 8.9–10.3)
CO2: 21 mmol/L — ABNORMAL LOW (ref 22–32)
Chloride: 106 mmol/L (ref 101–111)
Creatinine, Ser: 1.29 mg/dL — ABNORMAL HIGH (ref 0.61–1.24)
GFR calc Af Amer: >60 mL/min (ref >60)
GFR, EST NON AFRICAN AMERICAN: 58 mL/min — AB (ref >60)
GLUCOSE: 38 mg/dL — AB (ref 65–99)
POTASSIUM: 3.9 mmol/L (ref 3.5–5.1)
SODIUM: 135 mmol/L (ref 135–145)

## 2017-04-28 LAB — ECHOCARDIOGRAM COMPLETE
HEIGHTINCHES: 71 in
Weight: 2818.36 oz

## 2017-04-28 LAB — GLUCOSE, CAPILLARY
GLUCOSE-CAPILLARY: 25 mg/dL — AB (ref 65–99)
GLUCOSE-CAPILLARY: 91 mg/dL (ref 65–99)
Glucose-Capillary: 103 mg/dL — ABNORMAL HIGH (ref 65–99)
Glucose-Capillary: 269 mg/dL — ABNORMAL HIGH (ref 65–99)
Glucose-Capillary: 146 mg/dL — ABNORMAL HIGH (ref 65–99)

## 2017-04-28 LAB — CALCIUM / CREATININE RATIO, URINE
CALCIUM UR: 2.1 mg/dL
Calcium/Creat.Ratio: 16 mg/g creat (ref 0–260)
Creatinine, Urine: 135.2 mg/dL

## 2017-04-28 LAB — HIV ANTIBODY (ROUTINE TESTING W REFLEX): HIV SCREEN 4TH GENERATION: NONREACTIVE

## 2017-04-28 LAB — HEPARIN LEVEL (UNFRACTIONATED)

## 2017-04-28 MED ORDER — HEPARIN BOLUS VIA INFUSION
2000.0000 [IU] | Freq: Once | INTRAVENOUS | Status: AC
Start: 2017-04-28 — End: 2017-04-28
  Administered 2017-04-28: 2000 [IU] via INTRAVENOUS
  Filled 2017-04-28: qty 2000

## 2017-04-28 MED ORDER — AMOXICILLIN-POT CLAVULANATE 875-125 MG PO TABS
1.0000 | ORAL_TABLET | Freq: Two times a day (BID) | ORAL | Status: DC
  Administered 2017-04-28: 1 via ORAL
  Filled 2017-04-28: qty 1

## 2017-04-28 MED ORDER — TECHNETIUM TC 99M DIETHYLENETRIAME-PENTAACETIC ACID
30.0000 | Freq: Once | INTRAVENOUS | Status: AC | PRN
  Administered 2017-04-28: 30 via RESPIRATORY_TRACT

## 2017-04-28 MED ORDER — APIXABAN 5 MG PO TABS
10.0000 mg | ORAL_TABLET | Freq: Two times a day (BID) | ORAL | Status: DC
  Administered 2017-04-28 – 2017-04-29 (×3): 10 mg via ORAL
  Filled 2017-04-28 (×3): qty 2

## 2017-04-28 MED ORDER — TECHNETIUM TO 99M ALBUMIN AGGREGATED
4.0000 | Freq: Once | INTRAVENOUS | Status: AC | PRN
  Administered 2017-04-28: 4 via INTRAVENOUS

## 2017-04-28 MED ORDER — DEXTROSE 50 % IV SOLN
25.0000 mL | Freq: Once | INTRAVENOUS | Status: AC
  Administered 2017-04-28: 25 mL via INTRAVENOUS

## 2017-04-28 MED ORDER — APIXABAN 5 MG PO TABS: 5.0000 mg | ORAL_TABLET | Freq: Two times a day (BID) | ORAL | Status: DC

## 2017-04-28 NOTE — Progress Notes (Signed)
ANTICOAGULATION CONSULT NOTE - follow up  Pharmacy Consult for HEPARIN >> ELIQUIS Indication: pulmonary embolus  No Known Allergies  Patient Measurements: Height: 5\' 11"  (180.3 cm) Weight: 176 lb 2.4 oz (79.9 kg) IBW/kg (Calculated) : 75.3  Vital Signs: Temp: 98.4 F (36.9 C) (12/13 1322) Temp Source: Oral (12/13 0604) BP: 143/87 (12/13 1322) Pulse Rate: 108 (12/13 1322)  Labs: Recent Labs    04/26/17 1402  04/26/17 1827 04/26/17 2354 04/27/17 0533 04/28/17 0412  HGB 8.7*  --   --   --  8.6* 7.7*  HCT 26.8*  --  25.4*  --  26.3* 23.7*  PLT 234  --   --   --  232 258  HEPARINUNFRC  --   --   --   --  0.62 <0.10*  CREATININE 2.69*  --   --   --  2.09* 1.29*  TROPONINI  --    < > 0.04* 0.08* 0.11*  --    < > = values in this interval not displayed.   Estimated Creatinine Clearance: 63.2 mL/min (A) (by C-G formula based on SCr of 1.29 mg/dL (H)).  Medical History: Past Medical History:  Diagnosis Date  . Diabetes mellitus without complication ([place])   . Hypertension   . Prostate hypertrophy    Medications:  Medications Prior to Admission  Medication Sig Dispense Refill Last Dose  . aspirin 81 MG tablet Take 81 mg by mouth daily.   04/26/2017 at 1000  . B Complex-C (B-COMPLEX WITH VITAMIN C) tablet Take 1 tablet by mouth daily.   04/26/2017 at 1000  . capsaicin (ZOSTRIX) 0.025 % cream Apply 1 application topically 3 (three) times daily.   04/25/2017 at Unknown time  . Cholecalciferol (VITAMIN D PO) Take 1 tablet by mouth daily.   04/25/2017 at Unknown time  . gabapentin (NEURONTIN) 300 MG capsule Take 1 capsule by mouth 3 (three) times daily.   04/26/2017 at 100  . insulin detemir (LEVEMIR) 100 UNIT/ML injection Inject 26 Units into the skin at bedtime.    04/25/2017 at 2300  . Melatonin 10 MG TABS Take 10 mg by mouth at bedtime.   04/25/2017 at 2300  . meloxicam (MOBIC) 15 MG tablet Take 15 mg by mouth daily.   04/26/2017 at 1000  . NOVOLOG FLEXPEN 100 UNIT/ML  FlexPen Inject 5 Units into the skin 3 (three) times daily with meals.    Past Week at Unknown time  . omeprazole (PRILOSEC) 40 MG capsule Take 40 mg by mouth every morning.   04/26/2017 at 1000  . ondansetron (ZOFRAN) 4 MG tablet Take 1 tablet (4 mg total) by mouth every 8 (eight) hours as needed for nausea or vomiting. 20 tablet 0 04/25/2017 at Unknown time  . oxyCODONE-acetaminophen (PERCOCET/ROXICET) 5-325 MG tablet Take 1-2 tablets by mouth every 6 (six) hours as needed for severe pain. 20 tablet 0 04/26/2017 at 1000  . pravastatin (PRAVACHOL) 10 MG tablet Take 5 mg by mouth daily.   Not Taking at Unknown time  . ranitidine (ZANTAC) 150 MG tablet Take 300 mg by mouth at bedtime.   Not Taking at Unknown time   Assessment: 61yo male with suspected PE.  Heparin being switched to PO ELIQUIS.      Goal of Therapy:  Monitor platelets by anticoagulation protocol: Yes   Plan:   Eliquis 10mg  po bid x 7 days then 5mg  po bid  Provide written education / educate patient  Hart Robinsons A 04/28/2017,2:30 PM

## 2017-04-28 NOTE — Care Management (Signed)
Pt's PCP Dr. Kellie Shropshire at [facility] clinic. Pt will DC on Eliquis. Pt eligible for 30-day voucher. CM has contact SW at [facility] [place] [phone]) to inquire about getting Eliquis Rx to [place] to get it filled and to see about getting [place] services at discharge, JUST IN CASE patient needs them. Per RN pt not getting up, says he can't, he doesn't feel well. CM cont to follow.

## 2017-04-28 NOTE — Progress Notes (Signed)
*  PRELIMINARY RESULTS* Echocardiogram 2D Echocardiogram has been performed.  Daniel Campos 04/28/2017, 4:06 PM

## 2017-04-28 NOTE — Progress Notes (Signed)
CRITICAL VALUE ALERT  Critical Value:  Glucose 38  Date & Time Notied:  12/13 @ 0720  Provider Notified: Jerilee Hoh, MD.  Orders Received/Actions taken: Awaiting response, pt is in no distress. Reported critical value to [place] Deck, RN.

## 2017-04-28 NOTE — Progress Notes (Signed)
PROGRESS NOTE    Daniel Campos  SWN:[id number] DOB: 1956-03-25 DOA: 04/26/2017 PCP: [name]     Brief Narrative:  61 year old man admitted from home on 12/11.  He fell at home about 5 days prior came to the ED and was diagnosed with a pelvic fracture.  Was sent home on oxycodone.  Over the weekend his family noted that he had been very sedentary due to pain with decreased appetite and increased weakness as well as decreased urine output so they brought him back to the hospital on day of admission.  Admission was requested.   Assessment & Plan:   Principal Problem:   ARF (acute renal failure) (HCC) Active Problems:   Diabetes ([place])   Essential hypertension, benign   Tachycardia   Anemia   Hyponatremia   Acute urinary retention   Acute renal failure -Suspect mainly due to acute urinary retention with also possibly a prerenal component due to decreased oral intake with pain from pelvic fracture. -Acute urinary retention likely due to narcotic usage. -We will plan to keep Foley in upon discharge as scheduled outpatient follow-up with urology for voiding trial.  Acute pulmonary embolism/acute hypoxemic respiratory failure -VQ scan with high probability for PE. -We will convert IV heparin over to Eliquis after discussion with patient. -Will need to be treated with anticoagulation for at least 9 months. -We will completely discontinue antibiotics as I do not believe he has pneumonia. -Continue to wean oxygen as tolerated.  Elevated troponin -Likely due to demand ischemia in face of acute PE. -2D echo without signs of right heart strain, EF of 60-65% with grade 1 diastolic dysfunction and no wall motion needs. -No further cardiac workup.  Type 2 diabetes -With significant hypoglycemia this a.m. -Patient admits to not eating much for dinner or breakfast this morning.  Throughout the day his CBGs have increased to the range of 103-269.  We will not change  insulin regimen for now.   DVT prophylaxis: Eliquis Code Status: Full code Family Communication: Patient only Disposition Plan: Anticipate discharge home in 24-48 hours  Consultants:   None  Procedures:   None  Antimicrobials:  Anti-infectives (From admission, onward)   Start     Dose/Rate Route Frequency Ordered Stop   04/28/17 1030  amoxicillin-clavulanate (AUGMENTIN) 875-125 MG per tablet 1 tablet  Status:  Discontinued     1 tablet Oral Every 12 hours 04/28/17 1025 04/28/17 1405   04/27/17 2000  azithromycin (ZITHROMAX) 500 mg in dextrose 5 % 250 mL IVPB  Status:  Discontinued     500 mg 250 mL/hr over 60 Minutes Intravenous Every 24 hours 04/26/17 2034 04/28/17 1025   04/27/17 1800  cefTRIAXone (ROCEPHIN) 1 g in dextrose 5 % 50 mL IVPB  Status:  Discontinued     1 g 100 mL/hr over 30 Minutes Intravenous Every 24 hours 04/26/17 2034 04/28/17 1025   04/26/17 1815  cefTRIAXone (ROCEPHIN) 1 g in dextrose 5 % 50 mL IVPB     1 g 100 mL/hr over 30 Minutes Intravenous  Once 04/26/17 1808 04/26/17 1928   04/26/17 1815  azithromycin (ZITHROMAX) 500 mg in dextrose 5 % 250 mL IVPB     500 mg 250 mL/hr over 60 Minutes Intravenous  Once 04/26/17 1808 04/26/17 2023       Subjective: Feels much improved, abdominal pain completely resolved after placement of Foley catheter.  Still with some cough and mild shortness of breath.  Objective: Vitals:   04/27/17 1500  04/27/17 2104 04/28/17 0604 04/28/17 1322  BP: 139/70 137/77 130/71 (!) 143/87  Pulse: (!) 106 (!) 110 (!) 113 (!) 108  Resp: 18 20 18 18   Temp: 97.8 F (36.6 C) 98.6 F (37 C) 98.9 F (37.2 C) 98.4 F (36.9 C)  TempSrc: Oral Oral Oral   SpO2: 95% 98% 96% 96%  Weight:      Height:        Intake/Output Summary (Last 24 hours) at 04/28/2017 1654 Last data filed at 04/28/2017 1500 Gross per 24 hour  Intake 2395 ml  Output 6000 ml  Net -3605 ml   Filed Weights   04/26/17 2052  Weight: 79.9 kg (176 lb 2.4 oz)      Examination:  General exam: Alert, awake, oriented x 3 Respiratory system: Clear to auscultation. Respiratory effort normal. Cardiovascular system:RRR. No murmurs, rubs, gallops. Gastrointestinal system: Abdomen is nondistended, soft and nontender. No organomegaly or masses felt. Normal bowel sounds heard. Central nervous system: Alert and oriented. No focal neurological deficits. Extremities: No C/C/E, +pedal pulses Skin: No rashes, lesions or ulcers Psychiatry: Judgement and insight appear normal. Mood & affect appropriate.     Data Reviewed: I have personally reviewed following labs and imaging studies  CBC: Recent Labs  Lab 04/22/17 1217 04/26/17 1402 04/26/17 1827 04/27/17 0533 04/28/17 0412  WBC 11.0* 16.0*  --  17.9* 16.7*  NEUTROABS 8.3* 14.0*  --   --   --   HGB 10.3* 8.7*  --  8.6* 7.7*  HCT 31.7* 26.8* 25.4* 26.3* 23.7*  MCV 87.3 88.7  --  89.2 86.8  PLT 263 234  --  232 878   Basic Metabolic Panel: Recent Labs  Lab 04/22/17 1217 04/26/17 1402 04/27/17 0533 04/28/17 0412  NA 137 131* 132* 135  K 5.0 5.1 5.0 3.9  CL 106 98* 101 106  CO2 27 19* 19* 21*  GLUCOSE 157* 166* 284* 38*  BUN 21* 93* 87* 54*  CREATININE 0.84 2.69* 2.09* 1.29*  CALCIUM 9.1 7.9* 7.9* 8.0*   GFR: Estimated Creatinine Clearance: 63.2 mL/min (A) (by C-G formula based on SCr of 1.29 mg/dL (H)). Liver Function Tests: Recent Labs  Lab 04/22/17 1217 04/26/17 1402 04/27/17 0533  AST 18 16 16   ALT 20 15* 15*  ALKPHOS 123 90 86  BILITOT 0.4 1.0 1.1  PROT 7.0 7.1 7.0  ALBUMIN 3.8 3.2* 3.0*   Recent Labs  Lab 04/26/17 1402  LIPASE 16   No results for input(s): AMMONIA in the last 168 hours. Coagulation Profile: No results for input(s): INR, PROTIME in the last 168 hours. Cardiac Enzymes: Recent Labs  Lab 04/26/17 1506 04/26/17 1827 04/26/17 2354 04/27/17 0533  TROPONINI <0.03 0.04* 0.08* 0.11*   BNP (last 3 results) No results for input(s): PROBNP in the last  8760 hours. HbA1C: No results for input(s): HGBA1C in the last 72 hours. CBG: Recent Labs  Lab 04/27/17 2106 04/28/17 0740 04/28/17 0802 04/28/17 1200 04/28/17 1645  GLUCAP 153* 25* 91 103* 269*   Lipid Profile: Recent Labs    04/27/17 0533  CHOL 140  HDL 57  LDLCALC 69  TRIG 69  CHOLHDL 2.5   Thyroid Function Tests: Recent Labs    04/26/17 1827  TSH 1.440   Anemia Panel: Recent Labs    04/26/17 1830  VITAMINB12 236  FERRITIN 154  TIBC 214*  IRON 11*   Urine analysis:    Component Value Date/Time   COLORURINE YELLOW 04/26/2017 [place]  04/26/2017 1352   LABSPEC 1.015 04/26/2017 1352   PHURINE 5.0 04/26/2017 1352   GLUCOSEU 150 (A) 04/26/2017 1352   HGBUR SMALL (A) 04/26/2017 1352   BILIRUBINUR NEGATIVE 04/26/2017 1352   KETONESUR NEGATIVE 04/26/2017 1352   PROTEINUR NEGATIVE 04/26/2017 1352   UROBILINOGEN 0.2 11/16/2010 1144   NITRITE NEGATIVE 04/26/2017 1352   LEUKOCYTESUR NEGATIVE 04/26/2017 1352   Sepsis Labs: @LABRCNTIP (procalcitonin:4,lacticidven:4)  ) Recent Results (from the past 240 hour(s))  Blood culture (routine x 2)     Status: None (Preliminary result)   Collection Time: 04/26/17  6:27 PM  Result Value Ref Range Status   Specimen Description RIGHT ANTECUBITAL  Final   Special Requests   Final    BOTTLES DRAWN AEROBIC AND ANAEROBIC Blood Culture results may not be optimal due to an inadequate volume of blood received in culture bottles   Culture NO GROWTH 2 DAYS  Final   Report Status PENDING  Incomplete  Blood culture (routine x 2)     Status: None (Preliminary result)   Collection Time: 04/26/17  6:27 PM  Result Value Ref Range Status   Specimen Description BLOOD RIGHT HAND  Final   Special Requests   Final    BOTTLES DRAWN AEROBIC AND ANAEROBIC Blood Culture results may not be optimal due to an inadequate volume of blood received in culture bottles   Culture NO GROWTH 2 DAYS  Final   Report Status PENDING   Incomplete         Radiology Studies: Nm Pulmonary Perf And Vent  Result Date: 04/28/2017 CLINICAL DATA:  Generalized pain.  Prior history of fall . EXAM: NUCLEAR MEDICINE VENTILATION - PERFUSION LUNG SCAN TECHNIQUE: Ventilation images were obtained in multiple projections using inhaled aerosol Tc-47m DTPA. Perfusion images were obtained in multiple projections after intravenous injection of Tc-15m MAA. RADIOPHARMACEUTICALS:  33.0 MCi Technetium-2m DTPA aerosol inhalation and 4.9 mCi Technetium-63m MAA IV COMPARISON:  None. FINDINGS: Large right mid lung field perfusion defects larger than ventilation defects or chest CT abnormalities. Small left upper and lower mismatch perfusion defects. Findings suggest high probability pulmonary embolus. IMPRESSION: High probability pulmonary embolus. Electronically Signed   By: Braddock   On: 04/28/2017 12:10        Scheduled Meds: . apixaban  10 mg Oral BID   Followed by  . [START ON 05/05/2017] apixaban  5 mg Oral BID  . aspirin EC  81 mg Oral Daily  . gabapentin  300 mg Oral TID  . insulin aspart  0-5 Units Subcutaneous QHS  . insulin aspart  0-9 Units Subcutaneous TID WC  . insulin detemir  26 Units Subcutaneous QHS  . mouth rinse  15 mL Mouth Rinse BID  . pantoprazole  40 mg Oral Daily   Continuous Infusions:   LOS: 2 days    Time spent: 30 minutes. Greater than 50% of this time was spent in direct contact with the patient coordinating care.     Lelon Frohlich, MD [facility] Pager [phone]  If 7PM-7AM, please contact night-coverage [email or web address] Password [facility] 04/28/2017, 4:54 PM

## 2017-04-28 NOTE — Progress Notes (Signed)
ANTICOAGULATION CONSULT NOTE - follow up  Pharmacy Consult for HEPARIN Indication: pulmonary embolus  No Known Allergies  Patient Measurements: Height: 5\' 11"  (180.3 cm) Weight: 176 lb 2.4 oz (79.9 kg) IBW/kg (Calculated) : 75.3  Vital Signs: Temp: 98.9 F (37.2 C) (12/13 0604) Temp Source: Oral (12/13 0604) BP: 130/71 (12/13 0604) Pulse Rate: 113 (12/13 0604)  Labs: Recent Labs    04/26/17 1402  04/26/17 1827 04/26/17 2354 04/27/17 0533 04/28/17 0412  HGB 8.7*  --   --   --  8.6* 7.7*  HCT 26.8*  --  25.4*  --  26.3* 23.7*  PLT 234  --   --   --  232 258  HEPARINUNFRC  --   --   --   --  0.62 <0.10*  CREATININE 2.69*  --   --   --  2.09* 1.29*  TROPONINI  --    < > 0.04* 0.08* 0.11*  --    < > = values in this interval not displayed.    Estimated Creatinine Clearance: 63.2 mL/min (A) (by C-G formula based on SCr of 1.29 mg/dL (H)).   Medical History: Past Medical History:  Diagnosis Date  . Diabetes mellitus without complication ([place])   . Hypertension   . Prostate hypertrophy     Medications:  Medications Prior to Admission  Medication Sig Dispense Refill Last Dose  . aspirin 81 MG tablet Take 81 mg by mouth daily.   04/26/2017 at 1000  . B Complex-C (B-COMPLEX WITH VITAMIN C) tablet Take 1 tablet by mouth daily.   04/26/2017 at 1000  . capsaicin (ZOSTRIX) 0.025 % cream Apply 1 application topically 3 (three) times daily.   04/25/2017 at Unknown time  . Cholecalciferol (VITAMIN D PO) Take 1 tablet by mouth daily.   04/25/2017 at Unknown time  . gabapentin (NEURONTIN) 300 MG capsule Take 1 capsule by mouth 3 (three) times daily.   04/26/2017 at 100  . insulin detemir (LEVEMIR) 100 UNIT/ML injection Inject 26 Units into the skin at bedtime.    04/25/2017 at 2300  . Melatonin 10 MG TABS Take 10 mg by mouth at bedtime.   04/25/2017 at 2300  . meloxicam (MOBIC) 15 MG tablet Take 15 mg by mouth daily.   04/26/2017 at 1000  . NOVOLOG FLEXPEN 100 UNIT/ML FlexPen  Inject 5 Units into the skin 3 (three) times daily with meals.    Past Week at Unknown time  . omeprazole (PRILOSEC) 40 MG capsule Take 40 mg by mouth every morning.   04/26/2017 at 1000  . ondansetron (ZOFRAN) 4 MG tablet Take 1 tablet (4 mg total) by mouth every 8 (eight) hours as needed for nausea or vomiting. 20 tablet 0 04/25/2017 at Unknown time  . oxyCODONE-acetaminophen (PERCOCET/ROXICET) 5-325 MG tablet Take 1-2 tablets by mouth every 6 (six) hours as needed for severe pain. 20 tablet 0 04/26/2017 at 1000  . pravastatin (PRAVACHOL) 10 MG tablet Take 5 mg by mouth daily.   Not Taking at Unknown time  . ranitidine (ZANTAC) 150 MG tablet Take 300 mg by mouth at bedtime.   Not Taking at Unknown time   Assessment: 61yo male with suspected PE.  Asked to initiate Heparin.  Heparin level has dropped today to SUBtherapeutic level.  Nurse does not report any problems or interruptions with infusion.  No bleeding reported, H/H slightly worse today.     Goal of Therapy:  Heparin level 0.3-0.7 units/ml Monitor platelets by anticoagulation protocol: Yes  Plan:   Heparin 2000 units rebolus today x 1 then increase Heparin infusion to 1400 units/hr  Heparin level in 6-8 hrs to recheck then daily  CBC daily while on Heparin  [place] Crane, Makana Rostad A 04/28/2017,8:00 AM

## 2017-04-28 NOTE — Progress Notes (Signed)
Pt CBG 25. Dextrose 50 % solution 25 mL administered to patient at 0750. CBG rechecked at 0802 and is now  91. Insulin coverage has been held for patient at this time. Pt stated he was feeling hungry and a "little crappy". Breakfast offered to patient as well as peanut butter and crackers. Pt now states that he is feeling better. Will continue to monitor patient. MD informed.

## 2017-04-29 DIAGNOSIS — R338 Other retention of urine: Secondary | ICD-10-CM

## 2017-04-29 DIAGNOSIS — Z86711 Personal history of pulmonary embolism: Secondary | ICD-10-CM

## 2017-04-29 DIAGNOSIS — I2699 Other pulmonary embolism without acute cor pulmonale: Secondary | ICD-10-CM

## 2017-04-29 DIAGNOSIS — N179 Acute kidney failure, unspecified: Principal | ICD-10-CM

## 2017-04-29 DIAGNOSIS — R7881 Bacteremia: Secondary | ICD-10-CM

## 2017-04-29 LAB — GLUCOSE, CAPILLARY
GLUCOSE-CAPILLARY: 131 mg/dL — AB (ref 65–99)
GLUCOSE-CAPILLARY: 51 mg/dL — AB (ref 65–99)
Glucose-Capillary: 176 mg/dL — ABNORMAL HIGH (ref 65–99)
Glucose-Capillary: 93 mg/dL (ref 65–99)
Glucose-Capillary: 73 mg/dL (ref 65–99)

## 2017-04-29 LAB — BLOOD CULTURE ID PANEL (REFLEXED)
Acinetobacter baumannii: NOT DETECTED
CANDIDA KRUSEI: NOT DETECTED
CANDIDA PARAPSILOSIS: NOT DETECTED
Candida albicans: NOT DETECTED
Candida glabrata: NOT DETECTED
Candida tropicalis: NOT DETECTED
ESCHERICHIA COLI: NOT DETECTED
Enterobacter cloacae complex: NOT DETECTED
Enterobacteriaceae species: NOT DETECTED
Enterococcus species: NOT DETECTED
Haemophilus influenzae: NOT DETECTED
KLEBSIELLA OXYTOCA: NOT DETECTED
Klebsiella pneumoniae: NOT DETECTED
Listeria monocytogenes: NOT DETECTED
Methicillin resistance: NOT DETECTED
Neisseria meningitidis: NOT DETECTED
PROTEUS SPECIES: NOT DETECTED
Pseudomonas aeruginosa: NOT DETECTED
SERRATIA MARCESCENS: NOT DETECTED
STAPHYLOCOCCUS AUREUS BCID: NOT DETECTED
STAPHYLOCOCCUS SPECIES: DETECTED — AB
STREPTOCOCCUS PNEUMONIAE: NOT DETECTED
Streptococcus agalactiae: NOT DETECTED
Streptococcus pyogenes: NOT DETECTED
Streptococcus species: NOT DETECTED

## 2017-04-29 LAB — BASIC METABOLIC PANEL
Anion gap: 6 (ref 5–15)
BUN: 26 mg/dL — ABNORMAL HIGH (ref 6–20)
CALCIUM: 8.1 mg/dL — AB (ref 8.9–10.3)
CO2: 26 mmol/L (ref 22–32)
CREATININE: 0.92 mg/dL (ref 0.61–1.24)
Chloride: 104 mmol/L (ref 101–111)
GFR calc non Af Amer: >60 mL/min (ref >60)
GLUCOSE: 96 mg/dL (ref 65–99)
Potassium: 4 mmol/L (ref 3.5–5.1)
Sodium: 136 mmol/L (ref 135–145)

## 2017-04-29 LAB — CBC
HEMATOCRIT: 24.7 % — AB (ref 39.0–52.0)
Hemoglobin: 7.8 g/dL — ABNORMAL LOW (ref 13.0–17.0)
MCH: 27.6 pg (ref 26.0–34.0)
MCHC: 31.6 g/dL (ref 30.0–36.0)
MCV: 87.3 fL (ref 78.0–100.0)
Platelets: 290 10*3/uL (ref 150–400)
RBC: 2.83 MIL/uL — ABNORMAL LOW (ref 4.22–5.81)
RDW: 12.5 % (ref 11.5–15.5)
WBC: 14.7 10*3/uL — ABNORMAL HIGH (ref 4.0–10.5)

## 2017-04-29 MED ORDER — APIXABAN 5 MG PO TABS: 5.0000 mg | ORAL_TABLET | Freq: Two times a day (BID) | ORAL | 2 refills | Status: DC

## 2017-04-29 MED ORDER — VANCOMYCIN HCL 10 G IV SOLR
1250.0000 mg | Freq: Once | INTRAVENOUS | Status: AC
  Administered 2017-04-29: 1250 mg via INTRAVENOUS
  Filled 2017-04-29: qty 1250

## 2017-04-29 MED ORDER — DEXTROSE 50 % IV SOLN
INTRAVENOUS | Status: AC
  Administered 2017-04-29: 50 mL
  Filled 2017-04-29: qty 50

## 2017-04-29 MED ORDER — OXYCODONE-ACETAMINOPHEN 5-325 MG PO TABS: 1.0000 | ORAL_TABLET | Freq: Four times a day (QID) | ORAL | 0 refills | Status: DC | PRN

## 2017-04-29 MED ORDER — ELIQUIS 5 MG VTE STARTER PACK: 10.0000 mg | ORAL_TABLET | Freq: Two times a day (BID) | ORAL | 0 refills | Status: DC

## 2017-04-29 MED ORDER — VANCOMYCIN HCL IN DEXTROSE 750-5 MG/150ML-% IV SOLN
750.0000 mg | Freq: Three times a day (TID) | INTRAVENOUS | Status: DC
  Filled 2017-04-29: qty 150

## 2017-04-29 MED ORDER — INSULIN DETEMIR 100 UNIT/ML ~~LOC~~ SOLN: 20.0000 [IU] | Freq: Every day | SUBCUTANEOUS | 11 refills | Status: DC

## 2017-04-29 NOTE — Progress Notes (Signed)
ANTIBIOTIC CONSULT NOTE-Preliminary  Pharmacy Consult for Vancomycin Indication: Bacteremia  No Known Allergies  Patient Measurements: Height: 5\' 11"  (180.3 cm) Weight: 176 lb 2.4 oz (79.9 kg) IBW/kg (Calculated) : 75.3  Vital Signs: Temp: 99.5 F (37.5 C) (12/13 2100) Temp Source: Oral (12/13 2100) BP: 132/74 (12/13 2100) Pulse Rate: 108 (12/13 2100)  Labs: Recent Labs    04/26/17 1402 04/27/17 0533 04/28/17 0412  WBC 16.0* 17.9* 16.7*  HGB 8.7* 8.6* 7.7*  PLT 234 232 258  CREATININE 2.69* 2.09* 1.29*    Estimated Creatinine Clearance: 63.2 mL/min (A) (by C-G formula based on SCr of 1.29 mg/dL (H)).  No results for input(s): VANCOTROUGH, VANCOPEAK, VANCORANDOM, GENTTROUGH, GENTPEAK, GENTRANDOM, TOBRATROUGH, TOBRAPEAK, TOBRARND, AMIKACINPEAK, AMIKACINTROU, AMIKACIN in the last 72 hours.   Microbiology: Recent Results (from the past 720 hour(s))  Blood culture (routine x 2)     Status: None (Preliminary result)   Collection Time: 04/26/17  6:27 PM  Result Value Ref Range Status   Specimen Description RIGHT ANTECUBITAL  Final   Special Requests   Final    BOTTLES DRAWN AEROBIC AND ANAEROBIC Blood Culture results may not be optimal due to an inadequate volume of blood received in culture bottles   Culture NO GROWTH 2 DAYS  Final   Report Status PENDING  Incomplete  Blood culture (routine x 2)     Status: None (Preliminary result)   Collection Time: 04/26/17  6:27 PM  Result Value Ref Range Status   Specimen Description BLOOD RIGHT HAND  Final   Special Requests   Final    BOTTLES DRAWN AEROBIC AND ANAEROBIC Blood Culture results may not be optimal due to an inadequate volume of blood received in culture bottles   Culture  Setup Time   Final    GRAM POSITIVE COCCI BLOOD ANAEROBIC BOTTLE Gram Stain Report Called to,Read Back By and Verified With: BULLOCK,[email or web address]  BYMATTHEWS, B 12.14.18 [facility]    Culture NO GROWTH 2 DAYS  Final   Report Status PENDING   Incomplete    Medical History: Past Medical History:  Diagnosis Date  . Diabetes mellitus without complication ([place])   . Hypertension   . Prostate hypertrophy     Medications:   Assessment: 61 yo male admitted 12/11 s/p fall with closed pelvic fracture and subsequent pulmonary embolism. Blood cultures drawn on admission now positive with gram stain from anaerobic bottle showing gram positive cocci. Pharmacy has been consulted for vancomycin dosing for bacteremia.  Goal of Therapy:  Vancomycin troughs 15-20 mcg/ml  Plan:  Preliminary review of pertinent patient information completed.  Protocol will be initiated with loading dose of 1250 mg IV vancomycin. [facility] clinical pharmacist will complete review during morning rounds to assess patient and finalize treatment regimen if needed.  Norberto Sorenson, [facility] 04/29/2017,4:07 AM

## 2017-04-29 NOTE — Discharge Summary (Addendum)
Physician Discharge Summary  Daniel Campos WSF:[id number] DOB: [birth year] DOA: 04/26/2017  PCP: [facility]  Admit date: 04/26/2017 Discharge date: 04/29/2017  Time spent: 45 minutes  Recommendations for Outpatient Follow-up:  -Will be discharged home today. -Has been newly started on Eliquis for treatment of pulmonary embolism. -Has also been discharged with Foley catheter due to acute urinary retention, follow-up with urology within 7-10 days. -Advised to follow-up with PCP in 2 weeks for continued diabetic management, of note his long-acting insulin has been decreased due to persistent hypoglycemia.  Discharge Diagnoses:  Principal Problem:   ARF (acute renal failure) ([place]) Active Problems:   Diabetes ([place])   Essential hypertension, benign   Tachycardia   Anemia   Hyponatremia   Acute urinary retention   Bacteremia due to Gram-positive bacteria   Pulmonary embolism [facility])   Discharge Condition: Stable and improved  Filed Weights   04/26/17 2052  Weight: 79.9 kg (176 lb 2.4 oz)    History of present illness:  As per Dr. Maudie Mercury on 12/11: Daniel Campos  is a 61 y.o. male, w hypertension, dm2, bph who  apparently had recent fall with pubic rami fracture.  Unable to move and walk. Over the weekend his po intake has decreased.   Pt has had decrease in urine output.  Pt notes that he has been taking ibuprofen 2-4 pills per day. His lower abdomen was swelling slightly and due to decrease in urine output pt presented to Ed.    In ED.  CT chest/ abd/pelvis IMPRESSION: Mildly displaced fractures involving the right inferior and superior pubic rami.  Bilateral nonobstructive nephrolithiasis.  Aortic atherosclerosis.  Coronary artery calcifications are noted suggesting coronary artery disease.  Bronchial thickening and airspace opacity is noted posteriorly in superior segment of right lower lobe concerning for focal inflammation or possibly  pneumonia.  Old proximal right humeral neck fracture.  Wbc 16.0,  Hgb 8.7, Plt 234 Na 131, K 5.1,  Bun 93, Creatinine 2.69 Ast 16, Alt 15, Alb 3.2 Hco3=19 Glucose 166 Lipase 16 Trop <0.03  Pt will be admitted for ARF, hyponatremia, and possible RLL pneumonia.     Hospital Course:   Acute renal failure -Suspect mainly due to acute urinary retention with also possibly a prerenal component due to decreased oral intake with pain from pelvic fracture. -Acute urinary retention likely due to narcotic usage for pelvic fracture. -We will plan to keep Foley in upon discharge and schedule outpatient follow-up with urology for voiding trial. -Creatinine has normalized to 0.92 on discharge.  Acute pulmonary embolism/acute hypoxemic respiratory failure -VQ scan with high probability for PE. -Has been started on Eliquis. -Will need to be treated with anticoagulation for at least 9-12 months. -Has no current oxygen requirements.  Elevated troponin -Likely due to demand ischemia in face of acute PE. -2D echo without signs of right heart strain, EF of 60-65% with grade 1 diastolic dysfunction and no wall motion needs. -No further cardiac workup.  Type 2 diabetes -With significant hypoglycemia 2 mornings in a row. -We will decrease his long-acting insulin from 26-20 units. -Will need continued outpatient diabetic monitoring.    Gram-positive bacteremia -1 out of 2 blood cultures with coag negative staph. -Consider this to be a contaminant, patient remains afebrile and nontoxic. -Received 1 dose of vancomycin which has been subsequently discontinued, no further need for antibiotic therapy.     Procedures:  None   Consultations:  None  Discharge Instructions  Discharge Instructions  Diet - low sodium heart healthy   Complete by:  As directed    Increase activity slowly   Complete by:  As directed      Allergies as of 04/29/2017   No Known Allergies       Medication List    STOP taking these medications   meloxicam 15 MG tablet Commonly known as:  MOBIC   pravastatin 10 MG tablet Commonly known as:  PRAVACHOL   ranitidine 150 MG tablet Commonly known as:  ZANTAC     TAKE these medications   aspirin 81 MG tablet Take 81 mg by mouth daily.   B-complex with vitamin C tablet Take 1 tablet by mouth daily.   capsaicin 0.025 % cream Commonly known as:  ZOSTRIX Apply 1 application topically 3 (three) times daily.   ELIQUIS STARTER PACK 5 MG Tabs Take 10 mg by mouth 2 (two) times daily.   apixaban 5 MG Tabs tablet Commonly known as:  ELIQUIS Take 1 tablet (5 mg total) by mouth 2 (two) times daily. Start taking on:  05/05/2017   gabapentin 300 MG capsule Commonly known as:  NEURONTIN Take 1 capsule by mouth 3 (three) times daily.   insulin detemir 100 UNIT/ML injection Commonly known as:  LEVEMIR Inject 0.2 mLs (20 Units total) into the skin at bedtime. What changed:  how much to take   Melatonin 10 MG Tabs Take 10 mg by mouth at bedtime.   NOVOLOG FLEXPEN 100 UNIT/ML FlexPen Generic drug:  insulin aspart Inject 5 Units into the skin 3 (three) times daily with meals.   omeprazole 40 MG capsule Commonly known as:  PRILOSEC Take 40 mg by mouth every morning.   ondansetron 4 MG tablet Commonly known as:  ZOFRAN Take 1 tablet (4 mg total) by mouth every 8 (eight) hours as needed for nausea or vomiting.   oxyCODONE-acetaminophen 5-325 MG tablet Commonly known as:  PERCOCET/ROXICET Take 1-2 tablets by mouth every 6 (six) hours as needed for severe pain.   VITAMIN D PO Take 1 tablet by mouth daily.            Durable Medical Equipment  (From admission, onward)        Start     Ordered   04/29/17 1051  For home use only DME 3 n 1  Once     04/29/17 1050   04/29/17 1051  For home use only DME standard manual wheelchair with seat cushion  Once    Comments:  Patient suffers from pelvic fracture which impairs  their ability to perform daily activities like ambulation in the home.  A walkre will not resolve  issue with performing activities of daily living. A wheelchair will allow patient to safely perform daily activities. Patient can safely propel the wheelchair in the home or has a caregiver who can provide assistance.  Accessories: elevating leg rests (ELRs), wheel locks, extensions and anti-tippers.   04/29/17 1050     No Known Allergies Follow-up [facility], [place]. Schedule an appointment as soon as possible for a visit in 2 week(s).   Contact information: [place] [address] [phone]        Franchot Gallo, MD. Schedule an appointment as soon as possible for a visit in 1 week(s).   Specialty:  Urology Contact information: [address] [place] [address] [phone]            The results of significant  diagnostics from this hospitalization (including imaging, microbiology, ancillary and laboratory) are listed below for reference.    Significant Diagnostic Studies: Ct Abdomen Pelvis Wo Contrast  Result Date: 04/26/2017 CLINICAL DATA:  Right hip pain and abdominal distention after fall off roof 4 days ago EXAM: CT CHEST, ABDOMEN AND PELVIS WITHOUT CONTRAST TECHNIQUE: Multidetector CT imaging of the chest, abdomen and pelvis was performed following the standard protocol without IV contrast. COMPARISON:  CT scan of May 02, 2015. FINDINGS: CT CHEST FINDINGS Cardiovascular: Atherosclerosis of thoracic aorta is noted without aneurysm formation. Coronary artery calcifications are noted. Normal cardiac size. No pericardial effusion. Mediastinum/Nodes: No enlarged mediastinal, hilar, or axillary lymph nodes. Thyroid gland, trachea, and esophagus demonstrate no significant findings. Lungs/Pleura: No pneumothorax or pleural effusion is noted. Mild bilateral posterior basilar subsegmental atelectasis is noted. Bronchial thickening and  opacity is noted posteriorly in the superior segment of the right lower lobe concerning for focal inflammation. Musculoskeletal: Old proximal right humeral neck fracture is noted. CT ABDOMEN PELVIS FINDINGS Hepatobiliary: No focal liver abnormality is seen. No gallstones, gallbladder wall thickening, or biliary dilatation. Pancreas: Unremarkable. No pancreatic ductal dilatation or surrounding inflammatory changes. Spleen: Normal in size without focal abnormality. Adrenals/Urinary Tract: Adrenal glands appear normal. Bilateral nonobstructive nephrolithiasis is noted. No hydronephrosis or renal obstruction is noted. Urinary bladder is unremarkable. Stomach/Bowel: Stomach is within normal limits. Appendix appears normal. No evidence of bowel wall thickening, distention, or inflammatory changes. Vascular/Lymphatic: Aortic atherosclerosis. No enlarged abdominal or pelvic lymph nodes. Reproductive: Prostate is unremarkable. Other: No abdominal wall hernia or abnormality. No abdominopelvic ascites. Musculoskeletal: Mildly displaced fractures are seen involving the right inferior and superior pubic rami. Status post old surgical internal fixation of proximal right femur. IMPRESSION: Mildly displaced fractures involving the right inferior and superior pubic rami. Bilateral nonobstructive nephrolithiasis. Aortic atherosclerosis. Coronary artery calcifications are noted suggesting coronary artery disease. Bronchial thickening and airspace opacity is noted posteriorly in superior segment of right lower lobe concerning for focal inflammation or possibly pneumonia. Old proximal right humeral neck fracture. Electronically Signed   By: Marijo Conception, M.D.   On: 04/26/2017 15:56   Ct Chest Wo Contrast  Result Date: 04/26/2017 CLINICAL DATA:  Right hip pain and abdominal distention after fall off roof 4 days ago EXAM: CT CHEST, ABDOMEN AND PELVIS WITHOUT CONTRAST TECHNIQUE: Multidetector CT imaging of the chest, abdomen and  pelvis was performed following the standard protocol without IV contrast. COMPARISON:  CT scan of May 02, 2015. FINDINGS: CT CHEST FINDINGS Cardiovascular: Atherosclerosis of thoracic aorta is noted without aneurysm formation. Coronary artery calcifications are noted. Normal cardiac size. No pericardial effusion. Mediastinum/Nodes: No enlarged mediastinal, hilar, or axillary lymph nodes. Thyroid gland, trachea, and esophagus demonstrate no significant findings. Lungs/Pleura: No pneumothorax or pleural effusion is noted. Mild bilateral posterior basilar subsegmental atelectasis is noted. Bronchial thickening and opacity is noted posteriorly in the superior segment of the right lower lobe concerning for focal inflammation. Musculoskeletal: Old proximal right humeral neck fracture is noted. CT ABDOMEN PELVIS FINDINGS Hepatobiliary: No focal liver abnormality is seen. No gallstones, gallbladder wall thickening, or biliary dilatation. Pancreas: Unremarkable. No pancreatic ductal dilatation or surrounding inflammatory changes. Spleen: Normal in size without focal abnormality. Adrenals/Urinary Tract: Adrenal glands appear normal. Bilateral nonobstructive nephrolithiasis is noted. No hydronephrosis or renal obstruction is noted. Urinary bladder is unremarkable. Stomach/Bowel: Stomach is within normal limits. Appendix appears normal. No evidence of bowel wall thickening, distention, or inflammatory changes. Vascular/Lymphatic: Aortic atherosclerosis. No enlarged abdominal or pelvic lymph  nodes. Reproductive: Prostate is unremarkable. Other: No abdominal wall hernia or abnormality. No abdominopelvic ascites. Musculoskeletal: Mildly displaced fractures are seen involving the right inferior and superior pubic rami. Status post old surgical internal fixation of proximal right femur. IMPRESSION: Mildly displaced fractures involving the right inferior and superior pubic rami. Bilateral nonobstructive nephrolithiasis. Aortic  atherosclerosis. Coronary artery calcifications are noted suggesting coronary artery disease. Bronchial thickening and airspace opacity is noted posteriorly in superior segment of right lower lobe concerning for focal inflammation or possibly pneumonia. Old proximal right humeral neck fracture. Electronically Signed   By: Marijo Conception, M.D.   On: 04/26/2017 15:56   Ct Cervical Spine Wo Contrast  Result Date: 04/22/2017 CLINICAL DATA:  Fall. EXAM: CT CERVICAL SPINE WITHOUT CONTRAST TECHNIQUE: Multidetector CT imaging of the cervical spine was performed without intravenous contrast. Multiplanar CT image reconstructions were also generated. COMPARISON:  CT scan of May 02, 2015. FINDINGS: Alignment: Normal. Skull base and vertebrae: No acute fracture. No primary bone lesion or focal pathologic process. Soft tissues and spinal canal: No prevertebral fluid or swelling. No visible canal hematoma. Disc levels: Anterior osteophyte formation is noted at C3-4 and C4-5. Severe degenerative disc disease is noted at C5-6 and C6-7 with anterior osteophyte formation. Upper chest: Negative. Other: None. IMPRESSION: Multilevel degenerative disc disease. No acute abnormality seen in the cervical spine. Electronically Signed   By: Marijo Conception, M.D.   On: 04/22/2017 13:46   Dg Pelvis Portable  Result Date: 04/22/2017 CLINICAL DATA:  Anterior right leg pain after falling 12 feet from a ladder. EXAM: PORTABLE PELVIS 1-2 VIEWS COMPARISON:  CT chest, abdomen, and pelvis dated May 02, 2015. FINDINGS: Irregularity of the right superior and inferior pubic rami, suspicious for nondisplaced fractures. Prior right femoral cephalomedullary rod fixation. Mild degenerative changes of both hip joints. Osteopenia. IMPRESSION: Nondisplaced fractures of the right superior and inferior pubic rami. Electronically Signed   By: Titus Dubin M.D.   On: 04/22/2017 12:56   Nm Pulmonary Perf And Vent  Result Date:  04/28/2017 CLINICAL DATA:  Generalized pain.  Prior history of fall . EXAM: NUCLEAR MEDICINE VENTILATION - PERFUSION LUNG SCAN TECHNIQUE: Ventilation images were obtained in multiple projections using inhaled aerosol Tc-78m DTPA. Perfusion images were obtained in multiple projections after intravenous injection of Tc-88m MAA. RADIOPHARMACEUTICALS:  33.0 MCi Technetium-74m DTPA aerosol inhalation and 4.9 mCi Technetium-35m MAA IV COMPARISON:  None. FINDINGS: Large right mid lung field perfusion defects larger than ventilation defects or chest CT abnormalities. Small left upper and lower mismatch perfusion defects. Findings suggest high probability pulmonary embolus. IMPRESSION: High probability pulmonary embolus. Electronically Signed   By: Marcello Moores  Register   On: 04/28/2017 12:10   Dg Chest Portable 1 View  Result Date: 04/22/2017 CLINICAL DATA:  Fall.  Diabetes.  Hypertension. EXAM: PORTABLE CHEST 1 VIEW COMPARISON:  CT 1 pleural scratched it CT 05/02/2015. Chest x-ray 05/02/2015. FINDINGS: Cardiomegaly mild pulmonary venous congestion. No focal infiltrate. No pleural effusion or pneumothorax. No acute bony abnormality. Stable deformity noted the right humerus. IMPRESSION: Cardiomegaly with mild pulmonary venous congestion. No focal infiltrate or pulmonary edema. Electronically Signed   By: Marcello Moores  Register   On: 04/22/2017 12:49   Dg Femur Portable Min 2 Views Right  Result Date: 04/22/2017 CLINICAL DATA:  Initial evaluation for a acute trauma, fall. EXAM: RIGHT FEMUR PORTABLE 2 VIEW COMPARISON:  None. FINDINGS: The IM fixation nail with interlocking screws in place within knee proximal femur. Postsurgical changes present about the right  knee. No evidence for hardware complication. No acute fracture or dislocation. No acute soft tissue abnormality. Moderate to advanced osteoarthritic changes present about the hip and knee. IMPRESSION: 1. No acute fracture or dislocation. 2. Sequelae of prior ORIF about the  right hip and knee without hardware complication. Electronically Signed   By: Jeannine Boga M.D.   On: 04/22/2017 12:55    Microbiology: Recent Results (from the past 240 hour(s))  Blood culture (routine x 2)     Status: None (Preliminary result)   Collection Time: 04/26/17  6:27 PM  Result Value Ref Range Status   Specimen Description RIGHT ANTECUBITAL  Final   Special Requests   Final    BOTTLES DRAWN AEROBIC AND ANAEROBIC Blood Culture results may not be optimal due to an inadequate volume of blood received in culture bottles   Culture NO GROWTH 3 DAYS  Final   Report Status PENDING  Incomplete  Blood culture (routine x 2)     Status: None (Preliminary result)   Collection Time: 04/26/17  6:27 PM  Result Value Ref Range Status   Specimen Description BLOOD RIGHT HAND  Final   Special Requests   Final    BOTTLES DRAWN AEROBIC AND ANAEROBIC Blood Culture results may not be optimal due to an inadequate volume of blood received in culture bottles   Culture  Setup Time   Final    GRAM POSITIVE COCCI AEROBIC BOTTLE ONLY Gram Stain Report Called to,Read Back By and Verified With: BULLOCK,T@0251  BYMATTHEWS, B 12.14.18 BULLOCK,T@0251  BYMATTHEWS, B 12.14.18 [facility] CRITICAL RESULT CALLED TO, READ BACK BY AND VERIFIED WITH: M HAYES,PHARMD AT 2671 04/29/17 BY L BENFIELD Performed at [facility], [place] [address] [place], [place] [address]    Culture GRAM POSITIVE COCCI  Final   Report Status PENDING  Incomplete  Blood Culture ID Panel (Reflexed)     Status: Abnormal   Collection Time: 04/26/17  6:27 PM  Result Value Ref Range Status   Enterococcus species NOT DETECTED NOT DETECTED Final   Listeria monocytogenes NOT DETECTED NOT DETECTED Final   Staphylococcus species DETECTED (A) NOT DETECTED Final    Comment: Methicillin (oxacillin) susceptible coagulase negative staphylococcus. Possible blood culture contaminant (unless isolated from more than one blood culture draw  or clinical case suggests pathogenicity). No antibiotic treatment is indicated for blood  culture contaminants. CRITICAL RESULT CALLED TO, READ BACK BY AND VERIFIED WITH: M HAYES,PHARMD AT 9983 04/29/17 BY L BENFIELD    Staphylococcus aureus NOT DETECTED NOT DETECTED Final   Methicillin resistance NOT DETECTED NOT DETECTED Final   Streptococcus species NOT DETECTED NOT DETECTED Final   Streptococcus agalactiae NOT DETECTED NOT DETECTED Final   Streptococcus pneumoniae NOT DETECTED NOT DETECTED Final   Streptococcus pyogenes NOT DETECTED NOT DETECTED Final   Acinetobacter baumannii NOT DETECTED NOT DETECTED Final   Enterobacteriaceae species NOT DETECTED NOT DETECTED Final   Enterobacter cloacae complex NOT DETECTED NOT DETECTED Final   Escherichia coli NOT DETECTED NOT DETECTED Final   Klebsiella oxytoca NOT DETECTED NOT DETECTED Final   Klebsiella pneumoniae NOT DETECTED NOT DETECTED Final   Proteus species NOT DETECTED NOT DETECTED Final   Serratia marcescens NOT DETECTED NOT DETECTED Final   Haemophilus influenzae NOT DETECTED NOT DETECTED Final   Neisseria meningitidis NOT DETECTED NOT DETECTED Final   Pseudomonas aeruginosa NOT DETECTED NOT DETECTED Final   Candida albicans NOT DETECTED NOT DETECTED Final   Candida glabrata NOT DETECTED NOT DETECTED Final  Candida krusei NOT DETECTED NOT DETECTED Final   Candida parapsilosis NOT DETECTED NOT DETECTED Final   Candida tropicalis NOT DETECTED NOT DETECTED Final    Comment: Performed at [facility], [place] [address], [place], [place] [address]     Labs: Basic Metabolic Panel: Recent Labs  Lab 04/26/17 1402 04/27/17 0533 04/28/17 0412 04/29/17 0604  NA 131* 132* 135 136  K 5.1 5.0 3.9 4.0  CL 98* 101 106 104  CO2 19* 19* 21* 26  GLUCOSE 166* 284* 38* 96  BUN 93* 87* 54* 26*  CREATININE 2.69* 2.09* 1.29* 0.92  CALCIUM 7.9* 7.9* 8.0* 8.1*   Liver Function Tests: Recent Labs  Lab 04/26/17 1402 04/27/17 0533  AST  16 16  ALT 15* 15*  ALKPHOS 90 86  BILITOT 1.0 1.1  PROT 7.1 7.0  ALBUMIN 3.2* 3.0*   Recent Labs  Lab 04/26/17 1402  LIPASE 16   No results for input(s): AMMONIA in the last 168 hours. CBC: Recent Labs  Lab 04/26/17 1402 04/26/17 1827 04/27/17 0533 04/28/17 0412 04/29/17 0604  WBC 16.0*  --  17.9* 16.7* 14.7*  NEUTROABS 14.0*  --   --   --   --   HGB 8.7*  --  8.6* 7.7* 7.8*  HCT 26.8* 25.4* 26.3* 23.7* 24.7*  MCV 88.7  --  89.2 86.8 87.3  PLT 234  --  232 258 290   Cardiac Enzymes: Recent Labs  Lab 04/26/17 1506 04/26/17 1827 04/26/17 2354 04/27/17 0533  TROPONINI <0.03 0.04* 0.08* 0.11*   BNP: BNP (last 3 results) No results for input(s): BNP in the last 8760 hours.  ProBNP (last 3 results) No results for input(s): PROBNP in the last 8760 hours.  CBG: Recent Labs  Lab 04/29/17 0200 04/29/17 0628 04/29/17 0801 04/29/17 0828 04/29/17 1122  GLUCAP 176* 93 51* 131* 73       Signed:  [facility] Pager: [phone] 04/29/2017, 3:56 PM

## 2017-04-29 NOTE — Progress Notes (Signed)
CRITICAL VALUE ALERT  Critical Value:  CBG   Date & Time Notied:  04/29/2017@ 6773PV  Provider Notified: Dr Jerilee Hoh  Orders Received/Actions taken: yes

## 2017-04-29 NOTE — Progress Notes (Signed)
Patient discharged with foley catheter, per Dr Ledell Noss orders. Patient,and family educated on foley catheter care, verbalized understanding. Message left with urologist office,to call patient with appointment time, office was closed at the time patient was discharged, so message was left with receptionist..

## 2017-04-29 NOTE — Progress Notes (Signed)
IV discontinued,catheter intact. Discharge instructions given on medications,and follow up visits,patient verbalized understanding. Prescriptions sent to Pharmacy of choice documented ov AVS. Accompanied by staff to an awaiting vehicle.

## 2017-04-29 NOTE — Progress Notes (Signed)
PHARMACY - PHYSICIAN COMMUNICATION CRITICAL VALUE ALERT - BLOOD CULTURE IDENTIFICATION (BCID)  Daniel Campos is an 61 y.o. male who presented to [facility] on 04/26/2017 with a chief complaint of fall with closed pelvic fracture and subsequent pulmonary embolism.  Assessment:  Blood cultures drawn 12/11 on admission now positive with gram stain of anaerobic bottle showing gram positive cocci.Name of physician (or Provider) Contacted: dr. Eleonore Chiquito  Current antibiotics:  Changes to prescribed antibiotics recommended:   Vancomycin ordered per pharmacy dosing  No results found for this or any previous visit.  Norberto Sorenson 04/29/2017  4:18 AM

## 2017-04-29 NOTE — Care Management Important Message (Signed)
Important Message  Patient Details  Name: Daniel Campos MRN: [id number] Date of Birth: 1955/11/12   Medicare Important Message Given:  Yes    Sherald Barge, RN 04/29/2017, 2:59 PM

## 2017-04-29 NOTE — Care Management (Addendum)
Patient Information   SS# [id number]  Patient Name Daniel Campos, Daniel Campos ([id number]) Sex Male DOB 1955/10/28  Room Bed  A313 A313-01  Patient Demographics   Address [place] [address] Phone [phone] (Home) *[phone] (Mobile)  Patient Ethnicity & Race   Ethnic Group Patient Race  Not Hispanic or Latino White or Caucasian  Emergency Contact(s)   Name Relation Home Work Mobile  Leroy Kennedy Daughter   [phone]  Dacey,michelle Daughter   [phone]  awad, gladd   [phone]  shimkus,pam Other   [phone]  Documents on File    Status Date Received Description  Documents for the Patient  [place] Received 10/13/10   [place] E-Signature HIPAA Notice of Privacy Received 12/20/12   [place] E-Signature HIPAA Notice of Privacy Spanish Not Received    Driver's License Received 76/72/09   Insurance Card Received 11/16/10   Advance Directives/Living Will/HCPOA/POA Not Received    [place] HIPAA NOTICE OF PRIVACY - Scanned Not Received    Financial Application Not Received    Driver's License Received 47/09/62   Insurance Card Not Received    Advanced Beneficiary Notice (ABN) Not Received    Insurance Card Received 08/16/12   Insurance Card Received 08/16/12 [facility] [place] E-Signature HIPAA Notice of Privacy Received 08/16/12 [facility]  AMB Correspondence Not Received  07/14 FL2  AMB Correspondence Not Received  07/14 rx [place]  AMB Correspondence Not Received  07/14 Rx [place] HIPAA NOTICE OF PRIVACY - Scanned Received 12/21/12 DPR/ROSM  [place] HIPAA NOTICE OF PRIVACY - Scanned Not Received    HIM ROI Authorization Not Received    HIM ROI Authorization Not Received    HIM ROI Authorization Not Received (Expired)  RFM  AMB HH/NH/Hospice Not Received  08/14 prof comm Adv [facility]  HIM ROI Authorization Not Received  [facility] Request/Release of Authorization  HIM  ROI Authorization Not Received    HIM ROI Authorization Not Received    HIM ROI Authorization Not Received    AMB HH/NH/Hospice Not Received  08/14 [place] [facility]  HIM ROI Authorization Not Received (Expired)  disability needing records to determine claim for patient.  Release of Information Not Received    Release of Information Not Received    AMB Correspondence Not Received  09/14 clinical note Nida MD, G  AMB Correspondence Not Received  09/14 office note Nida MD,G  Insurance Card Received 03/21/13 BCBS Effec 03/17/13  Insurance Card Not Received    HIM ROI Authorization Not Received    AMB Correspondence Not Received  10/14 letter [employer]  AMB Correspondence Not Received  10/14 office note Nida MD, G  Insurance Card Received 04/10/13   AMB Provider Completed Forms  06/01/13 [place] form [facility]   AMB Provider Completed Forms  07/20/13 assess [employer] Card Received 08/14/13 bcbs  Insurance Card  08/14/13   Release of Information  08/23/13   AMB Correspondence  01/25/14 OFFICE NOTES NIDA MD, G  E-Signature AOB Spanish Not Received    Other Photo ID Not Received    HIM ROI Authorization  05/22/15 J. Jose [place]  HIM ROI Authorization  05/27/15 no auth  Release of Information  06/17/15   Release of Information  10/30/15   Insurance Card Received 04/29/16 [employer]  Release of Information Received 04/29/16 [facility]  HIM ROI Authorization  05/03/16   HIM ROI Authorization  05/04/16  AMB Correspondence  04/15/16 REQUEST FOR OUTPATIENT [facility] [place] [facility] [employer]  HIM ROI Authorization  08/31/16   AMB Outside Hospital Record  09/07/16 [facility] Record  09/07/16 [place] Card Not Received (Deleted)    Insurance Card Received (Deleted) 12/20/12 [employer]  Insurance Card Not Received (Deleted)    Insurance Card Not Received (Deleted)    AMB Correspondence (Deleted) 04/15/16 [place]  [facility] [employer]  Patient Photo   Photo of Patient  Documents for the Encounter  AOB (Assignment of Insurance Benefits) Not Received    E-signature AOB Signed 04/26/17   MEDICARE RIGHTS Not Received    E-signature Medicare Rights     ED Patient Billing Extract   ED PB Billing Extract  Cardiac Monitoring Strip Shift Summary Received 04/26/17   Authorization Request   [facility]#  After Visit Summary   IP After Visit Summary  EKG Received 04/27/17   Admission Information   Attending Provider Admitting Provider Admission Type Admission Date/Time   Jani Gravel, MD Emergency 04/26/17 1229  Discharge Date Hospital Service Auth/Cert Status Service Area  04/29/17 Acute Care Incomplete [place]  Unit Room/Bed Admission Status   [facility]-DEPT 300 A313/A313-01 Discharged (Confirmed)   Admission   Complaint  Closed fracture of ramus of right pubis, initial encounter [facility], constipation  Hospital Account   Name Acct ID Class Status Primary Coverage  Daniel Campos, Daniel Campos [id number] Inpatient Open MEDICAID POTENTIAL - MEDICAID POTENTIAL      Guarantor Account (for Hospital Account [id number])   Name Relation to Zachary? Acct Type  Daniel Campos, Daniel Campos Yes Personal/Family  Address Phone    [address] [place], [place] [address] [phone])        Coverage Information (for Hospital Account [id number])   F/O Payor/Plan Precert #  MEDICAID POTENTIAL/MEDICAID POTENTIAL   Subscriber Subscriber #  Daniel Campos, Daniel Campos [id number]  Address Phone  [address] [place], [place] [address] [phone]

## 2017-04-29 NOTE — Evaluation (Signed)
Physical Therapy Evaluation Patient Details Name: Daniel Campos MRN: [id number] DOB: [birth year] Today's Date: 04/29/2017   History of Present Illness  Daniel Campos is a 61yo whit e male who comes to [facility] on 04/26/17 d/t abd swelling and decreased urine output. PMH: HTN, BPH, DM2, recent Rt pubic ramus fractures (1WA) s/p fall, Rt hip IM nail s/p fracture, Rt humerus fracture.  Pt came to the ED on 12/7, noted to have pelvis fractures and DC to home. upon return, pt found to have PE now on Hep x2D, and noted to be in ARF. Pt reports he was only back to home for ~2days, but was largely bed bound and unable to mobilize to toilet or OOB. Pt lives with daughter. He reports no mobility restrictions at baseline, no balance deficts, although as noted above Hx of multiple falls and fractures.    Clinical Impression  Pt admitted with above diagnosis. Pt currently with functional limitations due to the deficits listed below (see "PT Problem List"). Upon entry, the patient is received supine in bed, no family/caregiver present. The pt is awake and agreeable to participate. Pt reporting 7-8/10 pain in Right pelvis, reports not having yet received pain meds, but agreeable to attempt basic mobility. The pt is alert and oriented x3, pleasant, and conversational, expresses some frustration over confusion regarding his medical care currently. Functional mobility assessment demonstrates moderate to severe strength impairment in RLE d/t pain inhibition related to acute Rt pelvic fractures, but is able to perform bed mobility and transfers at supervision level after education on self assist. Basic mobility requires near maximal effort and additional time. AMB is not appropriate at this time d/t high levels of exertion required for standing pivot transfer. Pt has no functional mobility limitations or restrictions at baseline. Pt will benefit from skilled PT intervention to increase independence and safety with basic  mobility in preparation for discharge to the venue listed below.    Patient suffers from RLE weakness and pain which impairs their ability to perform daily activities like household mobility, and access to toileting/bathing/dining facilities in the home.  A walker alone will not resolve the issues with performing activities of daily living. A wheelchair will allow patient to safely perform daily activities.  The patient can self propel in the home or has a caregiver who can provide assistance.        Follow Up Recommendations Home health PT;Supervision for mobility/OOB    Equipment Recommendations  Wheelchair (measurements PT);Wheelchair cushion (measurements PT);3in1 (PT)(Elevated Leg Rests, Anti-tipper devices )    Recommendations for Other Services       Precautions / Restrictions Precautions Precautions: Fall Restrictions Weight Bearing Restrictions: No Other Position/Activity Restrictions: Rt LE weakness d/t pain      Mobility  Bed Mobility Overal bed mobility: Needs Assistance Bed Mobility: Supine to Sit     Supine to sit: Supervision     General bed mobility comments: VC for left foot hook for self assist of Right leg for OOB; noted to have pulled IV out once at EOB, RN notified. (mild dizziness upon sitting; BP: 162/66mmHg)  Transfers Overall transfer level: Needs assistance Equipment used: Rolling walker (2 wheeled) Transfers: Sit to/from Stand Sit to Stand: Supervision         General transfer comment: good UE strength, heavy BUE weightbearing, good safety awareness, low impulsivity,    Ambulation/Gait                Stairs  Wheelchair Mobility    Modified Rankin (Stroke Patients Only)       Balance Overall balance assessment: History of Falls;Modified Independent Sitting-balance support: No upper extremity supported;Feet supported Sitting balance-Leahy Scale: Good     Standing balance support: During functional  activity;Bilateral upper extremity supported Standing balance-Leahy Scale: Fair                               Pertinent Vitals/Pain Pain Assessment: 0-10 Pain Score: 8  Pain Location: Right posterior pelvis/"tailbone" Pain Descriptors / Indicators: Aching Pain Intervention(s): Limited activity within patient's tolerance;Monitored during session;Patient requesting pain meds-RN notified    Home Living Family/patient expects to be discharged to:: Private residence Living Arrangements: Children(daughter) Available Help at Discharge: Family Type of Home: Mobile home Home Access: Stairs to enter Entrance Stairs-Rails: Can reach both;Left;Right Entrance Stairs-Number of Steps: 1 Home Layout: One level Home Equipment: [place] - 2 wheels;Shower seat      Prior Function Level of Independence: Independent               Hand Dominance   Dominant Hand: Right    Extremity/Trunk Assessment   Upper Extremity Assessment Upper Extremity Assessment: Overall WFL for tasks assessed    Lower Extremity Assessment Lower Extremity Assessment: Overall WFL for tasks assessed;RLE deficits/detail RLE Deficits / Details: weakness d/t pain inhibition RLE: Unable to fully assess due to pain       Communication   Communication: No difficulties  Cognition Arousal/Alertness: Awake/alert Behavior During Therapy: WFL for tasks assessed/performed;Anxious(frustrated, he reports he is not clear on what is going on in his plan of care.) Overall Cognitive Status: Within Functional Limits for tasks assessed                                        General Comments      Exercises     Assessment/Plan    PT Assessment Patient needs continued PT services  PT Problem List Decreased strength;Decreased range of motion;Decreased activity tolerance;Decreased mobility;Pain;Cardiopulmonary status limiting activity       PT Treatment Interventions DME instruction;Functional  mobility training;Therapeutic activities;Therapeutic exercise;Wheelchair mobility training;Patient/family education    PT Goals (Current goals can be found in the Care Plan section)  Acute Rehab PT Goals Patient Stated Goal: Regain some independence in mobility at home.  PT Goal Formulation: With patient Time For Goal Achievement: 05/13/17 Potential to Achieve Goals: Fair    Frequency 7X/week   Barriers to discharge Inaccessible home environment;Decreased caregiver support(has one step, but can access in [place]; Home is WC accessible. ) pt is alone periodically, but has additional family help    Co-evaluation               AM-PAC PT "6 Clicks" Daily Activity  Outcome Measure Difficulty turning over in bed (including adjusting bedclothes, sheets and blankets)?: A Lot Difficulty moving from lying on back to sitting on the side of the bed? : A Lot Difficulty sitting down on and standing up from a chair with arms (e.g., wheelchair, bedside commode, etc,.)?: A Lot Help needed moving to and from a bed to chair (including a wheelchair)?: A Little Help needed walking in hospital room?: A Lot Help needed climbing 3-5 steps with a railing? : Total 6 Click Score: 12    End of Session   Activity Tolerance: Patient tolerated treatment  well;Patient limited by pain;Patient limited by fatigue Patient left: in chair;with nursing/sitter in room;with call bell/phone within reach Nurse Communication: Mobility status;Patient requests pain meds(IV pulled, bleeding onto bed ) PT Visit Diagnosis: Unsteadiness on feet (R26.81);Difficulty in walking, not elsewhere classified (R26.2);Pain;History of falling (Z91.81);Muscle weakness (generalized) (M62.81) Pain - Right/Left: Right Pain - part of body: Hip    Time: 3338-3291 PT Time Calculation (min) (ACUTE ONLY): 29 min   Charges:   PT Evaluation $PT Eval High Complexity: 1 High PT Treatments $Therapeutic Activity: 8-22 mins   PT G Codes:   PT  G-Codes **NOT FOR INPATIENT CLASS** Functional Assessment Tool Used: AM-PAC 6 Clicks Basic Mobility Functional Limitation: Changing and maintaining body position Changing and Maintaining Body Position Current Status (B1660): At least 60 percent but less than 80 percent impaired, limited or restricted Changing and Maintaining Body Position Goal Status (A0045): At least 40 percent but less than 60 percent impaired, limited or restricted    11:02 AM, 04/29/17 Etta Grandchild, PT, DPT Physical Therapist - [place] [phone] (Office)   Buccola,Allan C 04/29/2017, 10:53 AM

## 2017-04-29 NOTE — Progress Notes (Signed)
Patient blood cultures reported to MD.  Antibiotics ordered.  Patient resting comfortably in no distress.

## 2017-04-29 NOTE — Care Management Note (Signed)
Case Management Note  Patient Details  Name: Daniel Campos MRN: [id number] Date of Birth: 1955-11-07  Expected Discharge Date:  04/29/17               Expected Discharge Plan:  [place]  In-House Referral:  NA  Discharge planning Services  CM Consult  Post Acute Care Choice:  Durable Medical Equipment, Home Health Choice offered to:  Patient  DME Arranged:  3-N-1, Wheelchair manual DME Agency:  [place]:  PT [facility] Agency:  [place]  Status of Service:  Completed, signed off  Additional Comments: Discharging home today. Pt need 3 in 1 and WC. Pt has chosen [facility] from list of DME providers. Brunilda Payor, [facility] rep, is aware of referral and will pull pt info from chart and deliver DME to pt room. Pt needs HH PT. CM has attempted to contact [facility] and relay info. CM waiting for return call. CM is going to fax DC summary and Gibson needs to [facility] in hopes it will be forwarded to PCP office d/t it being Friday afternoon. Pt is aware PT will not be arranged quickly and he is okay with this, he is not sure when he will able to fully participate.    Sherald Barge, RN 04/29/2017, 3:00 PM

## 2017-04-29 NOTE — Progress Notes (Signed)
Inpatient Diabetes Program Recommendations  AACE/ADA: New Consensus Statement on Inpatient Glycemic Control (2015)  Target Ranges:  Prepandial:   less than 140 mg/dL      Peak postprandial:   less than 180 mg/dL (1-2 hours)      Critically ill patients:  140 - 180 mg/dL   Results for Daniel Campos, Daniel Campos (MRN [id number]) as of 04/29/2017 11:32  Ref. Range 04/28/2017 07:40 04/28/2017 08:02 04/28/2017 12:00 04/28/2017 16:45 04/28/2017 21:54  Glucose-Capillary Latest Ref Range: 65 - 99 mg/dL 25 (LL) 91 103 (H) 269 (H) 146 (H)   Results for Daniel Campos, Daniel Campos (MRN [id number]) as of 04/29/2017 11:32  Ref. Range 04/29/2017 08:01 04/29/2017 08:28 04/29/2017 11:22  Glucose-Capillary Latest Ref Range: 65 - 99 mg/dL 51 (L) 131 (H) 73    Home DM Meds: Levemir 26 units QHS       Novolog 5 units TID  Current Insulin Orders: Levemir 26 units QHS      Novolog Sensitive Correction Scale/ SSI (0-9 units) TID AC + HS        MD- Patient with AM Hypoglycemia yesterday and again today.  Please consider reducing Levemir to 20 units QHS (20% reduction)      --Will follow patient during hospitalization--  Wyn Quaker RN, MSN, CDE Diabetes Coordinator Inpatient Glycemic Control Team Team Pager: [phone] (8a-5p)

## 2017-04-29 NOTE — Progress Notes (Signed)
PHARMACY - PHYSICIAN COMMUNICATION CRITICAL VALUE ALERT - BLOOD CULTURE IDENTIFICATION (BCID)  Daniel Campos is an 61 y.o. male who presented to [facility] on 04/26/2017 with a chief complaint of fall with closed pelvic fracture and subsequent pulmonary embolism.  Assessment:  Blood cultures drawn 12/11 on admission now positive with 1/2 CONS.  Name of physician (or Provider) Contacted: Dr. Jerilee Hoh  Current antibiotics: Vancomycin  Changes to prescribed antibiotics recommended: Patient with no e/o infection. Appears that cx most likely a contaminent. Antibiotics discontinued  Recommendations accepted by provider  Results for orders placed or performed during the hospital encounter of 04/26/17  Blood Culture ID Panel (Reflexed) (Collected: 04/26/2017  6:27 PM)  Result Value Ref Range   Enterococcus species NOT DETECTED NOT DETECTED   Listeria monocytogenes NOT DETECTED NOT DETECTED   Staphylococcus species DETECTED (A) NOT DETECTED   Staphylococcus aureus NOT DETECTED NOT DETECTED   Methicillin resistance NOT DETECTED NOT DETECTED   Streptococcus species NOT DETECTED NOT DETECTED   Streptococcus agalactiae NOT DETECTED NOT DETECTED   Streptococcus pneumoniae NOT DETECTED NOT DETECTED   Streptococcus pyogenes NOT DETECTED NOT DETECTED   Acinetobacter baumannii NOT DETECTED NOT DETECTED   Enterobacteriaceae species NOT DETECTED NOT DETECTED   Enterobacter cloacae complex NOT DETECTED NOT DETECTED   Escherichia coli NOT DETECTED NOT DETECTED   Klebsiella oxytoca NOT DETECTED NOT DETECTED   Klebsiella pneumoniae NOT DETECTED NOT DETECTED   Proteus species NOT DETECTED NOT DETECTED   Serratia marcescens NOT DETECTED NOT DETECTED   Haemophilus influenzae NOT DETECTED NOT DETECTED   Neisseria meningitidis NOT DETECTED NOT DETECTED   Pseudomonas aeruginosa NOT DETECTED NOT DETECTED   Candida albicans NOT DETECTED NOT DETECTED   Candida glabrata NOT DETECTED NOT DETECTED   Candida krusei NOT DETECTED NOT DETECTED   Candida parapsilosis NOT DETECTED NOT DETECTED   Candida tropicalis NOT DETECTED NOT DETECTED    Isac Sarna, BS [place], BCPS Clinical Pharmacist Pager [phone] 04/29/2017  8:21 AM

## 2017-05-01 LAB — CULTURE, BLOOD (ROUTINE X 2): Culture: NO GROWTH

## 2017-05-06 DIAGNOSIS — R338 Other retention of urine: Secondary | ICD-10-CM | POA: Diagnosis not present

## 2017-05-12 DIAGNOSIS — R338 Other retention of urine: Secondary | ICD-10-CM | POA: Diagnosis not present

## 2017-05-26 DIAGNOSIS — R338 Other retention of urine: Secondary | ICD-10-CM | POA: Diagnosis not present

## 2017-07-05 DIAGNOSIS — R351 Nocturia: Secondary | ICD-10-CM | POA: Diagnosis not present

## 2017-07-05 DIAGNOSIS — R3914 Feeling of incomplete bladder emptying: Secondary | ICD-10-CM | POA: Diagnosis not present

## 2017-07-05 DIAGNOSIS — N401 Enlarged prostate with lower urinary tract symptoms: Secondary | ICD-10-CM | POA: Diagnosis not present

## 2017-07-13 DIAGNOSIS — N3000 Acute cystitis without hematuria: Secondary | ICD-10-CM | POA: Diagnosis not present

## 2017-07-13 DIAGNOSIS — N401 Enlarged prostate with lower urinary tract symptoms: Secondary | ICD-10-CM | POA: Diagnosis not present

## 2017-07-13 DIAGNOSIS — R3914 Feeling of incomplete bladder emptying: Secondary | ICD-10-CM | POA: Diagnosis not present

## 2017-10-06 DIAGNOSIS — R310 Gross hematuria: Secondary | ICD-10-CM | POA: Diagnosis not present

## 2017-10-06 DIAGNOSIS — R351 Nocturia: Secondary | ICD-10-CM | POA: Diagnosis not present

## 2017-10-06 DIAGNOSIS — R3914 Feeling of incomplete bladder emptying: Secondary | ICD-10-CM | POA: Diagnosis not present

## 2017-10-06 DIAGNOSIS — N401 Enlarged prostate with lower urinary tract symptoms: Secondary | ICD-10-CM | POA: Diagnosis not present

## 2018-02-01 ENCOUNTER — Encounter: Payer: Self-pay | Admitting: Internal Medicine

## 2018-02-23 ENCOUNTER — Ambulatory Visit (INDEPENDENT_AMBULATORY_CARE_PROVIDER_SITE_OTHER): Payer: Medicare Other | Admitting: Gastroenterology

## 2018-02-23 ENCOUNTER — Encounter: Payer: Self-pay | Admitting: Gastroenterology

## 2018-02-23 VITALS — BP 121/80 | HR 98 | Temp 96.9°F | Ht 71.0 in | Wt 175.0 lb

## 2018-02-23 DIAGNOSIS — R195 Other fecal abnormalities: Secondary | ICD-10-CM

## 2018-02-23 DIAGNOSIS — D649 Anemia, unspecified: Secondary | ICD-10-CM

## 2018-02-23 NOTE — Progress Notes (Signed)
Primary Care Physician:  [facility]  Primary Gastroenterologist:  Garfield Cornea, MD   Chief Complaint  Patient presents with  . Colonoscopy    consult; unable to do last time; +FIT  . Anemia    HPI:  Daniel Campos is a 62 y.o. male here to discuss options for further evaluation of heme + stool. We have been trying to complete a colonoscopy on this patient since 04/2016. He presented at that time for screening colonoscopy. He cancelled his procedure due to the flu. I saw him back in 08/2016 to reschedule his colonoscopy. He had done a "mock" trial with clear liquid diet, adjusting his levemir and novolog by 50% as previously recommended and states he had several episodes of hypoglycemia during the night. He was advised to discuss his DM meds during bowel prep with his endocrinologist. He called and cancelled his procedure the next month for unclear reasons.   In 04/2017 he was hospitalized for 04/2017 with acute renal failure after recent fall with pubic rami fracture. He was also found to have acute PE. During his hospitalization he was noted to have significant hypoglycemia two mornings, once at 7:40 am with glucose of 25 and once at 8am with glucose of 51.   Patient wants to pursue colonoscopy but afraid of hypoglycemia with clear liquids even with dose reduction of insulin. He has been found at home by daughter, unconscious due to hypoglycemia.   He reports occasional black stools, last time one week ago. No brbpr. Complains of fatigue but no sob, chest pain. No heartburn, dysphagia, abd pain. BM regular with occasional diarrhea for years. States he has labs every other month through [place]. States he has not required blood transfusions but his blood/iron remains "little low". On iron pill every day.    Looking at labs in [place], he had Hgb 10.3 on 04/22/17. On 04/26/17 Hgb 8.7 at time of admission and 7.8 at discharge. Ferritin normal. Iron low, b12 and folate  normal.  He has chronic anemia dating back to 2014 with Hgb in the 9-10 range.     Current Outpatient Medications  Medication Sig Dispense Refill  . apixaban (ELIQUIS) 5 MG TABS tablet Take 1 tablet (5 mg total) by mouth 2 (two) times daily. 60 tablet 2  . aspirin 81 MG tablet Take 81 mg by mouth daily.    . B Complex-C (B-COMPLEX WITH VITAMIN C) tablet Take 1 tablet by mouth daily.    . capsaicin (ZOSTRIX) 0.025 % cream Apply 1 application topically 3 (three) times daily.    . Cholecalciferol (VITAMIN D PO) Take 1 tablet by mouth daily.    . insulin detemir (LEVEMIR) 100 UNIT/ML injection Inject 0.2 mLs (20 Units total) into the skin at bedtime. (Patient taking differently: Inject 30 Units into the skin at bedtime. ) 10 mL 11  . Melatonin 10 MG TABS Take 10 mg by mouth at bedtime.    Marland Kitchen NOVOLOG FLEXPEN 100 UNIT/ML FlexPen Inject 5 Units into the skin 3 (three) times daily with meals.     Marland Kitchen omeprazole (PRILOSEC) 40 MG capsule Take 40 mg by mouth every morning.     No current facility-administered medications for this visit.     Allergies as of 02/23/2018  . (No Known Allergies)    Past Medical History:  Diagnosis Date  . Diabetes mellitus without complication ([place])   . Hypertension   . Prostate hypertrophy   . Pulmonary emboli ([place]) 2018  Past Surgical History:  Procedure Laterality Date  . INTRAMEDULLARY (IM) NAIL INTERTROCHANTERIC Right 12/06/2012   Procedure: INTRAMEDULLARY (IM) NAIL INTERTROCHANTRIC;  Surgeon: Carole Civil, MD;  Location: [facility];  Service: Orthopedics;  Laterality: Right;  after lunch   . KNEE SURGERY    . SHOULDER SURGERY      Family History  Problem Relation Age of Onset  . Heart attack Father   . Colon polyps Neg Hx   . Colon cancer Neg Hx     Social History   Socioeconomic History  . Marital status: Widowed    Spouse name: Not on file  . Number of children: Not on file  . Years of education: Not on file  . Highest education level:  Not on file  Occupational History  . Not on file  Social Needs  . Financial resource strain: Not on file  . Food insecurity:    Worry: Not on file    Inability: Not on file  . Transportation needs:    Medical: Not on file    Non-medical: Not on file  Tobacco Use  . Smoking status: Former Smoker    Packs/day: 1.00    Types: E-cigarettes  . Smokeless tobacco: Never Used  Substance and Sexual Activity  . Alcohol use: No  . Drug use: No  . Sexual activity: Not on file  Lifestyle  . Physical activity:    Days per week: Not on file    Minutes per session: Not on file  . Stress: Not on file  Relationships  . Social connections:    Talks on phone: Not on file    Gets together: Not on file    Attends religious service: Not on file    Active member of club or organization: Not on file    Attends meetings of clubs or organizations: Not on file    Relationship status: Not on file  . Intimate partner violence:    Fear of current or ex partner: Not on file    Emotionally abused: Not on file    Physically abused: Not on file    Forced sexual activity: Not on file  Other Topics Concern  . Not on file  Social History Narrative  . Not on file      ROS:  General: Negative for anorexia, weight loss, fever, chills, fatigue, weakness. Eyes: Negative for vision changes.  ENT: Negative for hoarseness, difficulty swallowing , nasal congestion. CV: Negative for chest pain, angina, palpitations, dyspnea on exertion, peripheral edema.  Respiratory: Negative for dyspnea at rest, dyspnea on exertion, cough, sputum, wheezing.  GI: See history of present illness. GU:  Negative for dysuria, hematuria, urinary incontinence, urinary frequency, nocturnal urination.  MS: Negative for joint pain, low back pain.  Derm: Negative for rash or itching.  Neuro: Negative for weakness, abnormal sensation, seizure, frequent headaches, memory loss, confusion.  Psych: Negative for anxiety, depression,  suicidal ideation, hallucinations.  Endo: Negative for unusual weight change.  Heme: Negative for bruising or bleeding. Allergy: Negative for rash or hives.    Physical Examination:  BP 121/80   Pulse 98   Temp (!) 96.9 F (36.1 C) (Oral)   Ht 5\' 11"  (1.803 m)   Wt 175 lb (79.4 kg)   BMI 24.41 kg/m    General: Well-nourished, well-developed in no acute distress.  Head: Normocephalic, atraumatic.   Eyes: Conjunctiva pink, no icterus. Mouth: Oropharyngeal mucosa moist and pink , no lesions erythema or exudate. Neck: Supple without thyromegaly,  masses, or lymphadenopathy.  Lungs: Clear to auscultation bilaterally.  Heart: Regular rate and rhythm, no murmurs rubs or gallops.  Abdomen: Bowel sounds are normal, nontender, nondistended, no hepatosplenomegaly or masses, no abdominal bruits or    hernia , no rebound or guarding.   Rectal: not performed Extremities: No lower extremity edema. No clubbing or deformities.  Neuro: Alert and oriented x 4 , grossly normal neurologically.  Skin: Warm and dry, no rash or jaundice.   Psych: Alert and cooperative, normal mood and affect.  Labs: Lab Results  Component Value Date   CREATININE 0.92 04/29/2017   BUN 26 (H) 04/29/2017   NA 136 04/29/2017   K 4.0 04/29/2017   CL 104 04/29/2017   CO2 26 04/29/2017   Lab Results  Component Value Date   ALT 15 (L) 04/27/2017   AST 16 04/27/2017   ALKPHOS 86 04/27/2017   BILITOT 1.1 04/27/2017   Lab Results  Component Value Date   WBC 14.7 (H) 04/29/2017   HGB 7.8 (L) 04/29/2017   HCT 24.7 (L) 04/29/2017   MCV 87.3 04/29/2017   PLT 290 04/29/2017   Lab Results  Component Value Date   IRON 11 (L) 04/26/2017   TIBC 214 (L) 04/26/2017   FERRITIN 154 04/26/2017   Lab Results  Component Value Date   VITAMINB12 236 04/26/2017   No results found for: FOLATE   Imaging Studies: No results found.

## 2018-02-23 NOTE — Patient Instructions (Signed)
1. We will work on setting up admission for bowel prep. We will be in touch. 2. I will request copy of latest anemia labs for review.

## 2018-02-26 NOTE — Assessment & Plan Note (Signed)
62 y/o male presenting for the third time since 2017 to try and get colonoscopy completed. He has h/o heme positive stool per [place] records. He has chronic anemia as outlined above but drop in Hgb by 2 grams back in 04/2017. [facility] and [facility]. He reports occasional black stools in setting of Eliquis (PE in 04/2017 after pelvic fracture and limited mobility).   He has well documented hypoglycemia while hospitalized previously and has had EMS activated at home due to being found down with hypoglycemia my family members.   It would be of best interest for patient to complete bowel prep during hospital observation given above reasons. We will make dose adjustments of Levemir starting day before admission during first day of clear liquids. Would consider colonoscopy and upper endoscopy given reported melena and anemia in setting of Eliquis.

## 2018-02-27 ENCOUNTER — Telehealth: Payer: Self-pay | Admitting: Gastroenterology

## 2018-02-27 NOTE — Telephone Encounter (Signed)
Please schedule patient for EGD/TCS with RMR with inpatient bowel prep (due to severe hypoglycemia with fasting/bowel prep before). Can be with or without propofol depending on schedule. Dx: heme + stool, anemia, melena.  1. Patient will need to start clear liquids after lunch the day before admission.  2. Starting two days before admission, he should decrease Levemir to 10 units at bedtime.  3. Starting one day before admission, he should decrease his Novolog to 2 units with meals.  4. He needs to hold Eliquis for 48 hours prior to procedure.   We will contact hospitalist two days before to ask about accepting for admission and contact bed control at that time.

## 2018-02-27 NOTE — Progress Notes (Addendum)
Received additional records: Labs from December 15, 2017, iron 56, ferritin 107  Labs from September 05, 2017, white blood cell count 9000, hemoglobin 10.3, hematocrit 32.5, MCV 88.1, platelets 356,000

## 2018-02-27 NOTE — Telephone Encounter (Signed)
Called pt, TCS/EGD w/Propofol w/RMR scheduled for 04/06/18 at 8:00am. He will be admitted 04/05/18 for bowel prep. He is aware insulin will be adjusted. Also aware to hold Eliquis for 48 hours prior to procedure. Advised him to arrive at [facility] by 8:00am 04/05/18. Instructions mailed. Endo scheduler aware of admission. Melanie at [facility] aware.

## 2018-04-04 NOTE — Telephone Encounter (Signed)
Called bed control and spoke to [place]. Information for observation given. Dawn advised the nurse will page Dr. Roderic Palau.

## 2018-04-04 NOTE — Telephone Encounter (Signed)
Spoke with Dr. Roderic Palau. OK to let bed control know that [facility] hospitalist will accept patient for 23 hour observation beginning 04/05/18 for bowel prep for colonoscopy. Dx: anemia, melena, heme + stool, diabetes.  Call [phone] with information please. This is number related to [facility] hospitalist admission. Not sure if you have to call bed control as well.

## 2018-04-04 NOTE — Telephone Encounter (Signed)
Daniel Campos at [facility] aware of observation for procedure.

## 2018-04-05 ENCOUNTER — Other Ambulatory Visit: Payer: Self-pay

## 2018-04-05 ENCOUNTER — Observation Stay (HOSPITAL_COMMUNITY)
Admission: RE | Admit: 2018-04-05 | Discharge: 2018-04-06 | Disposition: A | Discharge status: Home / Self Care | Payer: Medicare Other | Source: Ambulatory Visit | Attending: Internal Medicine | Admitting: Internal Medicine

## 2018-04-05 ENCOUNTER — Encounter (HOSPITAL_COMMUNITY): Payer: Self-pay

## 2018-04-05 DIAGNOSIS — D122 Benign neoplasm of ascending colon: Secondary | ICD-10-CM | POA: Insufficient documentation

## 2018-04-05 DIAGNOSIS — K2950 Unspecified chronic gastritis without bleeding: Secondary | ICD-10-CM | POA: Insufficient documentation

## 2018-04-05 DIAGNOSIS — Z7901 Long term (current) use of anticoagulants: Secondary | ICD-10-CM | POA: Insufficient documentation

## 2018-04-05 DIAGNOSIS — E119 Type 2 diabetes mellitus without complications: Secondary | ICD-10-CM | POA: Insufficient documentation

## 2018-04-05 DIAGNOSIS — K210 Gastro-esophageal reflux disease with esophagitis: Secondary | ICD-10-CM | POA: Diagnosis not present

## 2018-04-05 DIAGNOSIS — Z794 Long term (current) use of insulin: Secondary | ICD-10-CM | POA: Diagnosis not present

## 2018-04-05 DIAGNOSIS — Z86711 Personal history of pulmonary embolism: Secondary | ICD-10-CM | POA: Diagnosis not present

## 2018-04-05 DIAGNOSIS — K921 Melena: Secondary | ICD-10-CM | POA: Diagnosis not present

## 2018-04-05 DIAGNOSIS — R195 Other fecal abnormalities: Secondary | ICD-10-CM

## 2018-04-05 DIAGNOSIS — D124 Benign neoplasm of descending colon: Secondary | ICD-10-CM | POA: Insufficient documentation

## 2018-04-05 DIAGNOSIS — I2782 Chronic pulmonary embolism: Secondary | ICD-10-CM | POA: Diagnosis not present

## 2018-04-05 DIAGNOSIS — D649 Anemia, unspecified: Secondary | ICD-10-CM

## 2018-04-05 DIAGNOSIS — E11649 Type 2 diabetes mellitus with hypoglycemia without coma: Secondary | ICD-10-CM | POA: Diagnosis not present

## 2018-04-05 DIAGNOSIS — R197 Diarrhea, unspecified: Secondary | ICD-10-CM | POA: Insufficient documentation

## 2018-04-05 DIAGNOSIS — Z7982 Long term (current) use of aspirin: Secondary | ICD-10-CM | POA: Insufficient documentation

## 2018-04-05 DIAGNOSIS — Z8249 Family history of ischemic heart disease and other diseases of the circulatory system: Secondary | ICD-10-CM | POA: Diagnosis not present

## 2018-04-05 DIAGNOSIS — I2699 Other pulmonary embolism without acute cor pulmonale: Secondary | ICD-10-CM | POA: Diagnosis not present

## 2018-04-05 DIAGNOSIS — D120 Benign neoplasm of cecum: Secondary | ICD-10-CM | POA: Diagnosis not present

## 2018-04-05 DIAGNOSIS — R5383 Other fatigue: Secondary | ICD-10-CM | POA: Diagnosis not present

## 2018-04-05 DIAGNOSIS — E1151 Type 2 diabetes mellitus with diabetic peripheral angiopathy without gangrene: Secondary | ICD-10-CM

## 2018-04-05 DIAGNOSIS — I10 Essential (primary) hypertension: Secondary | ICD-10-CM | POA: Diagnosis not present

## 2018-04-05 DIAGNOSIS — Z79899 Other long term (current) drug therapy: Secondary | ICD-10-CM | POA: Diagnosis not present

## 2018-04-05 DIAGNOSIS — Z87891 Personal history of nicotine dependence: Secondary | ICD-10-CM | POA: Insufficient documentation

## 2018-04-05 LAB — BASIC METABOLIC PANEL
Anion gap: 6 (ref 5–15)
BUN: 21 mg/dL (ref 8–23)
CALCIUM: 8.4 mg/dL — AB (ref 8.9–10.3)
CO2: 25 mmol/L (ref 22–32)
Chloride: 106 mmol/L (ref 98–111)
Creatinine, Ser: 1.17 mg/dL (ref 0.61–1.24)
Glucose, Bld: 193 mg/dL — ABNORMAL HIGH (ref 70–99)
POTASSIUM: 4.4 mmol/L (ref 3.5–5.1)
Sodium: 137 mmol/L (ref 135–145)

## 2018-04-05 LAB — CBC
HCT: 30.1 % — ABNORMAL LOW (ref 39.0–52.0)
HEMOGLOBIN: 9.5 g/dL — AB (ref 13.0–17.0)
MCH: 28.1 pg (ref 26.0–34.0)
MCHC: 31.6 g/dL (ref 30.0–36.0)
MCV: 89.1 fL (ref 80.0–100.0)
PLATELETS: 285 10*3/uL (ref 150–400)
RBC: 3.38 MIL/uL — ABNORMAL LOW (ref 4.22–5.81)
RDW: 12.5 % (ref 11.5–15.5)
WBC: 8.2 10*3/uL (ref 4.0–10.5)
nRBC: 0 % (ref 0.0–0.2)

## 2018-04-05 LAB — GLUCOSE, CAPILLARY
GLUCOSE-CAPILLARY: 219 mg/dL — AB (ref 70–99)
GLUCOSE-CAPILLARY: 66 mg/dL — AB (ref 70–99)
GLUCOSE-CAPILLARY: 91 mg/dL (ref 70–99)
Glucose-Capillary: 280 mg/dL — ABNORMAL HIGH (ref 70–99)

## 2018-04-05 MED ORDER — INSULIN ASPART 100 UNIT/ML ~~LOC~~ SOLN: 0.0000 [IU] | Freq: Every day | SUBCUTANEOUS | Status: DC

## 2018-04-05 MED ORDER — ACETAMINOPHEN 325 MG PO TABS: 650.0000 mg | ORAL_TABLET | Freq: Four times a day (QID) | ORAL | Status: DC | PRN

## 2018-04-05 MED ORDER — INSULIN ASPART 100 UNIT/ML ~~LOC~~ SOLN
0.0000 [IU] | Freq: Three times a day (TID) | SUBCUTANEOUS | Status: DC
  Administered 2018-04-05: 8 [IU] via SUBCUTANEOUS

## 2018-04-05 MED ORDER — BISACODYL 5 MG PO TBEC
10.0000 mg | DELAYED_RELEASE_TABLET | Freq: Once | ORAL | Status: AC
  Administered 2018-04-05: 10 mg via ORAL
  Filled 2018-04-05: qty 2

## 2018-04-05 MED ORDER — HYDRALAZINE HCL 20 MG/ML IJ SOLN: 10.0000 mg | Freq: Four times a day (QID) | INTRAMUSCULAR | Status: DC | PRN

## 2018-04-05 MED ORDER — PANTOPRAZOLE SODIUM 40 MG PO TBEC
40.0000 mg | DELAYED_RELEASE_TABLET | Freq: Every day | ORAL | Status: DC
  Administered 2018-04-05: 40 mg via ORAL
  Filled 2018-04-05: qty 1

## 2018-04-05 MED ORDER — ACETAMINOPHEN 650 MG RE SUPP: 650.0000 mg | Freq: Four times a day (QID) | RECTAL | Status: DC | PRN

## 2018-04-05 MED ORDER — PEG 3350-KCL-NA BICARB-NACL 420 G PO SOLR
2000.0000 mL | Freq: Once | ORAL | Status: AC
  Administered 2018-04-05: 2000 mL via ORAL

## 2018-04-05 MED ORDER — PEG 3350-KCL-NA BICARB-NACL 420 G PO SOLR
2000.0000 mL | Freq: Once | ORAL | Status: AC
  Administered 2018-04-05: 2000 mL via ORAL
  Filled 2018-04-05: qty 4000

## 2018-04-05 MED ORDER — ONDANSETRON HCL 4 MG/2ML IJ SOLN
4.0000 mg | Freq: Four times a day (QID) | INTRAMUSCULAR | Status: DC | PRN
  Administered 2018-04-06: 4 mg via INTRAVENOUS
  Filled 2018-04-05: qty 2

## 2018-04-05 MED ORDER — ONDANSETRON HCL 4 MG PO TABS: 4.0000 mg | ORAL_TABLET | Freq: Four times a day (QID) | ORAL | Status: DC | PRN

## 2018-04-05 MED ORDER — CAPSAICIN 0.025 % EX CREA
1.0000 "application " | TOPICAL_CREAM | Freq: Three times a day (TID) | CUTANEOUS | Status: DC
  Administered 2018-04-05: 1 via TOPICAL
  Filled 2018-04-05: qty 60

## 2018-04-05 NOTE — Consult Note (Signed)
Referring Provider: Kathie Dike, MD Primary Care Physician:  [facility] Primary Gastroenterologist:  Garfield Cornea, MD  Reason for Consultation:  Inpatient bowel prep for colonoscopy, plans for EGD/colonoscopy for heme + stool, melena, anemia  HPI: Daniel Campos is a 62 y.o. male with history of Hemoccult positive stool, anemia, intermittent melena who presents to the hospital day for observation during bowel preparation.  We have tried to complete colonoscopy since December 2017.  He presented at that time for screening exam.  He had to cancel the procedure due to the flu.  We saw him back in April 2018 to reschedule colonoscopy.  He did a "mock trial of clear liquid diet, adjusting his Levemir and NovoLog by 50% as previously recommended but patient states he had several episodes of significant hypoglycemia during the night.  He reports having to activate EMS.  Due to this he has been scared to pursue bowel prep at home.  In December he was hospitalized with acute renal failure and pubic rami fracture due to fall.  He had an acute PE.  Even during this hospitalization he had 2 episodes of significant hypoglycemia, one at 748, result of 25 and another one at 8 AM with a glucose of 51.  When I saw him last he had reported occasional black stools, the last time 1 week prior to visit.  No fresh blood per rectum.  Complaining of fatigue.  No chest pain or shortness of breath.  No heartburn, dysphagia, abdominal pain.  He states his bowel movements are regular but occasionally has diarrhea which he has had this for years.  He states he has had some anemia, found through the [place] but never required blood transfusions.  Takes iron pill every day. Recent hyperkalemia requiring Kayexalate.     Prior to Admission medications   Medication Sig Start Date End Date Taking? Authorizing Provider  apixaban (ELIQUIS) 5 MG TABS tablet Take 1 tablet (5 mg total) by mouth 2 (two) times daily.  05/05/17   [name], [name], MD  aspirin 81 MG tablet Take 81 mg by mouth daily.    [provider]  B Complex-C (B-COMPLEX WITH VITAMIN C) tablet Take 1 tablet by mouth daily.    [provider]  capsaicin (ZOSTRIX) 0.025 % cream Apply 1 application topically 3 (three) times daily.    [provider]  Cholecalciferol (VITAMIN D PO) Take 1 tablet by mouth daily.    [provider]  insulin detemir (LEVEMIR) 100 UNIT/ML injection Inject 0.2 mLs (20 Units total) into the skin at bedtime. Patient taking differently: Inject 30 Units into the skin at bedtime.  04/29/17   [name], [name], MD  Melatonin 10 MG TABS Take 10 mg by mouth at bedtime.    [provider]  NOVOLOG FLEXPEN 100 UNIT/ML FlexPen Inject 5 Units into the skin 3 (three) times daily with meals.  08/26/13   [provider]  omeprazole (PRILOSEC) 40 MG capsule Take 40 mg by mouth every morning.    [provider]    Current Facility-Administered Medications  Medication Dose Route Frequency Provider Last Rate Last Dose  . bisacodyl (DULCOLAX) EC tablet 10 mg  10 mg Oral Once Mahala Menghini, PA-C      . polyethylene glycol-electrolytes (NuLYTELY/GoLYTELY) solution 2,000 mL  2,000 mL Oral Once Mahala Menghini, PA-C        Allergies as of 04/04/2018  . (No Known Allergies)    Past  Medical History:  Diagnosis Date  . Diabetes mellitus without complication ([place])   . Hypertension   . Prostate hypertrophy   . Pulmonary emboli ([place]) 2018    Past Surgical History:  Procedure Laterality Date  . INTRAMEDULLARY (IM) NAIL INTERTROCHANTERIC Right 12/06/2012   Procedure: INTRAMEDULLARY (IM) NAIL INTERTROCHANTRIC;  Surgeon: Carole Civil, MD;  Location: [facility];  Service: Orthopedics;  Laterality: Right;  after lunch   . KNEE SURGERY    . SHOULDER SURGERY      Family History  Problem Relation Age of Onset  . Heart attack Father   . Colon polyps Neg  Hx   . Colon cancer Neg Hx     Social History   Socioeconomic History  . Marital status: Widowed    Spouse name: Not on file  . Number of children: Not on file  . Years of education: Not on file  . Highest education level: Not on file  Occupational History  . Not on file  Social Needs  . Financial resource strain: Not on file  . Food insecurity:    Worry: Not on file    Inability: Not on file  . Transportation needs:    Medical: Not on file    Non-medical: Not on file  Tobacco Use  . Smoking status: Former Smoker    Packs/day: 1.00    Types: E-cigarettes  . Smokeless tobacco: Never Used  Substance and Sexual Activity  . Alcohol use: No  . Drug use: No  . Sexual activity: Not on file  Lifestyle  . Physical activity:    Days per week: Not on file    Minutes per session: Not on file  . Stress: Not on file  Relationships  . Social connections:    Talks on phone: Not on file    Gets together: Not on file    Attends religious service: Not on file    Active member of club or organization: Not on file    Attends meetings of clubs or organizations: Not on file    Relationship status: Not on file  . Intimate partner violence:    Fear of current or ex partner: Not on file    Emotionally abused: Not on file    Physically abused: Not on file    Forced sexual activity: Not on file  Other Topics Concern  . Not on file  Social History Narrative  . Not on file     ROS:  General: Negative for anorexia, weight loss, fever, chills, fatigue, weakness. Eyes: Negative for vision changes.  ENT: Negative for hoarseness, difficulty swallowing , nasal congestion. CV: Negative for chest pain, angina, palpitations, dyspnea on exertion, peripheral edema.  Respiratory: Negative for dyspnea at rest, dyspnea on exertion, cough, sputum, wheezing.  GI: See history of present illness. GU:  Negative for dysuria, hematuria, urinary incontinence, urinary frequency, nocturnal urination.  MS:  Negative for joint pain, low back pain.  Derm: Negative for rash or itching.  Neuro: Negative for weakness, abnormal sensation, seizure, frequent headaches, memory loss, confusion.  Psych: Negative for anxiety, depression, suicidal ideation, hallucinations.  Endo: Negative for unusual weight change.  Heme: Negative for bruising or bleeding. Allergy: Negative for rash or hives.       Physical Examination: Vital signs in last 24 hours: Temp:  [97.5 F (36.4 C)] 97.5 F (36.4 C) (11/20 0907) Pulse Rate:  [86] 86 (11/20 0907) Resp:  [20] 20 (11/20 0907) BP: (139)/(85) 139/85 (11/20 0907) SpO2:  [100 %]  100 % (11/20 0907) Weight:  [78.9 kg] 78.9 kg (11/20 1000)    General: Well-nourished, well-developed in no acute distress.  Head: Normocephalic, atraumatic.   Eyes: Conjunctiva pink, no icterus. Mouth: Oropharyngeal mucosa moist and pink , no lesions erythema or exudate. Neck: Supple without thyromegaly, masses, or lymphadenopathy.  Lungs: Clear to auscultation bilaterally.  Heart: Regular rate and rhythm, no murmurs rubs or gallops.  Abdomen: Bowel sounds are normal, nontender, nondistended, no hepatosplenomegaly or masses, no abdominal bruits or    hernia , no rebound or guarding.   Rectal: not performed Extremities: No lower extremity edema, clubbing, deformity.  Neuro: Alert and oriented x 4 , grossly normal neurologically.  Skin: Warm and dry, no rash or jaundice.   Psych: Alert and cooperative, normal mood and affect.        Intake/Output from previous day: No intake/output data recorded. Intake/Output this shift: No intake/output data recorded.  Lab Results: CBC Recent Labs    04/05/18 1015  WBC 8.2  HGB 9.5*  HCT 30.1*  MCV 89.1  PLT 285   BMET Recent Labs    04/05/18 1015  NA 137  K 4.4  CL 106  CO2 25  GLUCOSE 193*  BUN 21  CREATININE 1.17  CALCIUM 8.4*   LFT No results for input(s): BILITOT, BILIDIR, IBILI, ALKPHOS, AST, ALT, PROT, ALBUMIN in the  last 72 hours.  Lipase No results for input(s): LIPASE in the last 72 hours.  PT/INR No results for input(s): LABPROT, INR in the last 72 hours.    Imaging Studies: No results found.Minnie.[place] week]   Impression: 62 y/o male with history of pulmonary emboli on Eliquis, diabetes mellitus who presents for observation during bowel preparation for colonoscopy and upper endoscopy.  Patient has had problems with significant hypoglycemia even with dose adjustment of his diabetic medications during a clear liquid trial for previous colonoscopy.  Well-documented significant hypoglycemia during hospitalizations as outlined above.  It was felt best to bring him into the hospital during his bowel prep.  Colonoscopy planned for anemia, heme positive stool.  Also planning for upper endoscopy due to history of intermittent melena.  His Eliquis has been on hold for 24 hours at this point.  He reported recent hyperkalemia.  Update labs today showed normal potassium status post Kayexalate recently..  Plan: 1. Bowel preparation today. 2. Colonoscopy with EGD tomorrow with propofol.  I have discussed the risks, alternatives, benefits with regards to but not limited to the risk of reaction to medication, bleeding, infection, perforation and the patient is agreeable to proceed. Written consent to be obtained. 3. Last dose of Eliquis was 04/04/2018, 7AM.  Continue to hold till after procedure.  Laureen Ochs. Bernarda Caffey [facility] [phone] 11/20/20191:29 PM     LOS: 0 days

## 2018-04-05 NOTE — H&P (View-Only) (Signed)
Referring Provider: Kathie Dike, MD Primary Care Physician:  [facility] [place] Primary Gastroenterologist:  Garfield Cornea, MD  Reason for Consultation:  Inpatient bowel prep for colonoscopy, plans for EGD/colonoscopy for heme + stool, melena, anemia  HPI: Daniel Campos is a 62 y.o. male with history of Hemoccult positive stool, anemia, intermittent melena who presents to the hospital day for observation during bowel preparation.  We have tried to complete colonoscopy since December 2017.  He presented at that time for screening exam.  He had to cancel the procedure due to the flu.  We saw him back in April 2018 to reschedule colonoscopy.  He did a "mock trial of clear liquid diet, adjusting his Levemir and NovoLog by 50% as previously recommended but patient states he had several episodes of significant hypoglycemia during the night.  He reports having to activate EMS.  Due to this he has been scared to pursue bowel prep at home.  In December he was hospitalized with acute renal failure and pubic rami fracture due to fall.  He had an acute PE.  Even during this hospitalization he had 2 episodes of significant hypoglycemia, one at 748, result of 25 and another one at 8 AM with a glucose of 51.  When I saw him last he had reported occasional black stools, the last time 1 week prior to visit.  No fresh blood per rectum.  Complaining of fatigue.  No chest pain or shortness of breath.  No heartburn, dysphagia, abdominal pain.  He states his bowel movements are regular but occasionally has diarrhea which he has had this for years.  He states he has had some anemia, found through the [place] but never required blood transfusions.  Takes iron pill every day. Recent hyperkalemia requiring Kayexalate.     Prior to Admission medications   Medication Sig Start Date End Date Taking? Authorizing Provider  apixaban (ELIQUIS) 5 MG TABS tablet Take 1 tablet (5 mg total) by mouth 2 (two) times daily.  05/05/17   [name], [name], MD  aspirin 81 MG tablet Take 81 mg by mouth daily.    [provider]  B Complex-C (B-COMPLEX WITH VITAMIN C) tablet Take 1 tablet by mouth daily.    [provider]  capsaicin (ZOSTRIX) 0.025 % cream Apply 1 application topically 3 (three) times daily.    [provider]  Cholecalciferol (VITAMIN D PO) Take 1 tablet by mouth daily.    [provider]  insulin detemir (LEVEMIR) 100 UNIT/ML injection Inject 0.2 mLs (20 Units total) into the skin at bedtime. Patient taking differently: Inject 30 Units into the skin at bedtime.  04/29/17   [name], [name], MD  Melatonin 10 MG TABS Take 10 mg by mouth at bedtime.    [provider]  NOVOLOG FLEXPEN 100 UNIT/ML FlexPen Inject 5 Units into the skin 3 (three) times daily with meals.  08/26/13   [provider]  omeprazole (PRILOSEC) 40 MG capsule Take 40 mg by mouth every morning.    [provider]    Current Facility-Administered Medications  Medication Dose Route Frequency Provider Last Rate Last Dose  . bisacodyl (DULCOLAX) EC tablet 10 mg  10 mg Oral Once Mahala Menghini, PA-C      . polyethylene glycol-electrolytes (NuLYTELY/GoLYTELY) solution 2,000 mL  2,000 mL Oral Once Mahala Menghini, PA-C        Allergies as of 04/04/2018  . (No Known Allergies)    Past  Medical History:  Diagnosis Date  . Diabetes mellitus without complication ([place])   . Hypertension   . Prostate hypertrophy   . Pulmonary emboli ([place]) 2018    Past Surgical History:  Procedure Laterality Date  . INTRAMEDULLARY (IM) NAIL INTERTROCHANTERIC Right 12/06/2012   Procedure: INTRAMEDULLARY (IM) NAIL INTERTROCHANTRIC;  Surgeon: Carole Civil, MD;  Location: [facility];  Service: Orthopedics;  Laterality: Right;  after lunch   . KNEE SURGERY    . SHOULDER SURGERY      Family History  Problem Relation Age of Onset  . Heart attack Father   . Colon polyps Neg  Hx   . Colon cancer Neg Hx     Social History   Socioeconomic History  . Marital status: Widowed    Spouse name: Not on file  . Number of children: Not on file  . Years of education: Not on file  . Highest education level: Not on file  Occupational History  . Not on file  Social Needs  . Financial resource strain: Not on file  . Food insecurity:    Worry: Not on file    Inability: Not on file  . Transportation needs:    Medical: Not on file    Non-medical: Not on file  Tobacco Use  . Smoking status: Former Smoker    Packs/day: 1.00    Types: E-cigarettes  . Smokeless tobacco: Never Used  Substance and Sexual Activity  . Alcohol use: No  . Drug use: No  . Sexual activity: Not on file  Lifestyle  . Physical activity:    Days per week: Not on file    Minutes per session: Not on file  . Stress: Not on file  Relationships  . Social connections:    Talks on phone: Not on file    Gets together: Not on file    Attends religious service: Not on file    Active member of club or organization: Not on file    Attends meetings of clubs or organizations: Not on file    Relationship status: Not on file  . Intimate partner violence:    Fear of current or ex partner: Not on file    Emotionally abused: Not on file    Physically abused: Not on file    Forced sexual activity: Not on file  Other Topics Concern  . Not on file  Social History Narrative  . Not on file     ROS:  General: Negative for anorexia, weight loss, fever, chills, fatigue, weakness. Eyes: Negative for vision changes.  ENT: Negative for hoarseness, difficulty swallowing , nasal congestion. CV: Negative for chest pain, angina, palpitations, dyspnea on exertion, peripheral edema.  Respiratory: Negative for dyspnea at rest, dyspnea on exertion, cough, sputum, wheezing.  GI: See history of present illness. GU:  Negative for dysuria, hematuria, urinary incontinence, urinary frequency, nocturnal urination.  MS:  Negative for joint pain, low back pain.  Derm: Negative for rash or itching.  Neuro: Negative for weakness, abnormal sensation, seizure, frequent headaches, memory loss, confusion.  Psych: Negative for anxiety, depression, suicidal ideation, hallucinations.  Endo: Negative for unusual weight change.  Heme: Negative for bruising or bleeding. Allergy: Negative for rash or hives.       Physical Examination: Vital signs in last 24 hours: Temp:  [97.5 F (36.4 C)] 97.5 F (36.4 C) (11/20 0907) Pulse Rate:  [86] 86 (11/20 0907) Resp:  [20] 20 (11/20 0907) BP: (139)/(85) 139/85 (11/20 0907) SpO2:  [100 %]  100 % (11/20 0907) Weight:  [78.9 kg] 78.9 kg (11/20 1000)    General: Well-nourished, well-developed in no acute distress.  Head: Normocephalic, atraumatic.   Eyes: Conjunctiva pink, no icterus. Mouth: Oropharyngeal mucosa moist and pink , no lesions erythema or exudate. Neck: Supple without thyromegaly, masses, or lymphadenopathy.  Lungs: Clear to auscultation bilaterally.  Heart: Regular rate and rhythm, no murmurs rubs or gallops.  Abdomen: Bowel sounds are normal, nontender, nondistended, no hepatosplenomegaly or masses, no abdominal bruits or    hernia , no rebound or guarding.   Rectal: not performed Extremities: No lower extremity edema, clubbing, deformity.  Neuro: Alert and oriented x 4 , grossly normal neurologically.  Skin: Warm and dry, no rash or jaundice.   Psych: Alert and cooperative, normal mood and affect.        Intake/Output from previous day: No intake/output data recorded. Intake/Output this shift: No intake/output data recorded.  Lab Results: CBC Recent Labs    04/05/18 1015  WBC 8.2  HGB 9.5*  HCT 30.1*  MCV 89.1  PLT 285   BMET Recent Labs    04/05/18 1015  NA 137  K 4.4  CL 106  CO2 25  GLUCOSE 193*  BUN 21  CREATININE 1.17  CALCIUM 8.4*   LFT No results for input(s): BILITOT, BILIDIR, IBILI, ALKPHOS, AST, ALT, PROT, ALBUMIN in the  last 72 hours.  Lipase No results for input(s): LIPASE in the last 72 hours.  PT/INR No results for input(s): LABPROT, INR in the last 72 hours.    Imaging Studies: No results found.Minnie.[place] week]   Impression: 62 y/o male with history of pulmonary emboli on Eliquis, diabetes mellitus who presents for observation during bowel preparation for colonoscopy and upper endoscopy.  Patient has had problems with significant hypoglycemia even with dose adjustment of his diabetic medications during a clear liquid trial for previous colonoscopy.  Well-documented significant hypoglycemia during hospitalizations as outlined above.  It was felt best to bring him into the hospital during his bowel prep.  Colonoscopy planned for anemia, heme positive stool.  Also planning for upper endoscopy due to history of intermittent melena.  His Eliquis has been on hold for 24 hours at this point.  He reported recent hyperkalemia.  Update labs today showed normal potassium status post Kayexalate recently..  Plan: 1. Bowel preparation today. 2. Colonoscopy with EGD tomorrow with propofol.  I have discussed the risks, alternatives, benefits with regards to but not limited to the risk of reaction to medication, bleeding, infection, perforation and the patient is agreeable to proceed. Written consent to be obtained. 3. Last dose of Eliquis was 04/04/2018, 7AM.  Continue to hold till after procedure.  Laureen Ochs. Bernarda Caffey [facility] [phone] 11/20/20191:29 PM     LOS: 0 days

## 2018-04-05 NOTE — Progress Notes (Signed)
Pt started first 2,000 mL of prep, explained it's important to drink it all within a 2 hour time span. Will continue to monitor.

## 2018-04-05 NOTE — H&P (Signed)
History and Physical    Daniel Campos LYY:[id number] DOB: 1955-11-25 DOA: 04/05/2018  PCP: [name]  Patient coming from: home  I have personally briefly reviewed patient's old medical records in [place]  Chief Complaint: melena  HPI: Daniel Campos is a 62 y.o. male with medical history significant of diabetes, hypertension who is directly admitted to the hospital to undergo bowel prep for colonoscopy.  Patient has been having anemia, heme positive stools and melena intermittently for quite some time now.  Previous attempts have been made to undergo outpatient colonoscopy/EGD, but patient has had complications with bowel prep.  He is a diabetic and but often become significantly hypoglycemic in the morning while being n.p.o.  Other bowel prep attempts have been complicated by development of acute renal failure and pubic rami fracture which required hospitalization.  At this time, patient is feeling well, denies any lightheadedness or dizziness.  No abdominal pain.  Review of Systems: As per HPI otherwise 10 point review of systems negative.    Past Medical History:  Diagnosis Date  . Diabetes mellitus without complication ([place])   . Hypertension   . Prostate hypertrophy   . Pulmonary emboli ([place]) 2018    Past Surgical History:  Procedure Laterality Date  . INTRAMEDULLARY (IM) NAIL INTERTROCHANTERIC Right 12/06/2012   Procedure: INTRAMEDULLARY (IM) NAIL INTERTROCHANTRIC;  Surgeon: Carole Civil, MD;  Location: [facility];  Service: Orthopedics;  Laterality: Right;  after lunch   . KNEE SURGERY    . SHOULDER SURGERY      Social History:  reports that he has quit smoking. His smoking use included e-cigarettes. He smoked 1.00 pack per day. He has never used smokeless tobacco. He reports that he does not drink alcohol or use drugs.  No Known Allergies  Family History  Problem Relation Age of Onset  . Heart attack Father   . Colon polyps Neg Hx   .  Colon cancer Neg Hx     Prior to Admission medications   Medication Sig Start Date End Date Taking? Authorizing Provider  apixaban (ELIQUIS) 5 MG TABS tablet Take 1 tablet (5 mg total) by mouth 2 (two) times daily. 05/05/17   [name], [name], MD  aspirin 81 MG tablet Take 81 mg by mouth daily.    [provider]  B Complex-C (B-COMPLEX WITH VITAMIN C) tablet Take 1 tablet by mouth daily.    [provider]  capsaicin (ZOSTRIX) 0.025 % cream Apply 1 application topically 3 (three) times daily.    [provider]  Cholecalciferol (VITAMIN D PO) Take 1 tablet by mouth daily.    [provider]  insulin detemir (LEVEMIR) 100 UNIT/ML injection Inject 0.2 mLs (20 Units total) into the skin at bedtime. Patient taking differently: Inject 30 Units into the skin at bedtime.  04/29/17   [name], [name], MD  Melatonin 10 MG TABS Take 10 mg by mouth at bedtime.    [provider]  NOVOLOG FLEXPEN 100 UNIT/ML FlexPen Inject 5 Units into the skin 3 (three) times daily with meals.  08/26/13   [provider]  omeprazole (PRILOSEC) 40 MG capsule Take 40 mg by mouth every morning.    [provider]    Physical Exam: Vitals:   04/05/18 0907 04/05/18 1000 04/05/18 1304 04/05/18 1532  BP: 139/85  (!) 187/90 (!) 179/93  Pulse: 86  90 85  Resp: 20   20  Temp: (!) 97.5 F (  36.4 C)  97.6 F (36.4 C) 98.4 F (36.9 C)  TempSrc: Oral  Oral   SpO2: 100%  100% 99%  Weight:  78.9 kg    Height:  5\' 11"  (1.803 m)      Constitutional: NAD, calm, comfortable Eyes: PERRL, lids and conjunctivae normal ENMT: Mucous membranes are moist. Posterior pharynx clear of any exudate or lesions.Normal dentition.  Neck: normal, supple, no masses, no thyromegaly Respiratory: clear to auscultation bilaterally, no wheezing, no crackles. Normal respiratory effort. No accessory muscle use.  Cardiovascular: Regular rate and rhythm, no murmurs / rubs  / gallops. No extremity edema. 2+ pedal pulses. No carotid bruits.  Abdomen: no tenderness, no masses palpated. No hepatosplenomegaly. Bowel sounds positive.  Musculoskeletal: no clubbing / cyanosis. No joint deformity upper and lower extremities. Good ROM, no contractures. Normal muscle tone.  Skin: no rashes, lesions, ulcers. No induration Neurologic: CN 2-12 grossly intact. Sensation intact, DTR normal. Strength 5/5 in all 4.  Psychiatric: Normal judgment and insight. Alert and oriented x 3. Normal mood.    Labs on Admission: I have personally reviewed following labs and imaging studies  CBC: Recent Labs  Lab 04/05/18 1015  WBC 8.2  HGB 9.5*  HCT 30.1*  MCV 89.1  PLT 607   Basic Metabolic Panel: Recent Labs  Lab 04/05/18 1015  NA 137  K 4.4  CL 106  CO2 25  GLUCOSE 193*  BUN 21  CREATININE 1.17  CALCIUM 8.4*   GFR: Estimated Creatinine Clearance: 68.8 mL/min (by C-G formula based on SCr of 1.17 mg/dL). Liver Function Tests: No results for input(s): AST, ALT, ALKPHOS, BILITOT, PROT, ALBUMIN in the last 168 hours. No results for input(s): LIPASE, AMYLASE in the last 168 hours. No results for input(s): AMMONIA in the last 168 hours. Coagulation Profile: No results for input(s): INR, PROTIME in the last 168 hours. Cardiac Enzymes: No results for input(s): CKTOTAL, CKMB, CKMBINDEX, TROPONINI in the last 168 hours. BNP (last 3 results) No results for input(s): PROBNP in the last 8760 hours. HbA1C: No results for input(s): HGBA1C in the last 72 hours. CBG: Recent Labs  Lab 04/05/18 1138  GLUCAP 219*   Lipid Profile: No results for input(s): CHOL, HDL, LDLCALC, TRIG, CHOLHDL, LDLDIRECT in the last 72 hours. Thyroid Function Tests: No results for input(s): TSH, T4TOTAL, FREET4, T3FREE, THYROIDAB in the last 72 hours. Anemia Panel: No results for input(s): VITAMINB12, FOLATE, FERRITIN, TIBC, IRON, RETICCTPCT in the last 72 hours. Urine analysis:    Component  Value Date/Time   COLORURINE YELLOW 04/26/2017 [place] 04/26/2017 1352   LABSPEC 1.015 04/26/2017 1352   PHURINE 5.0 04/26/2017 1352   GLUCOSEU 150 (A) 04/26/2017 1352   HGBUR SMALL (A) 04/26/2017 1352   BILIRUBINUR NEGATIVE 04/26/2017 1352   KETONESUR NEGATIVE 04/26/2017 1352   PROTEINUR NEGATIVE 04/26/2017 1352   UROBILINOGEN 0.2 11/16/2010 1144   NITRITE NEGATIVE 04/26/2017 1352   LEUKOCYTESUR NEGATIVE 04/26/2017 1352    Radiological Exams on Admission: No results found.  Assessment/Plan Active Problems:   Diabetes ([place])   Essential hypertension, benign   Pulmonary embolism ([facility])   Melena     1. Intermittent melena with heme positive stools.  Admitted for bowel prep.  Plans are for colonoscopy and EGD in a.m.  Currently undergoing bowel prep. 2. Anemia, likely related to chronic blood loss.  Hemoglobin is currently stable. 3. Diabetes.  Hold basal insulin for now since he has been having hypoglycemic episodes at home.  Continue  on sliding scale insulin for now. 4. History of pulmonary embolism.  He is chronically on Eliquis.  This is been held for procedure. 5. Hypertension.  Continue on hydralazine as needed  DVT prophylaxis: SCDs Code Status: full code  Family Communication: no family present  Disposition Plan: discharge home after procedures complete  Consults called: GI  Admission status: observation, medsurg   Kathie Dike MD [facility] Pager [phone]  If 7PM-7AM, please contact night-coverage [email or web address] Password [email or web address]  04/05/2018, 5:08 PM

## 2018-04-06 ENCOUNTER — Encounter (HOSPITAL_COMMUNITY): Payer: Self-pay

## 2018-04-06 ENCOUNTER — Encounter (HOSPITAL_COMMUNITY)
Admission: RE | Disposition: A | Discharge status: Home / Self Care | Payer: Self-pay | Source: Ambulatory Visit | Attending: Internal Medicine

## 2018-04-06 ENCOUNTER — Observation Stay (HOSPITAL_COMMUNITY): Payer: Medicare Other | Admitting: Anesthesiology

## 2018-04-06 DIAGNOSIS — K210 Gastro-esophageal reflux disease with esophagitis: Secondary | ICD-10-CM | POA: Diagnosis not present

## 2018-04-06 DIAGNOSIS — D120 Benign neoplasm of cecum: Secondary | ICD-10-CM | POA: Diagnosis not present

## 2018-04-06 DIAGNOSIS — I2782 Chronic pulmonary embolism: Secondary | ICD-10-CM | POA: Diagnosis not present

## 2018-04-06 DIAGNOSIS — I10 Essential (primary) hypertension: Secondary | ICD-10-CM | POA: Diagnosis not present

## 2018-04-06 DIAGNOSIS — K3189 Other diseases of stomach and duodenum: Secondary | ICD-10-CM | POA: Diagnosis not present

## 2018-04-06 DIAGNOSIS — E119 Type 2 diabetes mellitus without complications: Secondary | ICD-10-CM | POA: Diagnosis not present

## 2018-04-06 DIAGNOSIS — D649 Anemia, unspecified: Secondary | ICD-10-CM | POA: Diagnosis not present

## 2018-04-06 DIAGNOSIS — D124 Benign neoplasm of descending colon: Secondary | ICD-10-CM | POA: Diagnosis not present

## 2018-04-06 DIAGNOSIS — D122 Benign neoplasm of ascending colon: Secondary | ICD-10-CM | POA: Diagnosis not present

## 2018-04-06 DIAGNOSIS — Z794 Long term (current) use of insulin: Secondary | ICD-10-CM | POA: Diagnosis not present

## 2018-04-06 DIAGNOSIS — K921 Melena: Secondary | ICD-10-CM | POA: Diagnosis not present

## 2018-04-06 DIAGNOSIS — K2950 Unspecified chronic gastritis without bleeding: Secondary | ICD-10-CM | POA: Diagnosis not present

## 2018-04-06 DIAGNOSIS — E11649 Type 2 diabetes mellitus with hypoglycemia without coma: Secondary | ICD-10-CM | POA: Diagnosis not present

## 2018-04-06 DIAGNOSIS — Z7901 Long term (current) use of anticoagulants: Secondary | ICD-10-CM | POA: Diagnosis not present

## 2018-04-06 DIAGNOSIS — I2699 Other pulmonary embolism without acute cor pulmonale: Secondary | ICD-10-CM | POA: Diagnosis not present

## 2018-04-06 DIAGNOSIS — K209 Esophagitis, unspecified: Secondary | ICD-10-CM | POA: Diagnosis not present

## 2018-04-06 HISTORY — PX: BIOPSY: SHX5522

## 2018-04-06 HISTORY — PX: POLYPECTOMY: SHX5525

## 2018-04-06 HISTORY — PX: COLONOSCOPY WITH PROPOFOL: SHX5780

## 2018-04-06 HISTORY — PX: ESOPHAGOGASTRODUODENOSCOPY (EGD) WITH PROPOFOL: SHX5813

## 2018-04-06 LAB — BASIC METABOLIC PANEL
ANION GAP: 9 (ref 5–15)
BUN: 17 mg/dL (ref 8–23)
CO2: 23 mmol/L (ref 22–32)
Calcium: 8.6 mg/dL — ABNORMAL LOW (ref 8.9–10.3)
Chloride: 106 mmol/L (ref 98–111)
Creatinine, Ser: 1.13 mg/dL (ref 0.61–1.24)
GFR calc Af Amer: >60 mL/min (ref >60)
GFR calc non Af Amer: >60 mL/min (ref >60)
GLUCOSE: 352 mg/dL — AB (ref 70–99)
Potassium: 4.3 mmol/L (ref 3.5–5.1)
Sodium: 138 mmol/L (ref 135–145)

## 2018-04-06 LAB — CBC
HCT: 31.8 % — ABNORMAL LOW (ref 39.0–52.0)
HEMOGLOBIN: 10 g/dL — AB (ref 13.0–17.0)
MCH: 28.2 pg (ref 26.0–34.0)
MCHC: 31.4 g/dL (ref 30.0–36.0)
MCV: 89.6 fL (ref 80.0–100.0)
Platelets: 299 10*3/uL (ref 150–400)
RBC: 3.55 MIL/uL — AB (ref 4.22–5.81)
RDW: 12.1 % (ref 11.5–15.5)
WBC: 7.9 10*3/uL (ref 4.0–10.5)
nRBC: 0 % (ref 0.0–0.2)

## 2018-04-06 LAB — GLUCOSE, CAPILLARY
GLUCOSE-CAPILLARY: 272 mg/dL — AB (ref 70–99)
Glucose-Capillary: 333 mg/dL — ABNORMAL HIGH (ref 70–99)
Glucose-Capillary: 337 mg/dL — ABNORMAL HIGH (ref 70–99)
Glucose-Capillary: 248 mg/dL — ABNORMAL HIGH (ref 70–99)

## 2018-04-06 SURGERY — COLONOSCOPY WITH PROPOFOL
Anesthesia: General

## 2018-04-06 MED ORDER — INSULIN ASPART 100 UNIT/ML ~~LOC~~ SOLN: 0.0000 [IU] | Freq: Three times a day (TID) | SUBCUTANEOUS | Status: DC

## 2018-04-06 MED ORDER — HYDROMORPHONE HCL 1 MG/ML IJ SOLN: 0.2500 mg | INTRAMUSCULAR | Status: DC | PRN

## 2018-04-06 MED ORDER — PROPOFOL 10 MG/ML IV BOLUS
INTRAVENOUS | Status: DC | PRN
  Administered 2018-04-06 (×5): 20 mg via INTRAVENOUS
  Administered 2018-04-06: 15 mg via INTRAVENOUS

## 2018-04-06 MED ORDER — APIXABAN 5 MG PO TABS: 5.0000 mg | ORAL_TABLET | Freq: Two times a day (BID) | ORAL | 2 refills | Status: DC

## 2018-04-06 MED ORDER — INSULIN ASPART 100 UNIT/ML ~~LOC~~ SOLN
5.0000 [IU] | Freq: Once | SUBCUTANEOUS | Status: AC
  Administered 2018-04-06: 5 [IU] via SUBCUTANEOUS

## 2018-04-06 MED ORDER — PROPOFOL 10 MG/ML IV BOLUS
INTRAVENOUS | Status: AC
  Filled 2018-04-06: qty 40

## 2018-04-06 MED ORDER — LACTATED RINGERS IV SOLN
INTRAVENOUS | Status: DC
  Administered 2018-04-06 (×2): via INTRAVENOUS

## 2018-04-06 MED ORDER — PHENYLEPHRINE 40 MCG/ML (10ML) SYRINGE FOR IV PUSH (FOR BLOOD PRESSURE SUPPORT)
PREFILLED_SYRINGE | INTRAVENOUS | Status: AC
  Filled 2018-04-06: qty 20

## 2018-04-06 MED ORDER — ASPIRIN 81 MG PO TABS: 81.0000 mg | ORAL_TABLET | Freq: Every day | ORAL | Status: AC

## 2018-04-06 MED ORDER — PROPOFOL 500 MG/50ML IV EMUL
INTRAVENOUS | Status: DC | PRN
  Administered 2018-04-06: 09:00:00 via INTRAVENOUS
  Administered 2018-04-06: 150 ug/kg/min via INTRAVENOUS

## 2018-04-06 MED ORDER — PROMETHAZINE HCL 25 MG/ML IJ SOLN: 6.2500 mg | INTRAMUSCULAR | Status: DC | PRN

## 2018-04-06 MED ORDER — MIDAZOLAM HCL 2 MG/2ML IJ SOLN: 0.5000 mg | Freq: Once | INTRAMUSCULAR | Status: DC | PRN

## 2018-04-06 MED ORDER — INSULIN DETEMIR 100 UNIT/ML ~~LOC~~ SOLN
20.0000 [IU] | Freq: Every day | SUBCUTANEOUS | Status: DC
  Administered 2018-04-06: 20 [IU] via SUBCUTANEOUS
  Filled 2018-04-06 (×2): qty 0.2

## 2018-04-06 MED ORDER — STERILE WATER FOR IRRIGATION IR SOLN
Status: DC | PRN
  Administered 2018-04-06: 09:00:00

## 2018-04-06 MED ORDER — HYDROCODONE-ACETAMINOPHEN 7.5-325 MG PO TABS: 1.0000 | ORAL_TABLET | Freq: Once | ORAL | Status: DC | PRN

## 2018-04-06 MED ORDER — INSULIN ASPART 100 UNIT/ML ~~LOC~~ SOLN: 0.0000 [IU] | Freq: Every day | SUBCUTANEOUS | Status: DC

## 2018-04-06 MED ORDER — LABETALOL HCL 5 MG/ML IV SOLN
10.0000 mg | Freq: Once | INTRAVENOUS | Status: AC
  Administered 2018-04-06: 10 mg via INTRAVENOUS

## 2018-04-06 MED ORDER — LABETALOL HCL 5 MG/ML IV SOLN
INTRAVENOUS | Status: AC
  Filled 2018-04-06: qty 4

## 2018-04-06 MED ORDER — PHENYLEPHRINE HCL 10 MG/ML IJ SOLN
INTRAMUSCULAR | Status: DC | PRN
  Administered 2018-04-06: 80 ug via INTRAVENOUS
  Administered 2018-04-06: 40 ug via INTRAVENOUS
  Administered 2018-04-06 (×3): 80 ug via INTRAVENOUS
  Administered 2018-04-06: 40 ug via INTRAVENOUS

## 2018-04-06 MED ORDER — MIDAZOLAM HCL 5 MG/5ML IJ SOLN
INTRAMUSCULAR | Status: DC | PRN
  Administered 2018-04-06 (×2): 1 mg via INTRAVENOUS

## 2018-04-06 MED ORDER — SODIUM CHLORIDE 0.9 % IV SOLN: INTRAVENOUS | Status: DC

## 2018-04-06 NOTE — Op Note (Addendum)
[facility] Patient Name: Daniel Campos Procedure Date: 04/06/2018 8:06 AM MRN: [id number] Date of Birth: 09-23-1955 Attending MD: Norvel Richards , MD CSN: [id number] Age: 62 Admit Type: Outpatient Procedure:                Upper GI endoscopy Indications:              Melena Providers:                Norvel Richards, MD, Lurline Del, RN, Randa Spike, Technician Referring MD:              Medicines:                Propofol per Anesthesia Complications:            No immediate complications. Estimated Blood Loss:     Estimated blood loss was minimal. Procedure:                Pre-Anesthesia Assessment:                           - Prior to the procedure, a History and Physical                            was performed, and patient medications and                            allergies were reviewed. The patient's tolerance of                            previous anesthesia was also reviewed. The risks                            and benefits of the procedure and the sedation                            options and risks were discussed with the patient.                            All questions were answered, and informed consent                            was obtained. Prior Anticoagulants: The patient                            last took Eliquis (apixaban) 2 days prior to the                            procedure. ASA Grade Assessment: III - A patient                            with severe systemic disease. After reviewing the  risks and benefits, the patient was deemed in                            satisfactory condition to undergo the procedure.                           After obtaining informed consent, the endoscope was                            passed under direct vision. Throughout the                            procedure, the patient's blood pressure, pulse, and                            oxygen saturations were  monitored continuously. The                            GIF-H190 ([id number]) scope was introduced through the                            and advanced to the second part of duodenum. The                            upper GI endoscopy was accomplished without                            difficulty. The patient tolerated the procedure                            well. Scope In: 8:35:16 AM Scope Out: 8:42:04 AM Total Procedure Duration: 0 hours 6 minutes 48 seconds  Findings:      Esophagitis was found. Couple of erosions just above the GE junction. No       Barrett's epithelium seen.      Diffuse nodular mucosa was found in the entire examined stomach. No       findings consistent with ulcer or infiltrating process.      The duodenal bulb and second portion of the duodenum were normal.       Finally, the abnormal appearing gastric mucosa was biopsied with a cold       forceps for histology. Estimated blood loss was minimal. Impression:               -Mild erosive reflux esophagitis.                           - Nodular mucosa in the entire stomach. Biopsied.                           - Normal duodenal bulb and second portion of the                            duodenum. Moderate Sedation:      Moderate (conscious) sedation was personally administered by an       anesthesia professional. The  following parameters were monitored: oxygen       saturation, heart rate, blood pressure, respiratory rate, EKG, adequacy       of pulmonary ventilation, and response to care. Recommendation:           - Patient has a contact number available for                            emergencies. The signs and symptoms of potential                            delayed complications were discussed with the                            patient. Return to normal activities tomorrow.                            Written discharge instructions were provided to the                            patient.                           -  Advance diet as tolerated.                           - Continue present medications.                           - No repeat upper endoscopy.                           - Return to GI office (date not yet determined).                            Daily PPI. See colonoscopy report. Follow-up on                            pathology report Procedure Code(s):        --- Professional ---                           [phone], Esophagogastroduodenoscopy, flexible,                            transoral; with biopsy, single or multiple Diagnosis Code(s):        --- Professional ---                           K20.9, Esophagitis, unspecified                           K31.89, Other diseases of stomach and duodenum                           K92.1, Melena (includes Hematochezia) CPT copyright 2018 [employer]. All rights reserved. The codes documented in this report are preliminary  and upon coder review may  be revised to meet current compliance requirements. Cristopher Estimable. Belmira Daley, MD Norvel Richards, MD 04/06/2018 9:30:06 AM This report has been signed electronically. Number of Addenda: 0

## 2018-04-06 NOTE — Discharge Summary (Signed)
Physician Discharge Summary  Daniel Campos QBH:[id number] DOB: 12-Jan-1956 DOA: 04/05/2018  PCP: [facility]  Admit date: 04/05/2018 Discharge date: 04/06/2018  Admitted From: Home Disposition: Home  Recommendations for Outpatient Follow-up:  1. Follow-up with GI in 3 months.  Restart aspirin and Xarelto on 11/27  Discharge Condition: Stable CODE STATUS: Full code Diet recommendation: Heart healthy carb modified  Brief/Interim Summary: 62 year old male with history of hypertension, diabetes, prior pulmonary embolus on anticoagulation, was directly admitted to the hospital to undergo bowel prep for colonoscopy.  Patient had been having anemia, heme positive stools and melena intermittently for quite some time.  Previous attempts at undergoing outpatient colonoscopy/EGD had been attempted, but patient had complications with bowel prep.  He was admitted to the hospital and underwent bowel prep with EGD/colonoscopy the following day.  EGD was relatively unrevealing with mild erosive reflux esophagitis.  Colonoscopy was noted to have 2 polyps in the ascending colon and 2 polyps in the descending colon which were removed.  He will follow-up with GI in the next 3 months.  Patient does not have any complaints at this time.  He is been advised to restart his anticoagulation and aspirin on 11/27.  Discharge Diagnoses:  Active Problems:   Diabetes ([place])   Essential hypertension, benign   Pulmonary embolism ([place])   Melena    Discharge Instructions  Discharge Instructions    Diet - low sodium heart healthy   Complete by:  As directed    Increase activity slowly   Complete by:  As directed      Allergies as of 04/06/2018   No Known Allergies     Medication List    TAKE these medications   apixaban 5 MG Tabs tablet Commonly known as:  ELIQUIS Take 1 tablet (5 mg total) by mouth 2 (two) times daily. Resume on 04/12/18 What changed:  additional instructions   aspirin  81 MG tablet Take 1 tablet (81 mg total) by mouth daily. Resume on 04/12/18 What changed:  additional instructions   B-complex with vitamin C tablet Take 1 tablet by mouth daily.   capsaicin 0.025 % cream Commonly known as:  ZOSTRIX Apply 1 application topically 3 (three) times daily.   insulin detemir 100 UNIT/ML injection Commonly known as:  LEVEMIR Inject 0.2 mLs (20 Units total) into the skin at bedtime. What changed:    how much to take  additional instructions   NOVOLOG FLEXPEN 100 UNIT/ML FlexPen Generic drug:  insulin aspart Inject 5 Units into the skin 3 (three) times daily with meals.   omeprazole 40 MG capsule Commonly known as:  PRILOSEC Take 40 mg by mouth every morning.   pregabalin 100 MG capsule Commonly known as:  LYRICA Take 100 mg by mouth 3 (three) times daily.   VITAMIN D PO Take 1 tablet by mouth daily.       No Known Allergies  Consultations:  Gastroenterology   Procedures/Studies:  No results found.    Subjective: No abdominal pain, no vomiting  Discharge Exam: Vitals:   04/06/18 0815 04/06/18 0932 04/06/18 0935 04/06/18 0945  BP: (!) 150/90 132/74  132/74  Pulse: (!) 103 98  (!) 101  Resp:  16  19  Temp:  97.7 F (36.5 C)    TempSrc:      SpO2:  96% 100% 100%  Weight:      Height:        General: Pt is alert, awake, not in acute distress Cardiovascular: RRR,  S1/S2 +, no rubs, no gallops Respiratory: CTA bilaterally, no wheezing, no rhonchi Abdominal: Soft, NT, ND, bowel sounds + Extremities: no edema, no cyanosis    The results of significant diagnostics from this hospitalization (including imaging, microbiology, ancillary and laboratory) are listed below for reference.     Microbiology: No results found for this or any previous visit (from the past 240 hour(s)).   Labs: BNP (last 3 results) No results for input(s): BNP in the last 8760 hours. Basic Metabolic Panel: Recent Labs  Lab 04/05/18 1015  04/06/18 0518  NA 137 138  K 4.4 4.3  CL 106 106  CO2 25 23  GLUCOSE 193* 352*  BUN 21 17  CREATININE 1.17 1.13  CALCIUM 8.4* 8.6*   Liver Function Tests: No results for input(s): AST, ALT, ALKPHOS, BILITOT, PROT, ALBUMIN in the last 168 hours. No results for input(s): LIPASE, AMYLASE in the last 168 hours. No results for input(s): AMMONIA in the last 168 hours. CBC: Recent Labs  Lab 04/05/18 1015 04/06/18 0518  WBC 8.2 7.9  HGB 9.5* 10.0*  HCT 30.1* 31.8*  MCV 89.1 89.6  PLT 285 299   Cardiac Enzymes: No results for input(s): CKTOTAL, CKMB, CKMBINDEX, TROPONINI in the last 168 hours. BNP: Invalid input(s): POCBNP CBG: Recent Labs  Lab 04/05/18 2236 04/06/18 0258 04/06/18 0740 04/06/18 0939 04/06/18 1120  GLUCAP 91 272* 333* 337* 248*   D-Dimer No results for input(s): DDIMER in the last 72 hours. Hgb A1c No results for input(s): HGBA1C in the last 72 hours. Lipid Profile No results for input(s): CHOL, HDL, LDLCALC, TRIG, CHOLHDL, LDLDIRECT in the last 72 hours. Thyroid function studies No results for input(s): TSH, T4TOTAL, T3FREE, THYROIDAB in the last 72 hours.  Invalid input(s): FREET3 Anemia work up No results for input(s): VITAMINB12, FOLATE, FERRITIN, TIBC, IRON, RETICCTPCT in the last 72 hours. Urinalysis    Component Value Date/Time   COLORURINE YELLOW 04/26/2017 [place] 04/26/2017 1352   LABSPEC 1.015 04/26/2017 1352   PHURINE 5.0 04/26/2017 1352   GLUCOSEU 150 (A) 04/26/2017 1352   HGBUR SMALL (A) 04/26/2017 1352   BILIRUBINUR NEGATIVE 04/26/2017 1352   KETONESUR NEGATIVE 04/26/2017 1352   PROTEINUR NEGATIVE 04/26/2017 1352   UROBILINOGEN 0.2 11/16/2010 1144   NITRITE NEGATIVE 04/26/2017 1352   LEUKOCYTESUR NEGATIVE 04/26/2017 1352   Sepsis Labs Invalid input(s): PROCALCITONIN,  WBC,  LACTICIDVEN Microbiology No results found for this or any previous visit (from the past 240 hour(s)).   Time coordinating discharge:  23mins  SIGNED:   Kathie Dike, MD  [facility] 04/06/2018, 8:37 PM Pager   If 7PM-7AM, please contact night-coverage [email or web address] Password [email or web address]

## 2018-04-06 NOTE — Progress Notes (Signed)
Hypoglycemic Event  CBG: 66  Treatment: 15 GM carbohydrate snack  Symptoms: Pale  Follow-up CBG: Time:2230CBG Result:91  Possible Reasons for Event: Medication regimen: insulin given at evening meal  Comments/MD notified:will continue to [employer]

## 2018-04-06 NOTE — Progress Notes (Signed)
Patient discharging home.  IV removed - WNL.  Reviewed AVS and medication updates.  Verbalizes understanding, in NAD at this time

## 2018-04-06 NOTE — Progress Notes (Signed)
Tap water enema given per rectum. Green colored water returned. Patient nauseated and Zofran 4mg  given IV.

## 2018-04-06 NOTE — Progress Notes (Signed)
Inpatient Diabetes Program Recommendations  AACE/ADA: New Consensus Statement on Inpatient Glycemic Control (2019)  Target Ranges:  Prepandial:   less than 140 mg/dL      Peak postprandial:   less than 180 mg/dL (1-2 hours)      Critically ill patients:  140 - 180 mg/dL  Results for KENTARIUS, PARTINGTON (MRN [id number]) as of 04/06/2018 07:21  Ref. Range 04/06/2018 05:18  Glucose Latest Ref Range: 70 - 99 mg/dL 352 (H)   Results for KADRIAN, PARTCH (MRN [id number]) as of 04/06/2018 07:21  Ref. Range 04/05/2018 11:38 04/05/2018 17:17 04/05/2018 21:58 04/05/2018 22:36 04/06/2018 02:58  Glucose-Capillary Latest Ref Range: 70 - 99 mg/dL 219 (H) 280 (H) 66 (L) 91 272 (H)   Review of Glycemic Control  Outpatient Diabetes medications: Levemir 30 units QHS, Novolog 5 units TID with meals Current orders for Inpatient glycemic control: Novolog 0-15 units TID with meals, Novolog 0-5 units QHS  Inpatient Diabetes Program Recommendations:  Insulin - Basal: Please consider ordering Levemir 15 units Q24H starting now (based on 78.9 kg x 0.2 units). HbgA1C: Please consider ordering an A1C to evaluate glycemic control over the past 2-3 months.  NOTE: Fasting glucose 352 mg/dl this morning and patient is currently NPO for procedure today.   Thanks, Barnie Alderman, RN, MSN, CDE Diabetes Coordinator Inpatient Diabetes Program [phone] (Team Pager from 8am to 5pm)

## 2018-04-06 NOTE — Anesthesia Postprocedure Evaluation (Signed)
Anesthesia Post Note  Patient: Daniel Campos  Procedure(s) Performed: COLONOSCOPY WITH PROPOFOL (N/A ) ESOPHAGOGASTRODUODENOSCOPY (EGD) WITH PROPOFOL (N/A ) BIOPSY POLYPECTOMY  Patient location during evaluation: PACU Anesthesia Type: General Level of consciousness: awake and alert and oriented Pain management: pain level controlled Respiratory status: spontaneous breathing Cardiovascular status: blood pressure returned to baseline Postop Assessment: no apparent nausea or vomiting Anesthetic complications: no     Last Vitals:  Vitals:   04/06/18 0815 04/06/18 0935  BP: (!) 150/90   Pulse: (!) 103   Resp:    Temp:    SpO2:  100%    Last Pain:  Vitals:   04/06/18 0825  TempSrc:   PainSc: 0-No pain                 Emir Nack

## 2018-04-06 NOTE — Op Note (Addendum)
[facility] Patient Name: Daniel Campos Procedure Date: 04/06/2018 8:47 AM MRN: [id number] Date of Birth: 25-Apr-1956 Attending MD: Norvel Richards , MD CSN: [id number] Age: 62 Admit Type: Outpatient Procedure:                Colonoscopy Indications:              Heme positive stool Providers:                Norvel Richards, MD, Lurline Del, RN, Randa Spike, Technician Referring MD:              Medicines:                Propofol per Anesthesia Complications:            No immediate complications. Estimated Blood Loss:     Estimated blood loss was minimal. Procedure:                Pre-Anesthesia Assessment:                           - Prior to the procedure, a History and Physical                            was performed, and patient medications and                            allergies were reviewed. The patient's tolerance of                            previous anesthesia was also reviewed. The risks                            and benefits of the procedure and the sedation                            options and risks were discussed with the patient.                            All questions were answered, and informed consent                            was obtained. Prior Anticoagulants: The patient                            last took Eliquis (apixaban) 2 days prior to the                            procedure. ASA Grade Assessment: III - A patient                            with severe systemic disease. After reviewing the  risks and benefits, the patient was deemed in                            satisfactory condition to undergo the procedure.                           After obtaining informed consent, the colonoscope                            was passed under direct vision. Throughout the                            procedure, the patient's blood pressure, pulse, and                            oxygen  saturations were monitored continuously. The                            CF-[id number] ([id number]) scope was introduced through                            the and advanced to the 5 cm into the ileum. The                            colonoscopy was performed without difficulty. The                            patient tolerated the procedure well. The quality                            of the bowel preparation was adequate. The terminal                            ileum, ileocecal valve, appendiceal orifice, and                            rectum and the ileocecal valve, appendiceal                            orifice, and rectum were photographed. The terminal                            ileum, ileocecal valve, appendiceal orifice, and                            rectum were photographed. Scope In: 8:51:58 AM Scope Out: 9:26:10 AM Scope Withdrawal Time: 0 hours 28 minutes 12 seconds  Total Procedure Duration: 0 hours 34 minutes 12 seconds  Findings:      The perianal and digital rectal examinations were normal.      Two sessile polyps were found in the ascending colon and cecum. The       polyps were 5 to 7 mm in size. These polyps were removed with a cold       snare. Resection and  retrieval were complete. Estimated blood loss was       minimal.      Two sessile polyps were found in the descending colon. The polyps were       10 to 15 mm in size. These polyps were removed with a hot snare.       Resection and retrieval were complete. Estimated blood loss: none.      The exam was otherwise without abnormality on direct and retroflexion       views. Impression:               - Two 5 to 7 mm polyps in the ascending colon and                            in the cecum, removed with a cold snare. Resected                            and retrieved.                           - Two 10 to 15 mm polyps in the descending colon,                            removed with a hot snare. Resected and retrieved.                            - The examination was otherwise normal on direct                            and retroflexion views. Moderate Sedation:      Moderate (conscious) sedation was personally administered by an       anesthesia professional. The following parameters were monitored: oxygen       saturation, heart rate, blood pressure, respiratory rate, EKG, adequacy       of pulmonary ventilation, and response to care. Recommendation:           - Return patient to hospital ward for observation.                           - Diabetic (ADA) diet.                           - Return to my office in 3 months. Blood sugar and                            blood pressure suboptimally controlled. Suggest he                            have close interval follow-up with primary care                            physician. From a GI standpoint, could be                            discharged later today. Follow-up on pathology.  Resume Eliquis and aspirin on 04/12/2018; I'll                            follow-up on pathology. See EGD report. Procedure Code(s):        --- Professional ---                           [phone], Colonoscopy, flexible; with removal of                            tumor(s), polyp(s), or other lesion(s) by snare                            technique Diagnosis Code(s):        --- Professional ---                           D12.2, Benign neoplasm of ascending colon                           D12.0, Benign neoplasm of cecum                           D12.4, Benign neoplasm of descending colon                           R19.5, Other fecal abnormalities CPT copyright 2018 American Medical Association. All rights reserved. The codes documented in this report are preliminary and upon coder review may  be revised to meet current compliance requirements. Cristopher Estimable. Rourk, MD Norvel Richards, MD 04/06/2018 9:37:13 AM This report has been signed electronically. Number of Addenda:  0

## 2018-04-06 NOTE — Interval H&P Note (Signed)
History and Physical Interval Note:  04/06/2018 7:33 AM  Daniel Campos  has presented today for surgery, with the diagnosis of anemia, heme + stool, melena  The various methods of treatment have been discussed with the patient and family. After consideration of risks, benefits and other options for treatment, the patient has consented to  Procedure(s): COLONOSCOPY WITH PROPOFOL (N/A) ESOPHAGOGASTRODUODENOSCOPY (EGD) WITH PROPOFOL (N/A) as a surgical intervention .  The patient's history has been reviewed, patient examined, no change in status, stable for surgery.  I have reviewed the patient's chart and labs.  Questions were answered to the patient's satisfaction.     Daniel Campos  Seen in short stay.  No change.  Patient denies dysphagia.  Xarelto held x2 days.  EGD and colonoscopy per plan.

## 2018-04-06 NOTE — Transfer of Care (Signed)
Immediate Anesthesia Transfer of Care Note  Patient: Daniel Campos  Procedure(s) Performed: COLONOSCOPY WITH PROPOFOL (N/A ) ESOPHAGOGASTRODUODENOSCOPY (EGD) WITH PROPOFOL (N/A ) BIOPSY POLYPECTOMY  Patient Location: PACU  Anesthesia Type:General  Level of Consciousness: awake and alert   Airway & Oxygen Therapy: Patient Spontanous Breathing  Post-op Assessment: Report given to RN  Post vital signs: Reviewed  Last Vitals:  Vitals Value Taken Time  BP 100/62 04/06/2018  9:35 AM  Temp    Pulse 97 04/06/2018  9:37 AM  Resp    SpO2 100 % 04/06/2018  9:37 AM  Vitals shown include unvalidated device data.  Last Pain:  Vitals:   04/06/18 0825  TempSrc:   PainSc: 0-No pain         Complications: No apparent anesthesia complications

## 2018-04-06 NOTE — Addendum Note (Signed)
Addendum  created 04/06/18 1406 by Ollen Bowl, CRNA   Intraprocedure Flowsheets edited

## 2018-04-06 NOTE — Anesthesia Preprocedure Evaluation (Signed)
Anesthesia Evaluation  Patient identified by MRN, date of birth, ID band Patient awake    Reviewed: Allergy & Precautions, NPO status , Patient's Chart, lab work & pertinent test results  Airway Mallampati: II  TM Distance: >3 FB Neck ROM: Full    Dental no notable dental hx. (+) Teeth Intact   Pulmonary neg pulmonary ROS, former smoker,    Pulmonary exam normal breath sounds clear to auscultation       Cardiovascular Exercise Tolerance: Good hypertension, Pt. on medications negative cardio ROS Normal cardiovascular examI Rhythm:Regular Rate:Normal  Pt admitted for prep  Apparently no HTN meds given  DBP up this am , will give 10mg  labetolol and Glc up to 330 this am  Will give 5 units regular insulin    Neuro/Psych negative neurological ROS  negative psych ROS   GI/Hepatic negative GI ROS, Neg liver ROS,   Endo/Other  negative endocrine ROSdiabetes, Type 1, Insulin Dependent  Renal/GU Renal disease  negative genitourinary   Musculoskeletal negative musculoskeletal ROS (+)   Abdominal   Peds negative pediatric ROS (+)  Hematology negative hematology ROS (+) anemia ,   Anesthesia Other Findings   Reproductive/Obstetrics negative OB ROS                             Anesthesia Physical Anesthesia Plan  ASA: III  Anesthesia Plan: General   Post-op Pain Management:    Induction: Intravenous  PONV Risk Score and Plan:   Airway Management Planned: Nasal Cannula and Simple Face Mask  Additional Equipment:   Intra-op Plan:   Post-operative Plan:   Informed Consent: I have reviewed the patients History and Physical, chart, labs and discussed the procedure including the risks, benefits and alternatives for the proposed anesthesia with the patient or authorized representative who has indicated his/her understanding and acceptance.   Dental advisory given  Plan Discussed with:  CRNA  Anesthesia Plan Comments:         Anesthesia Quick Evaluation

## 2018-04-07 ENCOUNTER — Encounter: Payer: Self-pay | Admitting: Internal Medicine

## 2018-04-12 ENCOUNTER — Encounter (HOSPITAL_COMMUNITY): Payer: Self-pay | Admitting: Internal Medicine

## 2018-10-21 ENCOUNTER — Other Ambulatory Visit: Payer: Self-pay

## 2018-10-21 ENCOUNTER — Encounter (HOSPITAL_COMMUNITY): Payer: Self-pay | Admitting: Emergency Medicine

## 2018-10-21 ENCOUNTER — Emergency Department (HOSPITAL_COMMUNITY): Payer: Medicare Other

## 2018-10-21 ENCOUNTER — Inpatient Hospital Stay (HOSPITAL_COMMUNITY)
Admission: EM | Admit: 2018-10-21 | Discharge: 2018-10-25 | DRG: 240 | Disposition: A | Discharge status: Rehabilitation Facility | Payer: Medicare Other | Attending: Internal Medicine | Admitting: Internal Medicine

## 2018-10-21 DIAGNOSIS — Z79899 Other long term (current) drug therapy: Secondary | ICD-10-CM | POA: Diagnosis not present

## 2018-10-21 DIAGNOSIS — Z7982 Long term (current) use of aspirin: Secondary | ICD-10-CM

## 2018-10-21 DIAGNOSIS — Z89619 Acquired absence of unspecified leg above knee: Secondary | ICD-10-CM | POA: Diagnosis not present

## 2018-10-21 DIAGNOSIS — S78112A Complete traumatic amputation at level between left hip and knee, initial encounter: Secondary | ICD-10-CM | POA: Diagnosis not present

## 2018-10-21 DIAGNOSIS — R7309 Other abnormal glucose: Secondary | ICD-10-CM | POA: Diagnosis not present

## 2018-10-21 DIAGNOSIS — E1169 Type 2 diabetes mellitus with other specified complication: Secondary | ICD-10-CM | POA: Diagnosis not present

## 2018-10-21 DIAGNOSIS — Z993 Dependence on wheelchair: Secondary | ICD-10-CM | POA: Diagnosis not present

## 2018-10-21 DIAGNOSIS — Z4781 Encounter for orthopedic aftercare following surgical amputation: Secondary | ICD-10-CM | POA: Diagnosis not present

## 2018-10-21 DIAGNOSIS — Z8719 Personal history of other diseases of the digestive system: Secondary | ICD-10-CM

## 2018-10-21 DIAGNOSIS — L03116 Cellulitis of left lower limb: Secondary | ICD-10-CM | POA: Diagnosis not present

## 2018-10-21 DIAGNOSIS — R338 Other retention of urine: Secondary | ICD-10-CM | POA: Diagnosis present

## 2018-10-21 DIAGNOSIS — Z86711 Personal history of pulmonary embolism: Secondary | ICD-10-CM | POA: Diagnosis present

## 2018-10-21 DIAGNOSIS — E1151 Type 2 diabetes mellitus with diabetic peripheral angiopathy without gangrene: Secondary | ICD-10-CM | POA: Diagnosis not present

## 2018-10-21 DIAGNOSIS — I998 Other disorder of circulatory system: Secondary | ICD-10-CM | POA: Diagnosis present

## 2018-10-21 DIAGNOSIS — I10 Essential (primary) hypertension: Secondary | ICD-10-CM | POA: Diagnosis present

## 2018-10-21 DIAGNOSIS — N401 Enlarged prostate with lower urinary tract symptoms: Secondary | ICD-10-CM | POA: Diagnosis present

## 2018-10-21 DIAGNOSIS — Z1159 Encounter for screening for other viral diseases: Secondary | ICD-10-CM

## 2018-10-21 DIAGNOSIS — E11610 Type 2 diabetes mellitus with diabetic neuropathic arthropathy: Secondary | ICD-10-CM | POA: Diagnosis present

## 2018-10-21 DIAGNOSIS — Z86718 Personal history of other venous thrombosis and embolism: Secondary | ICD-10-CM

## 2018-10-21 DIAGNOSIS — K219 Gastro-esophageal reflux disease without esophagitis: Secondary | ICD-10-CM | POA: Diagnosis not present

## 2018-10-21 DIAGNOSIS — Z8249 Family history of ischemic heart disease and other diseases of the circulatory system: Secondary | ICD-10-CM | POA: Diagnosis not present

## 2018-10-21 DIAGNOSIS — I739 Peripheral vascular disease, unspecified: Secondary | ICD-10-CM | POA: Diagnosis not present

## 2018-10-21 DIAGNOSIS — E162 Hypoglycemia, unspecified: Secondary | ICD-10-CM | POA: Diagnosis not present

## 2018-10-21 DIAGNOSIS — E109 Type 1 diabetes mellitus without complications: Secondary | ICD-10-CM | POA: Diagnosis not present

## 2018-10-21 DIAGNOSIS — Z89612 Acquired absence of left leg above knee: Secondary | ICD-10-CM | POA: Diagnosis not present

## 2018-10-21 DIAGNOSIS — R195 Other fecal abnormalities: Secondary | ICD-10-CM | POA: Diagnosis not present

## 2018-10-21 DIAGNOSIS — Z794 Long term (current) use of insulin: Secondary | ICD-10-CM

## 2018-10-21 DIAGNOSIS — D72829 Elevated white blood cell count, unspecified: Secondary | ICD-10-CM | POA: Diagnosis not present

## 2018-10-21 DIAGNOSIS — I96 Gangrene, not elsewhere classified: Secondary | ICD-10-CM | POA: Diagnosis present

## 2018-10-21 DIAGNOSIS — Z419 Encounter for procedure for purposes other than remedying health state, unspecified: Secondary | ICD-10-CM

## 2018-10-21 DIAGNOSIS — E119 Type 2 diabetes mellitus without complications: Secondary | ICD-10-CM | POA: Diagnosis not present

## 2018-10-21 DIAGNOSIS — D649 Anemia, unspecified: Secondary | ICD-10-CM | POA: Diagnosis present

## 2018-10-21 DIAGNOSIS — Z7901 Long term (current) use of anticoagulants: Secondary | ICD-10-CM

## 2018-10-21 DIAGNOSIS — E1152 Type 2 diabetes mellitus with diabetic peripheral angiopathy with gangrene: Secondary | ICD-10-CM | POA: Diagnosis not present

## 2018-10-21 DIAGNOSIS — E1142 Type 2 diabetes mellitus with diabetic polyneuropathy: Secondary | ICD-10-CM | POA: Diagnosis present

## 2018-10-21 DIAGNOSIS — L97229 Non-pressure chronic ulcer of left calf with unspecified severity: Secondary | ICD-10-CM | POA: Diagnosis present

## 2018-10-21 DIAGNOSIS — K5900 Constipation, unspecified: Secondary | ICD-10-CM | POA: Diagnosis not present

## 2018-10-21 DIAGNOSIS — Z03818 Encounter for observation for suspected exposure to other biological agents ruled out: Secondary | ICD-10-CM | POA: Diagnosis not present

## 2018-10-21 DIAGNOSIS — E871 Hypo-osmolality and hyponatremia: Secondary | ICD-10-CM | POA: Diagnosis not present

## 2018-10-21 DIAGNOSIS — R339 Retention of urine, unspecified: Secondary | ICD-10-CM | POA: Diagnosis not present

## 2018-10-21 DIAGNOSIS — E11649 Type 2 diabetes mellitus with hypoglycemia without coma: Secondary | ICD-10-CM

## 2018-10-21 DIAGNOSIS — Z87891 Personal history of nicotine dependence: Secondary | ICD-10-CM | POA: Diagnosis not present

## 2018-10-21 DIAGNOSIS — E11622 Type 2 diabetes mellitus with other skin ulcer: Secondary | ICD-10-CM | POA: Diagnosis present

## 2018-10-21 DIAGNOSIS — E46 Unspecified protein-calorie malnutrition: Secondary | ICD-10-CM | POA: Diagnosis not present

## 2018-10-21 DIAGNOSIS — I70262 Atherosclerosis of native arteries of extremities with gangrene, left leg: Secondary | ICD-10-CM | POA: Diagnosis not present

## 2018-10-21 DIAGNOSIS — N289 Disorder of kidney and ureter, unspecified: Secondary | ICD-10-CM | POA: Diagnosis not present

## 2018-10-21 LAB — CBC WITH DIFFERENTIAL/PLATELET
Abs Immature Granulocytes: 0.12 10*3/uL — ABNORMAL HIGH (ref 0.00–0.07)
Basophils Absolute: 0 10*3/uL (ref 0.0–0.1)
Basophils Relative: 0 %
Eosinophils Absolute: 0.1 10*3/uL (ref 0.0–0.5)
Eosinophils Relative: 0 %
HCT: 19 % — ABNORMAL LOW (ref 39.0–52.0)
Hemoglobin: 6 g/dL — CL (ref 13.0–17.0)
Immature Granulocytes: 1 %
Lymphocytes Relative: 7 %
Lymphs Abs: 1.3 10*3/uL (ref 0.7–4.0)
MCH: 26.7 pg (ref 26.0–34.0)
MCHC: 31.6 g/dL (ref 30.0–36.0)
MCV: 84.4 fL (ref 80.0–100.0)
Monocytes Absolute: 0.9 10*3/uL (ref 0.1–1.0)
Monocytes Relative: 5 %
Neutro Abs: 16.2 10*3/uL — ABNORMAL HIGH (ref 1.7–7.7)
Neutrophils Relative %: 87 %
Platelets: 720 10*3/uL — ABNORMAL HIGH (ref 150–400)
RBC: 2.25 MIL/uL — ABNORMAL LOW (ref 4.22–5.81)
RDW: 14 % (ref 11.5–15.5)
WBC: 18.6 10*3/uL — ABNORMAL HIGH (ref 4.0–10.5)
nRBC: 0 % (ref 0.0–0.2)

## 2018-10-21 LAB — BASIC METABOLIC PANEL
Anion gap: 10 (ref 5–15)
BUN: 18 mg/dL (ref 8–23)
CO2: 20 mmol/L — ABNORMAL LOW (ref 22–32)
Calcium: 8 mg/dL — ABNORMAL LOW (ref 8.9–10.3)
Chloride: 101 mmol/L (ref 98–111)
Creatinine, Ser: 1.16 mg/dL (ref 0.61–1.24)
GFR calc Af Amer: >60 mL/min (ref >60)
GFR calc non Af Amer: >60 mL/min (ref >60)
Glucose, Bld: 194 mg/dL — ABNORMAL HIGH (ref 70–99)
Potassium: 5 mmol/L (ref 3.5–5.1)
Sodium: 131 mmol/L — ABNORMAL LOW (ref 135–145)

## 2018-10-21 LAB — CBG MONITORING, ED
Glucose-Capillary: 166 mg/dL — ABNORMAL HIGH (ref 70–99)
Glucose-Capillary: 219 mg/dL — ABNORMAL HIGH (ref 70–99)

## 2018-10-21 LAB — PROTIME-INR
INR: 1.7 — ABNORMAL HIGH (ref 0.8–1.2)
Prothrombin Time: 20.1 seconds — ABNORMAL HIGH (ref 11.4–15.2)

## 2018-10-21 LAB — SARS CORONAVIRUS 2 BY RT PCR (HOSPITAL ORDER, PERFORMED IN ~~LOC~~ HOSPITAL LAB): SARS Coronavirus 2: NEGATIVE

## 2018-10-21 LAB — LACTIC ACID, PLASMA
Lactic Acid, Venous: 1 mmol/L (ref 0.5–1.9)
Lactic Acid, Venous: 0.8 mmol/L (ref 0.5–1.9)

## 2018-10-21 LAB — GLUCOSE, CAPILLARY: Glucose-Capillary: 225 mg/dL — ABNORMAL HIGH (ref 70–99)

## 2018-10-21 LAB — PREPARE RBC (CROSSMATCH)

## 2018-10-21 LAB — POC OCCULT BLOOD, ED: Fecal Occult Bld: NEGATIVE

## 2018-10-21 IMAGING — CR LEFT TIBIA AND FIBULA - 2 VIEW
1 series · 2 of 2 positions shown · non-contrast
Comparison: None.

CLINICAL DATA: Chronic lateral lower leg wound.

EXAM:
LEFT TIBIA AND FIBULA - 2 VIEW

[Series 1: ap · 0.17mm/px · 2 of 2 slices shown]
[im 1/2]
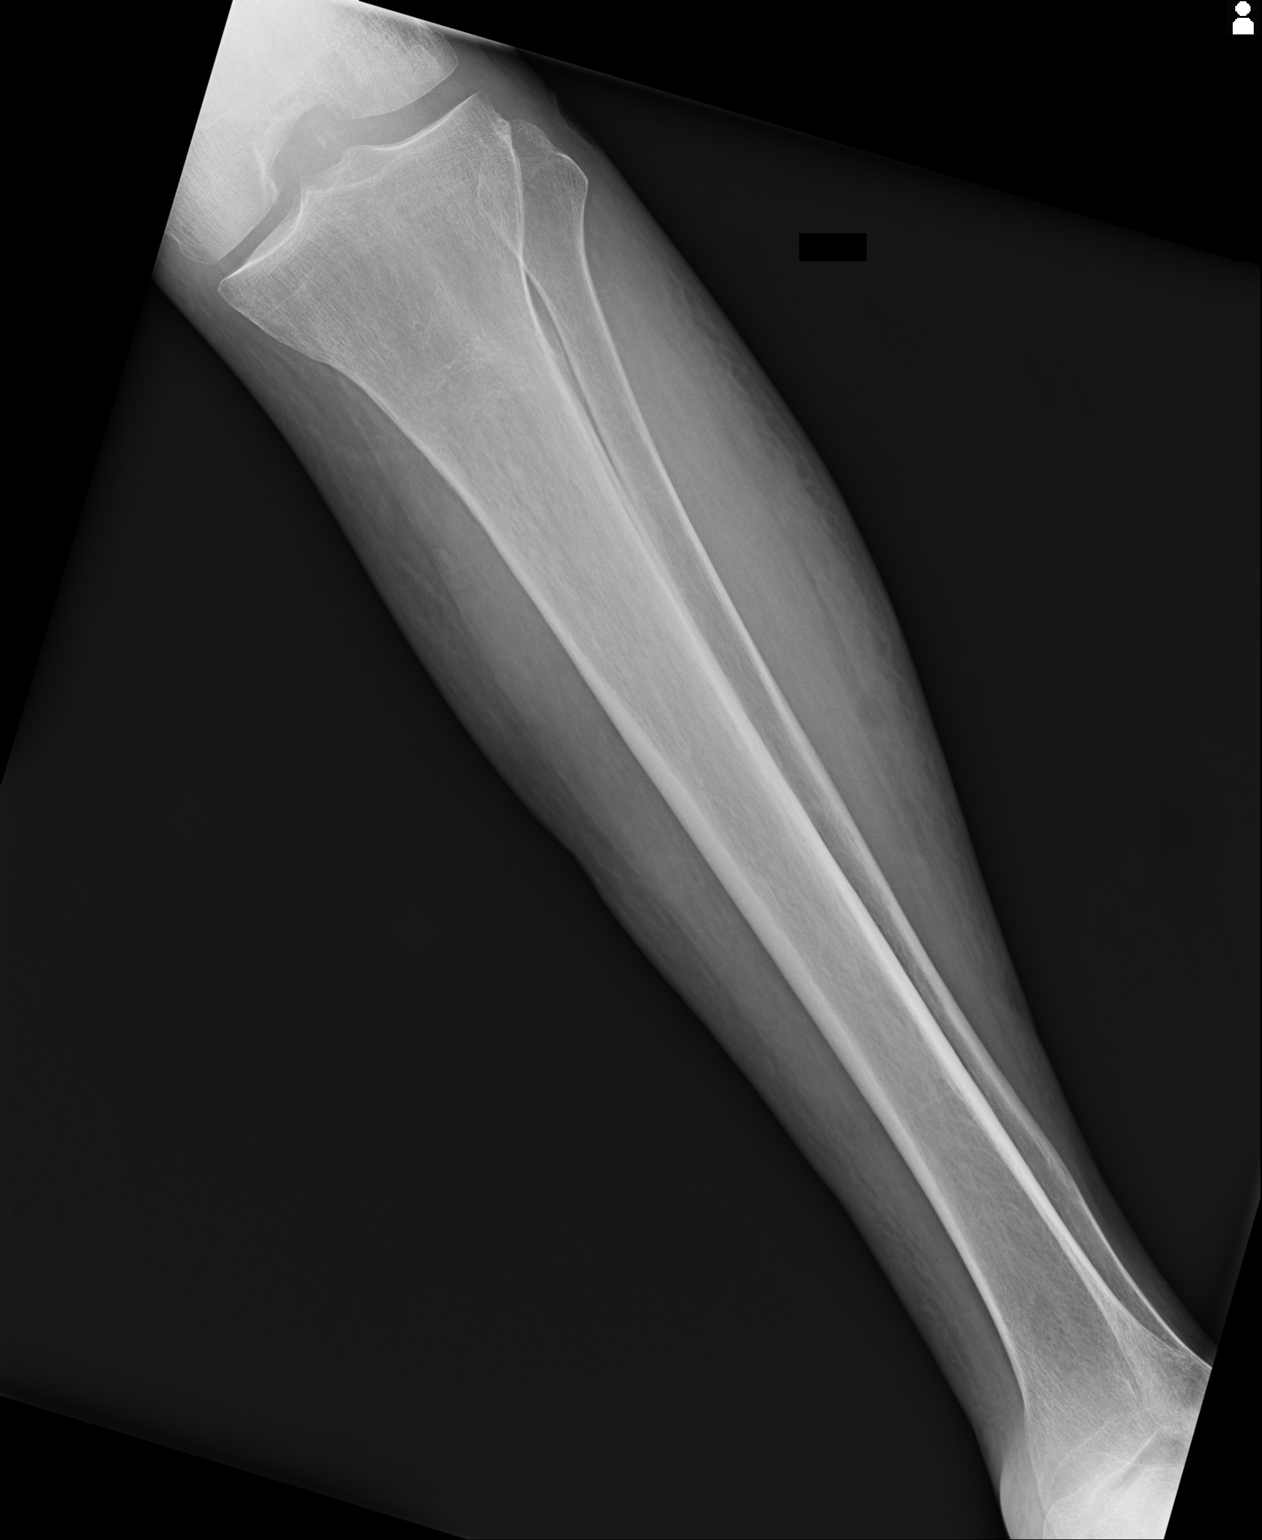
[im 2/2]
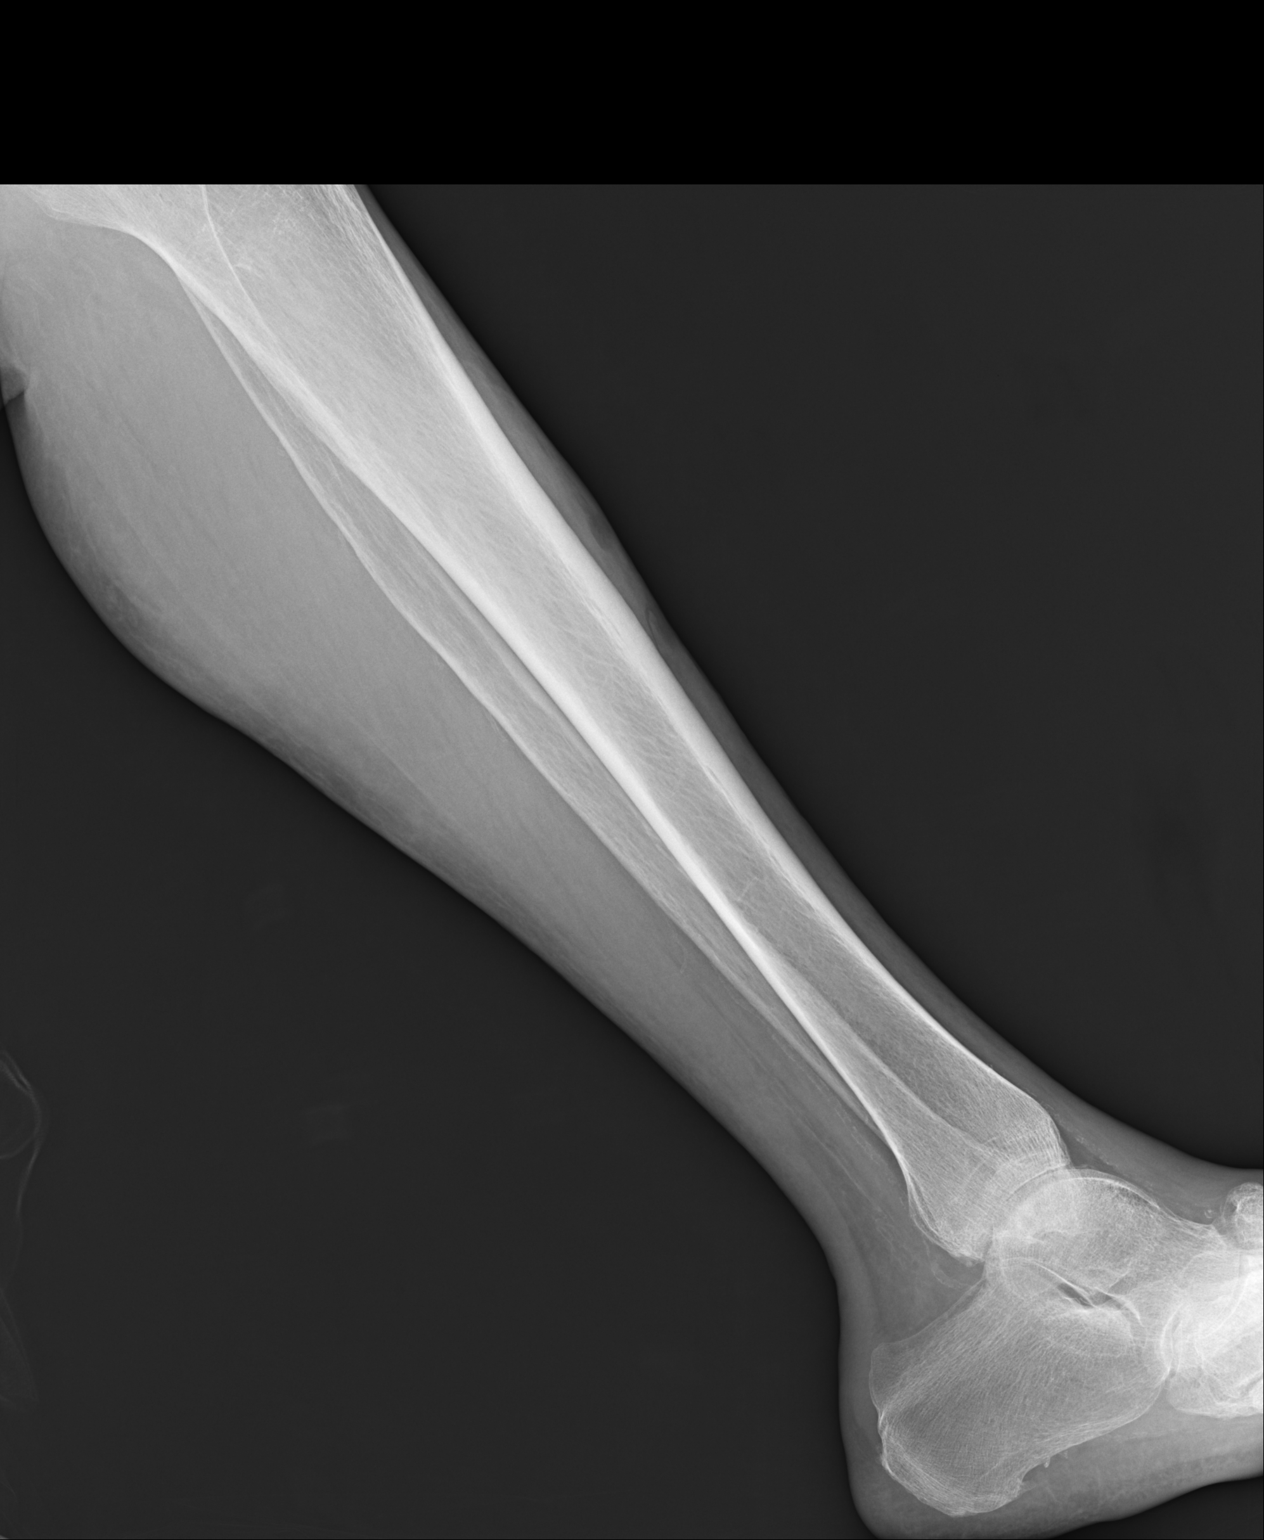

[2 of 2 positions shown; findings below may reference images not displayed]

FINDINGS: There is no evidence of fracture or other focal bone lesions. No
osseous destruction or periosteal reaction. Osteopenia. Mild diffuse
soft tissue swelling.
IMPRESSION: 1. Mild diffuse soft tissue swelling.  No acute osseous abnormality.

## 2018-10-21 MED ORDER — INSULIN GLARGINE 100 UNIT/ML ~~LOC~~ SOLN
20.0000 [IU] | Freq: Every evening | SUBCUTANEOUS | Status: DC
  Administered 2018-10-21 – 2018-10-24 (×3): 20 [IU] via SUBCUTANEOUS
  Filled 2018-10-21 (×6): qty 0.2

## 2018-10-21 MED ORDER — ONDANSETRON HCL 4 MG/2ML IJ SOLN: 4.0000 mg | Freq: Four times a day (QID) | INTRAMUSCULAR | Status: DC | PRN

## 2018-10-21 MED ORDER — SODIUM CHLORIDE 0.9 % IV SOLN
10.0000 mL/h | Freq: Once | INTRAVENOUS | Status: AC
  Administered 2018-10-21: 10 mL/h via INTRAVENOUS

## 2018-10-21 MED ORDER — TRAZODONE HCL 50 MG PO TABS
50.0000 mg | ORAL_TABLET | Freq: Once | ORAL | Status: AC
  Administered 2018-10-21: 50 mg via ORAL
  Filled 2018-10-21: qty 1

## 2018-10-21 MED ORDER — PREGABALIN 100 MG PO CAPS
100.0000 mg | ORAL_CAPSULE | Freq: Three times a day (TID) | ORAL | Status: DC
  Administered 2018-10-21 – 2018-10-25 (×10): 100 mg via ORAL
  Filled 2018-10-21 (×10): qty 1

## 2018-10-21 MED ORDER — SODIUM CHLORIDE 0.9 % IV SOLN
1.0000 g | INTRAVENOUS | Status: AC
  Administered 2018-10-21 – 2018-10-24 (×4): 1 g via INTRAVENOUS
  Filled 2018-10-21 (×4): qty 10

## 2018-10-21 MED ORDER — FENTANYL CITRATE (PF) 100 MCG/2ML IJ SOLN
25.0000 ug | Freq: Once | INTRAMUSCULAR | Status: AC
  Administered 2018-10-21: 16:00:00 25 ug via INTRAVENOUS
  Filled 2018-10-21: qty 2

## 2018-10-21 MED ORDER — SODIUM CHLORIDE 0.9 % IV BOLUS
1000.0000 mL | Freq: Once | INTRAVENOUS | Status: AC
  Administered 2018-10-21: 1000 mL via INTRAVENOUS

## 2018-10-21 MED ORDER — PANTOPRAZOLE SODIUM 40 MG PO TBEC
40.0000 mg | DELAYED_RELEASE_TABLET | Freq: Every day | ORAL | Status: DC
  Administered 2018-10-22 – 2018-10-25 (×4): 40 mg via ORAL
  Filled 2018-10-21 (×4): qty 1

## 2018-10-21 MED ORDER — OXYCODONE-ACETAMINOPHEN 5-325 MG PO TABS
1.0000 | ORAL_TABLET | ORAL | Status: DC | PRN
  Administered 2018-10-21: 21:00:00 2 via ORAL
  Administered 2018-10-22 (×2): 1 via ORAL
  Administered 2018-10-22 – 2018-10-25 (×8): 2 via ORAL
  Filled 2018-10-21 (×8): qty 2
  Filled 2018-10-21: qty 1
  Filled 2018-10-21: qty 2
  Filled 2018-10-21: qty 1

## 2018-10-21 MED ORDER — ASPIRIN EC 81 MG PO TBEC
81.0000 mg | DELAYED_RELEASE_TABLET | Freq: Every day | ORAL | Status: DC
  Administered 2018-10-22 – 2018-10-25 (×4): 81 mg via ORAL
  Filled 2018-10-21 (×4): qty 1

## 2018-10-21 MED ORDER — VANCOMYCIN HCL IN DEXTROSE 1-5 GM/200ML-% IV SOLN
1000.0000 mg | Freq: Once | INTRAVENOUS | Status: AC
  Administered 2018-10-21: 1000 mg via INTRAVENOUS
  Filled 2018-10-21: qty 200

## 2018-10-21 MED ORDER — INSULIN ASPART 100 UNIT/ML ~~LOC~~ SOLN: 5.0000 [IU] | Freq: Three times a day (TID) | SUBCUTANEOUS | Status: DC

## 2018-10-21 MED ORDER — ONDANSETRON HCL 4 MG PO TABS: 4.0000 mg | ORAL_TABLET | Freq: Four times a day (QID) | ORAL | Status: DC | PRN

## 2018-10-21 MED ORDER — INSULIN ASPART 100 UNIT/ML ~~LOC~~ SOLN
0.0000 [IU] | Freq: Every day | SUBCUTANEOUS | Status: DC
  Administered 2018-10-23: 2 [IU] via SUBCUTANEOUS

## 2018-10-21 MED ORDER — VANCOMYCIN HCL 10 G IV SOLR
1500.0000 mg | INTRAVENOUS | Status: AC
  Administered 2018-10-22 – 2018-10-24 (×2): 1500 mg via INTRAVENOUS
  Filled 2018-10-21 (×3): qty 1500

## 2018-10-21 MED ORDER — METRONIDAZOLE IN NACL 5-0.79 MG/ML-% IV SOLN
500.0000 mg | Freq: Three times a day (TID) | INTRAVENOUS | Status: AC
  Administered 2018-10-21 – 2018-10-24 (×9): 500 mg via INTRAVENOUS
  Filled 2018-10-21 (×9): qty 100

## 2018-10-21 MED ORDER — INSULIN ASPART 100 UNIT/ML ~~LOC~~ SOLN
0.0000 [IU] | Freq: Three times a day (TID) | SUBCUTANEOUS | Status: DC
  Administered 2018-10-22: 3 [IU] via SUBCUTANEOUS
  Administered 2018-10-22: 8 [IU] via SUBCUTANEOUS
  Administered 2018-10-22: 3 [IU] via SUBCUTANEOUS
  Administered 2018-10-23: 5 [IU] via SUBCUTANEOUS
  Administered 2018-10-23 – 2018-10-24 (×3): 3 [IU] via SUBCUTANEOUS
  Administered 2018-10-24: 2 [IU] via SUBCUTANEOUS

## 2018-10-21 NOTE — ED Triage Notes (Addendum)
Patient c/o multiple wounds to left lower leg with pain x3 weeks that are progressively getting worse. Patient is diabetic. Per patient started as leg pain in which he seen his PCP and had ultrasound for blood clot-which was negative, then wounds appeared. Denies any significant drainage. Patient reports using alcohol and neosporin to areas. Large scabbed areas noted on lower leg. Denies fevers but reports chills. Toes and heel purple/black in color. Significant swelling to foot.

## 2018-10-21 NOTE — ED Provider Notes (Addendum)
[facility] EMERGENCY DEPARTMENT Provider Note   CSN: [id number] Arrival date & time: 10/21/18  1322    History   Chief Complaint Chief Complaint  Patient presents with  . Wound Check    HPI Daniel Campos is a 63 y.o. male.     HPI   Daniel Campos is a 63 y.o. male with a PMH of DM, HTN, and PE diagnosed in 2018 (has not taken blood thinner for > 6-8 months)  who presents to the Emergency Department complaining of multiple, non-healing wounds to his left lower leg and foot. Symptoms have been progressing for 3 weeks.  Initially, he developed pain to his left calf and was seen by [place] and had US of the leg that he states was negative for blood clot.  Several days later, he began to notice a "sore" to his calf and since then he has developed multiple similar appearing sores to the leg and left foot that have scabbed and turned black.  He reports pain to the extremity initially, but now he reports pain distally, only just below the knee.  Symptoms area associated with chills.  He denies fever, chest pain, shortness of breath, vomiting and swelling of the extremity.  When asked why he delayed medical care, he states that it is difficult to get out of his house.    [facility] [place], no local PCP   Past Medical History:  Diagnosis Date  . Diabetes mellitus without complication ([place])   . Hypertension   . Prostate hypertrophy   . Pulmonary emboli ([place]) 2018    Patient Active Problem List   Diagnosis Date Noted  . Melena   . Heme positive stool 02/23/2018  . Bacteremia due to Gram-positive bacteria 04/29/2017  . Pulmonary embolism ([place]) 04/29/2017  . Acute urinary retention 04/27/2017  . ARF (acute renal failure) ([place]) 04/26/2017  . Tachycardia 04/26/2017  . Anemia 04/26/2017  . Hyponatremia 04/26/2017  . Loose stools 08/31/2016  . Encounter for screening colonoscopy 04/29/2016  . Rotator cuff tear 02/24/2013  . Stiffness of joint, not elsewhere  classified, other specified site 01/16/2013  . Weakness of right leg 01/16/2013  . Fracture of right shoulder 01/04/2013  . Osteopenia 01/02/2013  . Intertrochanteric fracture of right hip ([place]) 12/20/2012  . Shoulder fracture 12/20/2012  . Essential hypertension, benign 10/16/2012  . Diabetes ([place]) 08/16/2012  . Prostate hypertrophy 08/16/2012    Past Surgical History:  Procedure Laterality Date  . BIOPSY  04/06/2018   Procedure: BIOPSY;  Surgeon: Daneil Dolin, MD;  Location: [facility] END[facility] SU[facility];  Service: Endoscopy;;  (gastric)  . COLONOSCOPY WITH PROPOFOL N/A 04/06/2018   Procedure: COLONOSCOPY WITH PROPOFOL;  Surgeon: Daneil Dolin, MD;  Location: [facility] END[facility];  Service: Endoscopy;  Laterality: N/A;  . ESOPHAGOGASTRODUODENOSCOPY (EGD) WITH PROPOFOL N/A 04/06/2018   Procedure: ESOPHAGOGASTRODUODENOSCOPY (EGD) WITH PROPOFOL;  Surgeon: Daneil Dolin, MD;  Location: [facility];  Service: Endoscopy;  Laterality: N/A;  . INTRAMEDULLARY (IM) NAIL INTERTROCHANTERIC Right 12/06/2012   Procedure: INTRAMEDULLARY (IM) NAIL INTERTROCHANTRIC;  Surgeon: Carole Civil, MD;  Location: [facility] ORS;  Service: Orthopedics;  Laterality: Right;  after lunch   . KNEE SURGERY    . POLYPECTOMY  04/06/2018   Procedure: POLYPECTOMY;  Surgeon: Daneil Dolin, MD;  Location: [facility];  Service: Endoscopy;;  colon  . SHOULDER SURGERY          Home Medications    Prior  to Admission medications   Medication Sig Start Date End Date Taking? Authorizing Provider  apixaban (ELIQUIS) 5 MG TABS tablet Take 1 tablet (5 mg total) by mouth 2 (two) times daily. Resume on 04/12/18 04/06/18   Kathie Dike, MD  aspirin 81 MG tablet Take 1 tablet (81 mg total) by mouth daily. Resume on 04/12/18 04/06/18   Kathie Dike, MD  B Complex-C (B-COMPLEX WITH VITAMIN C) tablet Take 1 tablet by mouth daily.    [provider]  capsaicin (ZOSTRIX) 0.025 % cream Apply 1 application topically 3 (three) times  daily.    [provider]  Cholecalciferol (VITAMIN D PO) Take 1 tablet by mouth daily.    [provider]  insulin detemir (LEVEMIR) 100 UNIT/ML injection Inject 0.2 mLs (20 Units total) into the skin at bedtime. Patient taking differently: Inject 30 Units into the skin at bedtime. Sliding scale 04/29/17   [name], [name], MD  NOVOLOG FLEXPEN 100 UNIT/ML FlexPen Inject 5 Units into the skin 3 (three) times daily with meals.  08/26/13   [provider]  omeprazole (PRILOSEC) 40 MG capsule Take 40 mg by mouth every morning.    [provider]  pregabalin (LYRICA) 100 MG capsule Take 100 mg by mouth 3 (three) times daily.    [provider]    Family History Family History  Problem Relation Age of Onset  . Heart attack Father   . Colon polyps Neg Hx   . Colon cancer Neg Hx     Social History Social History   Tobacco Use  . Smoking status: Former Smoker    Packs/day: 1.00    Types: E-cigarettes  . Smokeless tobacco: Never Used  Substance Use Topics  . Alcohol use: No  . Drug use: No     Allergies   Patient has no known allergies.   Review of Systems Review of Systems  Constitutional: Positive for chills. Negative for appetite change and fever.  Respiratory: Negative for cough, chest tightness and shortness of breath.   Cardiovascular: Negative for chest pain.  Gastrointestinal: Negative for abdominal pain, nausea and vomiting.  Musculoskeletal: Positive for arthralgias (left lower leg pain) and myalgias.  Skin: Positive for color change and wound.       Multiple scabbed wounds to the left lower leg and foot  Neurological: Negative for dizziness, syncope, weakness, numbness and headaches.     Physical Exam Updated Vital Signs Ht 5\' 11"  (1.803 m)   Wt 77.1 kg   BMI 23.71 kg/m   Physical Exam Vitals signs and nursing note reviewed.  Constitutional:      General: He is not in acute distress.    Appearance:  Normal appearance. He is not ill-appearing or toxic-appearing.     Comments: Pt is frail appearing  HENT:     Head: Atraumatic.  Neck:     Musculoskeletal: Normal range of motion.  Cardiovascular:     Rate and Rhythm: Normal rate and regular rhythm.     Comments: No palpable DT pulses, faint DT and PT pulses hear with portable doppler.   Pulmonary:     Effort: Pulmonary effort is normal.     Breath sounds: Normal breath sounds.  Abdominal:     General: Abdomen is flat.     Palpations: Abdomen is soft.     Tenderness: There is no abdominal tenderness.  Genitourinary:    Rectum: No tenderness. Normal anal tone.     Comments: DRE chaperoned by nursing.  Performed by me.  Dark brown heme negative stool.  No palpable rectal masses Musculoskeletal:        General: Swelling and tenderness present.     Comments: Multiple, chronic appearing wounds to the left lower leg.  Multiple  thicken necrotic areas to the left foot as well with involvement of all the toes and heel.  See pictures attached.  Skin:    General: Skin is warm.     Capillary Refill: Capillary refill takes more than 3 seconds.  Neurological:     Mental Status: He is alert and oriented to person, place, and time.     GCS: GCS eye subscore is 4. GCS verbal subscore is 5. GCS motor subscore is 6.     Motor: No weakness.     Comments: CN II-XII grossly intact          ED Treatments / Results  Labs (all labs ordered are listed, but only abnormal results are displayed) Labs Reviewed  CBC WITH DIFFERENTIAL/PLATELET - Abnormal; Notable for the following components:      Result Value   WBC 18.6 (*)    RBC 2.25 (*)    Hemoglobin 6.0 (*)    HCT 19.0 (*)    Platelets 720 (*)    Neutro Abs 16.2 (*)    Abs Immature Granulocytes 0.12 (*)    All other components within normal limits  BASIC METABOLIC PANEL - Abnormal; Notable for the following components:   Sodium 131 (*)    CO2 20 (*)    Glucose, Bld 194 (*)    Calcium  8.0 (*)    All other components within normal limits  PROTIME-INR - Abnormal; Notable for the following components:   Prothrombin Time 20.1 (*)    INR 1.7 (*)    All other components within normal limits  CBG MONITORING, ED - Abnormal; Notable for the following components:   Glucose-Capillary 166 (*)    All other components within normal limits  CULTURE, BLOOD (ROUTINE X 2)  CULTURE, BLOOD (ROUTINE X 2)  SARS CORONAVIRUS 2 (HOSPITAL ORDER, [facility])  LACTIC ACID, PLASMA  LACTIC ACID, PLASMA  POC OCCULT BLOOD, ED  PREPARE RBC (CROSSMATCH)  TYPE AND SCREEN    EKG None  Radiology Dg Tibia/fibula Left  Result Date: 10/21/2018 CLINICAL DATA:  Chronic lateral lower leg wound. EXAM: LEFT TIBIA AND FIBULA - 2 VIEW COMPARISON:  None. FINDINGS: There is no evidence of fracture or other focal bone lesions. No osseous destruction or periosteal reaction. Osteopenia. Mild diffuse soft tissue swelling. IMPRESSION: 1. Mild diffuse soft tissue swelling.  No acute osseous abnormality. Electronically Signed   By: Titus Dubin M.D.   On: 10/21/2018 15:39   Dg Foot Complete Left  Result Date: 10/21/2018 CLINICAL DATA:  Toe gangrene. EXAM: LEFT FOOT - COMPLETE 3+ VIEW COMPARISON:  None. FINDINGS: No osseous destruction or periosteal reaction. No acute fracture. Healed fracture deformity of the first proximal phalanx. Chronic midfoot collapse with prominent bony hypertrophy. Mild first MTP joint osteoarthritis. Osteopenia. Diffuse soft tissue swelling. IMPRESSION: 1. Diffuse soft tissue swelling. No radiographic evidence of osteomyelitis. 2. Neuropathic arthropathy with chronic midfoot collapse. Electronically Signed   By: Titus Dubin M.D.   On: 10/21/2018 15:38    Procedures Procedures (including critical care time)  Medications Ordered in ED Medications  0.9 %  sodium chloride infusion (has no administration in time range)  sodium chloride 0.9 % bolus 1,000 mL (0  mLs Intravenous  Stopped 10/21/18 1531)  vancomycin (VANCOCIN) IVPB 1000 mg/200 mL premix (1,000 mg Intravenous New Bag/Given 10/21/18 1445)  fentaNYL (SUBLIMAZE) injection 25 mcg (25 mcg Intravenous Given 10/21/18 1623)     Initial Impression / Assessment and Plan / ED Course  I have reviewed the triage vital signs and the nursing notes.  Pertinent labs & imaging results that were available during my care of the patient were reviewed by me and considered in my medical decision making (see chart for details).      pt with chronic wounds of the extremity for 3 weeks, likely infectious with necrosis of the extremity.  He is very stable appearing.  Afebrile and blood pressure stable.  Pt also seen by Dr. Lacinda Axon and care plan discussed.   [facility] hospitalist, Dr. Nehemiah Settle, he [facility] [place] [facility].    1625  I spoke with Johnsie Cancel, Engineer, water at [place], [place] in [place] who will call back regarding bed availability.  61  Pt also seen by Dr. Roderic Palau who agrees to assume care.  Call back from [place] still pending  Final Clinical Impressions(s) / ED Diagnoses   Final diagnoses:  None    ED Discharge Orders    None       Kem Parkinson, PA-C 10/21/18 [place], Nijel Flink, PA-C 10/21/18 1710    Nat Christen, MD 10/22/18 [phone]

## 2018-10-21 NOTE — Progress Notes (Signed)
Pharmacy Antibiotic Note  Daniel Campos is a 63 y.o. male admitted on 10/21/2018 with cellulitis.  Pharmacy has been consulted for Vancomycin dosing.  Plan: Vancomycin 1500 mg IV every 24 hours.  Goal trough 10-15 mcg/mL.  Monitor labs, c/s, and vanco levels as indicated.  Height: 5\' 11"  (180.3 cm) Weight: 170 lb (77.1 kg) IBW/kg (Calculated) : 75.3  Temp (24hrs), Avg:98.3 F (36.8 C), Min:98.2 F (36.8 C), Max:98.4 F (36.9 C)  Recent Labs  Lab 10/21/18 1432 10/21/18 1434 10/21/18 1535  WBC 18.6*  --   --   CREATININE 1.16  --   --   LATICACIDVEN  --  1.0 0.8    Estimated Creatinine Clearance: 68.5 mL/min (by C-G formula based on SCr of 1.16 mg/dL).    No Known Allergies  Antimicrobials this admission: Vanco 6/6 >>  Rocephin 6/6 >>  Flagyl 6/6 >>  Microbiology results: 6/6 BCx: pending  Thank you for allowing pharmacy to be a part of this patient's care.  Ramond Craver 10/21/2018 11:59 PM

## 2018-10-21 NOTE — H&P (Signed)
History and Physical  Daniel Campos KNL:[id number] DOB: 09-21-1955 DOA: 10/21/2018  Referring physician: Kem Parkinson, PA-C, ED provider PCP: [name]  Outpatient Specialists: none  Patient Coming From: home  Chief Complaint: left foot wound check  HPI: Daniel Campos is a 63 y.o. male with a history of diabetes, hypertension, PE in 2018 not currently on anticoagulation.  Patient presents with approximately 3 weeks of nonhealing ulcerations to his left leg and foot.  Initially, he had some pain in his calf and was seen at the [facility] office.  He had an ultrasound that was negative for blood clot and was sent home.  Patient continued to have multiple sores appearing to the left leg and foot that scabbed and turned black.  His symptoms progressed to having blackness on his toes.  No palliating or provoking factors.  Denies chest pain, shortness of breath, dizziness, lightheadedness.  Emergency Department Course: White count elevated to 18.  Hemoglobin 6.  Blood cultures were obtained and the patient was started on vancomycin.  Ceftriaxone and Flagyl were added.  Patient tested negative for COVID.  Patient received 2 units of blood in the ED.  Dopplers of left foot obtained.  Review of Systems:   Pt denies any fevers, chills, nausea, vomiting, diarrhea, constipation, abdominal pain, shortness of breath, dyspnea on exertion, orthopnea, cough, wheezing, palpitations, headache, vision changes, lightheadedness, dizziness, melena, rectal bleeding.  Review of systems are otherwise negative  Past Medical History:  Diagnosis Date  . Diabetes mellitus without complication ([place])   . Hypertension   . Prostate hypertrophy   . Pulmonary emboli ([place]) 2018   Past Surgical History:  Procedure Laterality Date  . BIOPSY  04/06/2018   Procedure: BIOPSY;  Surgeon: Daneil Dolin, MD;  [facility];  Service: Endoscopy;;  (gastric)  . COLONOSCOPY WITH PROPOFOL N/A 04/06/2018    Procedure: COLONOSCOPY WITH PROPOFOL;  Surgeon: Daneil Dolin, MD;  [facility];  Service: Endoscopy;  Laterality: N/A;  . ESOPHAGOGASTRODUODENOSCOPY (EGD) WITH PROPOFOL N/A 04/06/2018   Procedure: ESOPHAGOGASTRODUODENOSCOPY (EGD) WITH PROPOFOL;  Surgeon: Daneil Dolin, MD;  [facility];  Service: Endoscopy;  Laterality: N/A;  . INTRAMEDULLARY (IM) NAIL INTERTROCHANTERIC Right 12/06/2012   Procedure: INTRAMEDULLARY (IM) NAIL INTERTROCHANTRIC;  Surgeon: Carole Civil, MD;  Location: [facility] ORS;  Service: Orthopedics;  Laterality: Right;  after lunch   . KNEE SURGERY    . POLYPECTOMY  04/06/2018   Procedure: POLYPECTOMY;  Surgeon: Daneil Dolin, MD;  [facility];  Service: Endoscopy;;  colon  . SHOULDER SURGERY     Social History:  reports that he has quit smoking. His smoking use included e-cigarettes. He smoked 1.00 pack per day. He has never used smokeless tobacco. He reports that he does not drink alcohol or use drugs. Patient lives at home  No Known Allergies  Family History  Problem Relation Age of Onset  . Heart attack Father   . Colon polyps Neg Hx   . Colon cancer Neg Hx       Prior to Admission medications   Medication Sig Start Date End Date Taking? Authorizing Provider  aspirin 81 MG tablet Take 1 tablet (81 mg total) by mouth daily. Resume on 04/12/18 04/06/18  Yes Kathie Dike, MD  B Complex-C (B-COMPLEX WITH VITAMIN C) tablet Take 1 tablet by mouth daily.   Yes [provider]  capsaicin (ZOSTRIX) 0.025 % cream Apply 1 application topically 3 (three)  times daily.   Yes [provider]  Cholecalciferol (VITAMIN D PO) Take 1 tablet by mouth daily.   Yes [provider]  insulin glargine (LANTUS) 100 UNIT/ML injection Inject 15-25 Units into the skin every evening. Injects by personal sliding scale   Yes [provider]  NOVOLOG FLEXPEN 100 UNIT/ML FlexPen Inject 5 Units into the skin 3 (three)  times daily with meals.  08/26/13  Yes [provider]  omeprazole (PRILOSEC) 40 MG capsule Take 40 mg by mouth every morning.   Yes [provider]  pregabalin (LYRICA) 100 MG capsule Take 100 mg by mouth 3 (three) times daily.   Yes [provider]    Physical Exam: BP 130/80   Pulse (!) 103   Temp 98.3 F (36.8 C) (Oral)   Resp 16   Ht '5\' 11"'  (1.803 m)   Wt 77.1 kg   SpO2 99%   BMI 23.71 kg/m   . General: Older Caucasian male. Awake and alert and oriented x3. No acute cardiopulmonary distress.  Marland Kitchen HEENT: Normocephalic atraumatic.  Right and left ears normal in appearance.  Pupils equal, round, reactive to light. Extraocular muscles are intact. Sclerae anicteric and noninjected.  Moist mucosal membranes. No mucosal lesions.  . Neck: Neck supple without lymphadenopathy. No carotid bruits. No masses palpated.  . Cardiovascular: Regular rate with normal S1-S2 sounds. No murmurs, rubs, gallops auscultated. No JVD.  Marland Kitchen Respiratory: Good respiratory effort with no wheezes, rales, rhonchi. Lungs clear to auscultation bilaterally.  No accessory muscle use. . Abdomen: Soft, nontender, nondistended. Active bowel sounds. No masses or hepatosplenomegaly  . Skin: Left foot and lower leg are cool to touch.  There are multiple black, necrotic ulcerations to his left lower leg, calf.  There is also necrotic digits of his left foot: 2, 3, 4, and 5.  Please see picture below.  Right leg is normal. . Musculoskeletal: No calf or leg pain. All major joints not erythematous nontender.  No upper or lower joint deformation.  Good ROM.  No contractures  . Psychiatric: Intact judgment and insight. Pleasant and cooperative. . Neurologic: No focal neurological deficits. Strength is 5/5 and symmetric in upper and lower extremities.  Cranial nerves II through XII are grossly intact.  Media Information   Document Information   Photos    10/21/2018 13:44  Attached To:  Hospital  Encounter on 10/21/18  Source Information   Kem Parkinson, [place]  [facility]   Media Information   Document Information   Photos    10/21/2018 13:44  Attached To:  Hospital Encounter on 10/21/18  Source Information   Kem Parkinson, [place]  [facility]gency Dept              Labs on Admission: I have personally reviewed following labs and imaging studies  CBC: Recent Labs  Lab 10/21/18 1432  WBC 18.6*  NEUTROABS 16.2*  HGB 6.0*  HCT 19.0*  MCV 84.4  PLT 878*   Basic Metabolic Panel: Recent Labs  Lab 10/21/18 1432  NA 131*  K 5.0  CL 101  CO2 20*  GLUCOSE 194*  BUN 18  CREATININE 1.16  CALCIUM 8.0*   GFR: Estimated Creatinine Clearance: 68.5 mL/min (by C-G formula based on SCr of 1.16 mg/dL). Liver Function Tests: No results for input(s): AST, ALT, ALKPHOS, BILITOT, PROT, ALBUMIN in the last 168 hours. No results for input(s): LIPASE, AMYLASE in the last 168 hours. No results for input(s): AMMONIA in the last 168 hours. Coagulation  Profile: Recent Labs  Lab 10/21/18 1432  INR 1.7*   Cardiac Enzymes: No results for input(s): CKTOTAL, CKMB, CKMBINDEX, TROPONINI in the last 168 hours. BNP (last 3 results) No results for input(s): PROBNP in the last 8760 hours. HbA1C: No results for input(s): HGBA1C in the last 72 hours. CBG: Recent Labs  Lab 10/21/18 1350  GLUCAP 166*   Lipid Profile: No results for input(s): CHOL, HDL, LDLCALC, TRIG, CHOLHDL, LDLDIRECT in the last 72 hours. Thyroid Function Tests: No results for input(s): TSH, T4TOTAL, FREET4, T3FREE, THYROIDAB in the last 72 hours. Anemia Panel: No results for input(s): VITAMINB12, FOLATE, FERRITIN, TIBC, IRON, RETICCTPCT in the last 72 hours. Urine analysis:    Component Value Date/Time   COLORURINE YELLOW 04/26/2017 1352   APPEARANCEUR CLEAR 04/26/2017 1352   LABSPEC 1.015 04/26/2017 1352   PHURINE 5.0 04/26/2017 1352   GLUCOSEU 150 (A) 04/26/2017 1352   HGBUR SMALL (A)  04/26/2017 1352   BILIRUBINUR NEGATIVE 04/26/2017 1352   KETONESUR NEGATIVE 04/26/2017 1352   PROTEINUR NEGATIVE 04/26/2017 1352   UROBILINOGEN 0.2 11/16/2010 1144   NITRITE NEGATIVE 04/26/2017 1352   LEUKOCYTESUR NEGATIVE 04/26/2017 1352   Sepsis Labs: '@LABRCNTIP' (procalcitonin:4,lacticidven:4) ) Recent Results (from the past 240 hour(s))  Blood culture (routine x 2)     Status: None (Preliminary result)   Collection Time: 10/21/18  2:32 PM  Result Value Ref Range Status   Specimen Description LEFT ANTECUBITAL  Final   Special Requests   Final    BOTTLES DRAWN AEROBIC AND ANAEROBIC Blood Culture adequate volume Performed at [facility], [address] [place], [place] [address]    Culture PENDING  Incomplete   Report Status PENDING  Incomplete  Blood culture (routine x 2)     Status: None (Preliminary result)   Collection Time: 10/21/18  2:34 PM  Result Value Ref Range Status   Specimen Description BLOOD LEFT ARM  Final   Special Requests   Final    BOTTLES DRAWN AEROBIC AND ANAEROBIC Blood Culture adequate volume Performed at [facility], [address] [place], [place] [address]    Culture PENDING  Incomplete   Report Status PENDING  Incomplete  SARS Coronavirus 2 (CEPHEID - Performed in [place] hospital lab), Hosp Order     Status: None   Collection Time: 10/21/18  3:38 PM  Result Value Ref Range Status   SARS Coronavirus 2 NEGATIVE NEGATIVE Final    Comment: (NOTE) If result is NEGATIVE SARS-CoV-2 target nucleic acids are NOT DETECTED. The SARS-CoV-2 RNA is generally detectable in upper and lower  respiratory specimens during the acute phase of infection. The lowest  concentration of SARS-CoV-2 viral copies this assay can detect is 250  copies / mL. A negative result does not preclude SARS-CoV-2 infection  and should not be used as the sole basis for treatment or other  patient management decisions.  A negative result may occur with  improper specimen collection  / handling, submission of specimen other  than nasopharyngeal swab, presence of viral mutation(s) within the  areas targeted by this assay, and inadequate number of viral copies  (<250 copies / mL). A negative result must be combined with clinical  observations, patient history, and epidemiological information. If result is POSITIVE SARS-CoV-2 target nucleic acids are DETECTED. The SARS-CoV-2 RNA is generally detectable in upper and lower  respiratory specimens dur ing the acute phase of infection.  Positive  results are indicative of active infection with SARS-CoV-2.  Clinical  correlation with patient  history and other diagnostic information is  necessary to determine patient infection status.  Positive results do  not rule out bacterial infection or co-infection with other viruses. If result is PRESUMPTIVE POSTIVE SARS-CoV-2 nucleic acids MAY BE PRESENT.   A presumptive positive result was obtained on the submitted specimen  and confirmed on repeat testing.  While 2019 novel coronavirus  (SARS-CoV-2) nucleic acids may be present in the submitted sample  additional confirmatory testing may be necessary for epidemiological  and / or clinical management purposes  to differentiate between  SARS-CoV-2 and other Sarbecovirus currently known to infect humans.  If clinically indicated additional testing with an alternate test  methodology [phone]) is advised. The SARS-CoV-2 RNA is generally  detectable in upper and lower respiratory sp ecimens during the acute  phase of infection. The expected result is Negative. Fact Sheet for Patients:  StrictlyIdeas.no Fact Sheet for Healthcare Providers: BankingDealers.co.za This test is not yet approved or cleared by the [place] FDA and has been authorized for detection and/or diagnosis of SARS-CoV-2 by FDA under an Emergency Use Authorization (EUA).  This EUA will remain in effect (meaning this  test can be used) for the duration of the COVID-19 declaration under Section 564(b)(1) of the Act, 21 U.S.C. section 360bbb-3(b)(1), unless the authorization is terminated or revoked sooner. Performed at [facility], [address] [place], [place] [address]      Radiological Exams on Admission: Dg Tibia/fibula Left  Result Date: 10/21/2018 CLINICAL DATA:  Chronic lateral lower leg wound. EXAM: LEFT TIBIA AND FIBULA - 2 VIEW COMPARISON:  None. FINDINGS: There is no evidence of fracture or other focal bone lesions. No osseous destruction or periosteal reaction. Osteopenia. Mild diffuse soft tissue swelling. IMPRESSION: 1. Mild diffuse soft tissue swelling.  No acute osseous abnormality. Electronically Signed   By: Titus Dubin M.D.   On: 10/21/2018 15:39   Dg Foot Complete Left  Result Date: 10/21/2018 CLINICAL DATA:  Toe gangrene. EXAM: LEFT FOOT - COMPLETE 3+ VIEW COMPARISON:  None. FINDINGS: No osseous destruction or periosteal reaction. No acute fracture. Healed fracture deformity of the first proximal phalanx. Chronic midfoot collapse with prominent bony hypertrophy. Mild first MTP joint osteoarthritis. Osteopenia. Diffuse soft tissue swelling. IMPRESSION: 1. Diffuse soft tissue swelling. No radiographic evidence of osteomyelitis. 2. Neuropathic arthropathy with chronic midfoot collapse. Electronically Signed   By: Titus Dubin M.D.   On: 10/21/2018 15:38    Assessment/Plan: Principal Problem:   Gangrene of left foot ([place]) Active Problems:   Diabetes ([place])   Essential hypertension, benign   Acute anemia    This patient was discussed with the ED physician, including pertinent vitals, physical exam findings, labs, and imaging.  We also discussed care given by the ED provider.  1. Gangrene of left foot a. Admit to [facility].  I discussed the patient with Dr. Donzetta Matters of vascular surgery.  He has agreed to consult on the patient b. I have discussed with the patient that  there is high likelihood of him losing his foot and/or leg. c. ABI d. MRI of left foot e. Continue antibiotics for now f. Blood cultures pending g. Repeat CBC in the morning h. Wound care consult i. Hemoglobin A1c j. ESR, CRP -we will repeat these tomorrow morning  2. Diabetes a. Home insulin b. Sliding scale insulin c. CBGs before meals and nightly d. Hemoglobin A1c 3. Hypertension a. Continue home meds 4. Acute anemia a. Status post 2 units packed red blood cells b.  We will repeat CBC in the morning 5. History of GI bleed a. Hold anticoagulation for now  DVT prophylaxis: SCDs Consultants: Vascular surgery -I appreciate their input and assistance with this case Code Status: Full code Family Communication:   Disposition Plan: Pending   Truett Mainland, DO

## 2018-10-21 NOTE — ED Notes (Signed)
Patient son was given wheelchair at this time to take home.

## 2018-10-21 NOTE — ED Provider Notes (Signed)
Medical screening examination/treatment/procedure(s) were conducted as a shared visit with non-physician practitioner(s) and myself.  I personally evaluated the patient during the encounter.  None  Patient with diabetic foot.  Physical exam showed black toes and possible black areas on his foot.  Patient will be admitted to medicine.  I spoke to general surgery and it was felt the patient should be admitted for con hospital   Milton Ferguson, MD 10/21/18 1943

## 2018-10-22 ENCOUNTER — Encounter (HOSPITAL_COMMUNITY): Payer: Self-pay

## 2018-10-22 ENCOUNTER — Other Ambulatory Visit: Payer: Self-pay

## 2018-10-22 ENCOUNTER — Inpatient Hospital Stay (HOSPITAL_COMMUNITY): Payer: Medicare Other

## 2018-10-22 DIAGNOSIS — K219 Gastro-esophageal reflux disease without esophagitis: Secondary | ICD-10-CM

## 2018-10-22 DIAGNOSIS — E1169 Type 2 diabetes mellitus with other specified complication: Secondary | ICD-10-CM

## 2018-10-22 DIAGNOSIS — I96 Gangrene, not elsewhere classified: Secondary | ICD-10-CM

## 2018-10-22 DIAGNOSIS — E119 Type 2 diabetes mellitus without complications: Secondary | ICD-10-CM

## 2018-10-22 LAB — TYPE AND SCREEN
ABO/RH(D): A POS
ABO/RH(D): A POS
Antibody Screen: NEGATIVE
Antibody Screen: NEGATIVE
Unit division: 0
Unit division: 0

## 2018-10-22 LAB — GLUCOSE, CAPILLARY
Glucose-Capillary: 298 mg/dL — ABNORMAL HIGH (ref 70–99)
Glucose-Capillary: 186 mg/dL — ABNORMAL HIGH (ref 70–99)
Glucose-Capillary: 174 mg/dL — ABNORMAL HIGH (ref 70–99)
Glucose-Capillary: 135 mg/dL — ABNORMAL HIGH (ref 70–99)

## 2018-10-22 LAB — BPAM RBC
Blood Product Expiration Date: 202006292359
Blood Product Expiration Date: 202007012359
ISSUE DATE / TIME: 202006061740
ISSUE DATE / TIME: 202006062038
Unit Type and Rh: 6200
Unit Type and Rh: 6200

## 2018-10-22 LAB — COMPREHENSIVE METABOLIC PANEL
ALT: 14 U/L (ref 0–44)
AST: 11 U/L — ABNORMAL LOW (ref 15–41)
Albumin: 1.7 g/dL — ABNORMAL LOW (ref 3.5–5.0)
Alkaline Phosphatase: 115 U/L (ref 38–126)
Anion gap: 17 — ABNORMAL HIGH (ref 5–15)
BUN: 21 mg/dL (ref 8–23)
CO2: 12 mmol/L — ABNORMAL LOW (ref 22–32)
Calcium: 7.9 mg/dL — ABNORMAL LOW (ref 8.9–10.3)
Chloride: 102 mmol/L (ref 98–111)
Creatinine, Ser: 1.37 mg/dL — ABNORMAL HIGH (ref 0.61–1.24)
GFR calc Af Amer: >60 mL/min (ref >60)
GFR calc non Af Amer: 54 mL/min — ABNORMAL LOW (ref >60)
Glucose, Bld: 307 mg/dL — ABNORMAL HIGH (ref 70–99)
Potassium: 5.2 mmol/L — ABNORMAL HIGH (ref 3.5–5.1)
Sodium: 131 mmol/L — ABNORMAL LOW (ref 135–145)
Total Bilirubin: 1.2 mg/dL (ref 0.3–1.2)
Total Protein: 5.7 g/dL — ABNORMAL LOW (ref 6.5–8.1)

## 2018-10-22 LAB — HEMOGLOBIN A1C
Hgb A1c MFr Bld: 7.7 % — ABNORMAL HIGH (ref 4.8–5.6)
Mean Plasma Glucose: 174.29 mg/dL

## 2018-10-22 LAB — PREALBUMIN: Prealbumin: 6.8 mg/dL — ABNORMAL LOW (ref 18–38)

## 2018-10-22 LAB — CBC
HCT: 26.5 % — ABNORMAL LOW (ref 39.0–52.0)
Hemoglobin: 8.7 g/dL — ABNORMAL LOW (ref 13.0–17.0)
MCH: 27.7 pg (ref 26.0–34.0)
MCHC: 32.8 g/dL (ref 30.0–36.0)
MCV: 84.4 fL (ref 80.0–100.0)
Platelets: 712 10*3/uL — ABNORMAL HIGH (ref 150–400)
RBC: 3.14 MIL/uL — ABNORMAL LOW (ref 4.22–5.81)
RDW: 13.8 % (ref 11.5–15.5)
WBC: 19.9 10*3/uL — ABNORMAL HIGH (ref 4.0–10.5)
nRBC: 0 % (ref 0.0–0.2)

## 2018-10-22 LAB — SURGICAL PCR SCREEN
MRSA, PCR: NEGATIVE
Staphylococcus aureus: POSITIVE — AB

## 2018-10-22 LAB — SEDIMENTATION RATE: Sed Rate: 128 mm/hr — ABNORMAL HIGH (ref 0–16)

## 2018-10-22 LAB — ABO/RH: ABO/RH(D): A POS

## 2018-10-22 LAB — C-REACTIVE PROTEIN: CRP: 19.8 mg/dL — ABNORMAL HIGH (ref <1.0)

## 2018-10-22 LAB — HIV ANTIBODY (ROUTINE TESTING W REFLEX): HIV Screen 4th Generation wRfx: NONREACTIVE

## 2018-10-22 MED ORDER — GADOBUTROL 1 MMOL/ML IV SOLN
7.0000 mL | Freq: Once | INTRAVENOUS | Status: AC | PRN
  Administered 2018-10-22: 7 mL via INTRAVENOUS

## 2018-10-22 MED ORDER — COLLAGENASE 250 UNIT/GM EX OINT
TOPICAL_OINTMENT | Freq: Every day | CUTANEOUS | Status: DC
  Administered 2018-10-22 – 2018-10-25 (×4): via TOPICAL
  Filled 2018-10-22: qty 30

## 2018-10-22 NOTE — Progress Notes (Signed)
Patient states he hit the back of his right leg on something about a week ago. Covered wound with pink foam dressing.

## 2018-10-22 NOTE — Consult Note (Signed)
[facility] Nurse wound consult note Reason for Consult: Left LE is under care of Vascular Surgeon, Dr. Donzetta Matters.  Plan for amputation tomorrow.  [facility] Nurse is consulted for care recommendations for posterior right LE where a traumatic injury has resulted in a full thickness wound. Wound type:Trauma Pressure Injury POA: N/A Measurement:2.5cm x 1cm with depth not able to be determined due to the presence of nonviable tissue. Wound bed: Yellow slough and a small amount of necrotic black eschar at the most proximal end of wound. Drainage (amount, consistency, odor) Small amount, light yellow consistent with autolytically debriding nonviable tissue Periwound: Intact. Mild erythema. Dressing procedure/placement/frequency: I will provide Nursing with guidance for care using a soap and water cleanse followed by normal saline rinse.  This is to be followed by topical care instructions consisting of collagenase (Santyl) application topped with a dampened saline dressing. Daily changes are recommended for 21 days.   [facility] nursing team will not follow, but will remain available to this patient, the nursing and medical teams.  Please re-consult if needed. Thanks, Maudie Flakes, MSN, RN, [place], Arther Abbott  Pager# [phone]

## 2018-10-22 NOTE — H&P (View-Only) (Signed)
[facility] Consult    Reason for Consult: Ischemic left lower extremity Referring Physician: Dr. Sheran Lawless MRN #:  [id number]  History of Present Illness: This is a 63 y.o. male history of diabetes hypertension previously had DVT with pulmonary embolus treated with anticoagulation but has not been taking for some time.  Says he presented to the [facility] hospital approximately 5 or 6 weeks ago with pain in his left lower extremity that came on suddenly.  Since that time he has had persistent pain now with developing wounds.  He cannot feel his left leg from the mid lower leg down to the foot.  He has had scabs turning black including the heel and all of his toes.  States yesterday his pain became unbearable presented to [facility] for further evaluation.  He is a previous smoker quit approximately 10 years ago.  Lives at home does have a daughter and a son that are around.  He has been using a wheelchair for mobility given the pain in his left lower extremity.  Has not eaten today but did drink 1 cup of water for breakfast.  Past Medical History:  Diagnosis Date  . Diabetes mellitus without complication ([place])   . Hypertension   . Prostate hypertrophy   . Pulmonary emboli ([place]) 2018    Past Surgical History:  Procedure Laterality Date  . BIOPSY  04/06/2018   Procedure: BIOPSY;  Surgeon: Daneil Dolin, MD;  Location: [facility] SU[facility];  Service: Endoscopy;;  (gastric)  . COLONOSCOPY WITH PROPOFOL N/A 04/06/2018   Procedure: COLONOSCOPY WITH PROPOFOL;  Surgeon: Daneil Dolin, MD;  Location: [facility];  Service: Endoscopy;  Laterality: N/A;  . ESOPHAGOGASTRODUODENOSCOPY (EGD) WITH PROPOFOL N/A 04/06/2018   Procedure: ESOPHAGOGASTRODUODENOSCOPY (EGD) WITH PROPOFOL;  Surgeon: Daneil Dolin, MD;  Location: [facility];  Service: Endoscopy;  Laterality: N/A;  . INTRAMEDULLARY (IM) NAIL INTERTROCHANTERIC Right 12/06/2012   Procedure: INTRAMEDULLARY (IM) NAIL INTERTROCHANTRIC;  Surgeon: Carole Civil, MD;  Location: [facility] ORS;  Service: Orthopedics;  Laterality: Right;  after lunch   . KNEE SURGERY    . POLYPECTOMY  04/06/2018   Procedure: POLYPECTOMY;  Surgeon: Daneil Dolin, MD;  Location: [facility];  Service: Endoscopy;;  colon  . SHOULDER SURGERY      No Known Allergies  Prior to Admission medications   Medication Sig Start Date End Date Taking? Authorizing Provider  aspirin 81 MG tablet Take 1 tablet (81 mg total) by mouth daily. Resume on 04/12/18 04/06/18  Yes Kathie Dike, MD  B Complex-C (B-COMPLEX WITH VITAMIN C) tablet Take 1 tablet by mouth daily.   Yes [provider]  capsaicin (ZOSTRIX) 0.025 % cream Apply 1 application topically 3 (three) times daily.   Yes [provider]  Cholecalciferol (VITAMIN D PO) Take 1 tablet by mouth daily.   Yes [provider]  insulin glargine (LANTUS) 100 UNIT/ML injection Inject 15-25 Units into the skin every evening. Injects by personal sliding scale   Yes [provider]  NOVOLOG FLEXPEN 100 UNIT/ML FlexPen Inject 5 Units into the skin 3 (three) times daily with meals.  08/26/13  Yes [provider]  omeprazole (PRILOSEC) 40 MG capsule Take 40 mg by mouth every morning.   Yes [provider]  pregabalin (LYRICA) 100 MG capsule Take 100 mg by mouth 3 (three) times daily.   Yes [provider]    Social History   Socioeconomic History  . Marital status: Widowed  Spouse name: Not on file  . Number of children: Not on file  . Years of education: Not on file  . Highest education level: Not on file  Occupational History  . Not on file  Social Needs  . Financial resource strain: Not on file  . Food insecurity:    Worry: Not on file    Inability: Not on file  . Transportation needs:    Medical: Not on file    Non-medical: Not on file  Tobacco Use  . Smoking status: Former Smoker    Packs/day: 1.00    Types: E-cigarettes  . Smokeless tobacco: Never  Used  Substance and Sexual Activity  . Alcohol use: No  . Drug use: No  . Sexual activity: Not on file  Lifestyle  . Physical activity:    Days per week: Not on file    Minutes per session: Not on file  . Stress: Not on file  Relationships  . Social connections:    Talks on phone: Not on file    Gets together: Not on file    Attends religious service: Not on file    Active member of club or organization: Not on file    Attends meetings of clubs or organizations: Not on file    Relationship status: Not on file  . Intimate partner violence:    Fear of current or ex partner: Not on file    Emotionally abused: Not on file    Physically abused: Not on file    Forced sexual activity: Not on file  Other Topics Concern  . Not on file  Social History Narrative  . Not on file     Family History  Problem Relation Age of Onset  . Heart attack Father   . Colon polyps Neg Hx   . Colon cancer Neg Hx     ROS: Cardiovascular: []  chest pain/pressure []  palpitations []  SOB lying flat []  DOE []  pain in legs while walking [x]  pain in legs at rest []  pain in legs at night [x]  non-healing ulcers [x]  hx of DVT []  swelling in legs  Pulmonary: []  productive cough []  asthma/wheezing []  home O2  Neurologic: [x]  weakness in []  arms [x]  legs []  numbness in []  arms []  legs []  hx of CVA []  mini stroke [] difficulty speaking or slurred speech []  temporary loss of vision in one eye []  dizziness  Hematologic: []  hx of cancer []  bleeding problems []  problems with blood clotting easily  Endocrine:   [x]  diabetes []  thyroid disease  GI []  vomiting blood []  blood in stool  GU: []  CKD/renal failure []  HD--[]  M/W/F or []  T/T/S []  burning with urination []  blood in urine  Psychiatric: []  anxiety []  depression  Musculoskeletal: []  arthritis []  joint pain  Integumentary: []  rashes [x]  ulcers  Constitutional: []  fever []  chills   Physical Examination  Vitals:    10/22/18 0001 10/22/18 0513  BP:  (!) 141/88  Pulse: (!) 104 (!) 111  Resp:  20  Temp:  98.2 F (36.8 C)  SpO2:  98%   Body mass index is 23.71 kg/m.  General: No acute distress HENT: WNL, normocephalic Pulmonary: normal non-labored breathing Cardiac: Palpable common femoral pulses bilaterally, palpable right popliteal and dorsalis pedis pulses I cannot palpate a popliteal on the left Abdomen: soft, NT/ND, no masses Extremities: Left leg with gross ischemia is cool from mid leg down.  There is mottling and chronic ischemic changes of all of his toes as well  as his heel.  He has ulceration of the posterior calf as well as on the lateral knee.  He is grossly insensate from the mid calf down.  He cannot move his left ankle. He is tender to palpation at the knee level below that does not have much pain Neurologic: Lack of sensation left leg from mid calf.  Cannot move left ankle  CBC    Component Value Date/Time   WBC 19.9 (H) 10/22/2018 0033   RBC 3.14 (L) 10/22/2018 0033   HGB 8.7 (L) 10/22/2018 0033   HCT 26.5 (L) 10/22/2018 0033   HCT 25.4 (L) 04/26/2017 1827   PLT 712 (H) 10/22/2018 0033   MCV 84.4 10/22/2018 0033   MCH 27.7 10/22/2018 0033   MCHC 32.8 10/22/2018 0033   RDW 13.8 10/22/2018 0033   LYMPHSABS 1.3 10/21/2018 1432   MONOABS 0.9 10/21/2018 1432   EOSABS 0.1 10/21/2018 1432   BASOSABS 0.0 10/21/2018 1432    BMET    Component Value Date/Time   NA 131 (L) 10/22/2018 0616   K 5.2 (H) 10/22/2018 0616   CL 102 10/22/2018 0616   CO2 12 (L) 10/22/2018 0616   GLUCOSE 307 (H) 10/22/2018 0616   BUN 21 10/22/2018 0616   CREATININE 1.37 (H) 10/22/2018 0616   CALCIUM 7.9 (L) 10/22/2018 0616   GFRNONAA 54 (L) 10/22/2018 0616   GFRAA >60 10/22/2018 0616    COAGS: Lab Results  Component Value Date   INR 1.7 (H) 10/21/2018   INR 1.12 12/06/2012     Non-Invasive Vascular Imaging:   No noninvasive studies   ASSESSMENT/PLAN: This is a 64 y.o. male here with  subacute ischemia of his left lower extremity that happened suddenly approximately 5 to 6 weeks ago.  His leg is now grossly insensate with gross motor deficits from the knee down and is nonviable.  I discussed with the patient proceeding with left above-knee amputation to give him a chance that healing.  He will need rehab after this but says he will do this at home.  The etiology of his ischemia I am unsure of given that he does not appear to have any abdominal aneurysm from a CT in 2018 performed physical exam today and there is no right popliteal aneurysm.  I cannot feel popliteal pulse on the left there is also no fullness to suggest popliteal aneurysm.  Does have a history of DVT could theoretically have paradoxical embolus to his left lower extremity.  No history of atrial fibrillation or palpitations of his heart.  Whenever the etiology does need above-knee amputation on the left for nonviable extremity.  I discussed proceeding possibly today but he did drink water this morning.  We will get him scheduled for tomorrow.  N.p.o. past midnight tonight.  Trentyn Boisclair C. Donzetta Matters, MD [facility] Office: [phone] Pager: [phone]

## 2018-10-22 NOTE — Plan of Care (Signed)

## 2018-10-22 NOTE — Progress Notes (Signed)
PROGRESS NOTE    Daniel Campos  XNT:[id number] DOB: 1955/12/16 DOA: 10/21/2018 PCP: [name]     Brief Narrative:  63 y.o. male with a history of diabetes, hypertension, PE in 2018 not currently on anticoagulation.  Patient presents with approximately 3 weeks of nonhealing ulcerations to his left leg and foot.  Initially, he had some pain in his calf and was seen at the [facility] office.  He had an ultrasound that was negative for blood clot and was sent home.  Patient continued to have multiple sores appearing to the left leg and foot that scabbed and turned black.  His symptoms progressed to having blackness on his toes.  No palliating or provoking factors.  Denies chest pain, shortness of breath, dizziness, lightheadedness.  Emergency Department Course: White count elevated to 18.  Hemoglobin 6.  Blood cultures were obtained and the patient was started on vancomycin.  Ceftriaxone and Flagyl were added.  Patient tested negative for COVID.  Patient received 2 units of blood in the ED. [facility] called to admit patient for further evaluation and management of Left loer extremity gangrene with superimposed cellulitis.    Assessment & Plan: 1-Gangrene of left foot [facility]): with superimposed cellulitis -no fever -follow vascular surgery rec's -WBC's trending up -continue IV antibiotics and supportive care -will need left BKA (planned for tomorrow 10/23/18)  2-acute anemia -with recent GIB -Hgb in the 6 range on admission -2 units of PRBC's transfused  -will follow Hgb trend  -no overt bleeding appreciated.  3-Diabetes [facility]): with PVD and neuropathy -continue SSI -follow CBG's and adjust hypoglycemic regimen as needed -continue lyrica  4-Essential hypertension, benign -currently stable and well controlled -continue current antihypertensive regimen   5-History of pulmonary embolus (PE) -no on anticoagulation currently -patient reported completing treatment. -denies CP and  expressed no SOB.  6-GERD -continue PPI   DVT prophylaxis: SCD's Code Status: Full Code. Family Communication: no family at bedside  Disposition Plan: remains in patient, continue IV antibiotics, continue PRN analgesics, follow clinical response and vascular surgery recommendations  Consultants:   Vascular surgery   Procedures:   See below for x-ray reports  Left BKA (planning for 10/23/18)  Antimicrobials:  Anti-infectives (From admission, onward)   Start     Dose/Rate Route Frequency Ordered Stop   10/22/18 1200  vancomycin (VANCOCIN) 1,500 mg in sodium chloride 0.9 % 500 mL IVPB     1,500 mg 250 mL/hr over 120 Minutes Intravenous Every 24 hours 10/21/18 2359     10/21/18 2100  cefTRIAXone (ROCEPHIN) 1 g in sodium chloride 0.9 % 100 mL IVPB     1 g 200 mL/hr over 30 Minutes Intravenous Every 24 hours 10/21/18 1937     10/21/18 2000  metroNIDAZOLE (FLAGYL) IVPB 500 mg     500 mg 100 mL/hr over 60 Minutes Intravenous Every 8 hours 10/21/18 1937     10/21/18 1415  vancomycin (VANCOCIN) IVPB 1000 mg/200 mL premix     1,000 mg 200 mL/hr over 60 Minutes Intravenous  Once 10/21/18 1405 10/21/18 1725       Subjective: Afebrile, no CP or SOB. Reporting LLE pain.  Objective: Vitals:   10/21/18 2106 10/21/18 2235 10/22/18 0001 10/22/18 0513  BP:  (!) 142/91  (!) 141/88  Pulse: (!) 111 (!) 118 (!) 104 (!) 111  Resp: 17 20  20   Temp: 98.3 F (36.8 C) 98.4 F (36.9 C)  98.2 F (36.8 C)  TempSrc: Oral Oral  Oral  SpO2: 94% 99%  98%  Weight:      Height:        Intake/Output Summary (Last 24 hours) at 10/22/2018 1056 Last data filed at 10/22/2018 0900 Gross per 24 hour  Intake 1409.46 ml  Output -  Net 1409.46 ml   Filed Weights   10/21/18 1336  Weight: 77.1 kg    Examination: General exam: Alert, awake, oriented x 3; denies CP and SOB; reported pain in his LLE. No fever. Respiratory system: Clear to auscultation. Respiratory effort normal. Cardiovascular  system:RRR. No murmurs, rubs, gallops. Gastrointestinal system: Abdomen is nondistended, soft and nontender. No organomegaly or masses felt. Normal bowel sounds heard. Central nervous system: Alert and oriented. No focal neurological deficits. Extremities/skin: LLE with swelling and erythema, surrounding wet gangrene changes in his foot, toes, heel and mid leg. Felt cooler than RLE on exam. Psychiatry: Judgement and insight appear normal. Mood & affect appropriate.    Data Reviewed: I have personally reviewed following labs and imaging studies  CBC: Recent Labs  Lab 10/21/18 1432 10/22/18 0033  WBC 18.6* 19.9*  NEUTROABS 16.2*  --   HGB 6.0* 8.7*  HCT 19.0* 26.5*  MCV 84.4 84.4  PLT 720* 542*   Basic Metabolic Panel: Recent Labs  Lab 10/21/18 1432 10/22/18 0616  NA 131* 131*  K 5.0 5.2*  CL 101 102  CO2 20* 12*  GLUCOSE 194* 307*  BUN 18 21  CREATININE 1.16 1.37*  CALCIUM 8.0* 7.9*   GFR: Estimated Creatinine Clearance: 58 mL/min (A) (by C-G formula based on SCr of 1.37 mg/dL (H)).   Liver Function Tests: Recent Labs  Lab 10/22/18 0616  AST 11*  ALT 14  ALKPHOS 115  BILITOT 1.2  PROT 5.7*  ALBUMIN 1.7*   Coagulation Profile: Recent Labs  Lab 10/21/18 1432  INR 1.7*   HbA1C: Recent Labs    10/22/18 0036  HGBA1C 7.7*   CBG: Recent Labs  Lab 10/21/18 1350 10/21/18 2105 10/21/18 2247 10/22/18 0843  GLUCAP 166* 219* 225* 298*   Urine analysis:    Component Value Date/Time   COLORURINE YELLOW 04/26/2017 [place] 04/26/2017 1352   LABSPEC 1.015 04/26/2017 1352   PHURINE 5.0 04/26/2017 1352   GLUCOSEU 150 (A) 04/26/2017 1352   HGBUR SMALL (A) 04/26/2017 1352   BILIRUBINUR NEGATIVE 04/26/2017 1352   KETONESUR NEGATIVE 04/26/2017 1352   PROTEINUR NEGATIVE 04/26/2017 1352   UROBILINOGEN 0.2 11/16/2010 1144   NITRITE NEGATIVE 04/26/2017 1352   LEUKOCYTESUR NEGATIVE 04/26/2017 1352    Recent Results (from the past 240 hour(s))   Blood culture (routine x 2)     Status: None (Preliminary result)   Collection Time: 10/21/18  2:32 PM  Result Value Ref Range Status   Specimen Description LEFT ANTECUBITAL  Final   Special Requests   Final    BOTTLES DRAWN AEROBIC AND ANAEROBIC Blood Culture adequate volume   Culture   Final    NO GROWTH < 24 HOURS Performed at [facility], [address], [place], [place] [address]    Report Status PENDING  Incomplete  Blood culture (routine x 2)     Status: None (Preliminary result)   Collection Time: 10/21/18  2:34 PM  Result Value Ref Range Status   Specimen Description BLOOD LEFT ARM  Final   Special Requests   Final    BOTTLES DRAWN AEROBIC AND ANAEROBIC Blood Culture adequate volume   Culture   Final    NO GROWTH <  46 HOURS Performed at [facility], [address] [place], [place] [address]    Report Status PENDING  Incomplete  SARS Coronavirus 2 (CEPHEID - Performed in [place] hospital lab), Hosp Order     Status: None   Collection Time: 10/21/18  3:38 PM  Result Value Ref Range Status   SARS Coronavirus 2 NEGATIVE NEGATIVE Final    Comment: (NOTE) If result is NEGATIVE SARS-CoV-2 target nucleic acids are NOT DETECTED. The SARS-CoV-2 RNA is generally detectable in upper and lower  respiratory specimens during the acute phase of infection. The lowest  concentration of SARS-CoV-2 viral copies this assay can detect is 250  copies / mL. A negative result does not preclude SARS-CoV-2 infection  and should not be used as the sole basis for treatment or other  patient management decisions.  A negative result may occur with  improper specimen collection / handling, submission of specimen other  than nasopharyngeal swab, presence of viral mutation(s) within the  areas targeted by this assay, and inadequate number of viral copies  (<250 copies / mL). A negative result must be combined with clinical  observations, patient history, and epidemiological information. If  result is POSITIVE SARS-CoV-2 target nucleic acids are DETECTED. The SARS-CoV-2 RNA is generally detectable in upper and lower  respiratory specimens dur ing the acute phase of infection.  Positive  results are indicative of active infection with SARS-CoV-2.  Clinical  correlation with patient history and other diagnostic information is  necessary to determine patient infection status.  Positive results do  not rule out bacterial infection or co-infection with other viruses. If result is PRESUMPTIVE POSTIVE SARS-CoV-2 nucleic acids MAY BE PRESENT.   A presumptive positive result was obtained on the submitted specimen  and confirmed on repeat testing.  While 2019 novel coronavirus  (SARS-CoV-2) nucleic acids may be present in the submitted sample  additional confirmatory testing may be necessary for epidemiological  and / or clinical management purposes  to differentiate between  SARS-CoV-2 and other Sarbecovirus currently known to infect humans.  If clinically indicated additional testing with an alternate test  methodology [id number]) is advised. The SARS-CoV-2 RNA is generally  detectable in upper and lower respiratory sp ecimens during the acute  phase of infection. The expected result is Negative. Fact Sheet for Patients:  StrictlyIdeas.no Fact Sheet for Healthcare Providers: BankingDealers.co.za This test is not yet approved or cleared by the [place] FDA and has been authorized for detection and/or diagnosis of SARS-CoV-2 by FDA under an Emergency Use Authorization (EUA).  This EUA will remain in effect (meaning this test can be used) for the duration of the COVID-19 declaration under Section 564(b)(1) of the Act, 21 U.S.C. section 360bbb-3(b)(1), unless the authorization is terminated or revoked sooner. Performed at [facility], [address], [place], [place] [address]     Radiology Studies: Dg Tibia/fibula Left  Result  Date: 10/21/2018 CLINICAL DATA:  Chronic lateral lower leg wound. EXAM: LEFT TIBIA AND FIBULA - 2 VIEW COMPARISON:  None. FINDINGS: There is no evidence of fracture or other focal bone lesions. No osseous destruction or periosteal reaction. Osteopenia. Mild diffuse soft tissue swelling. IMPRESSION: 1. Mild diffuse soft tissue swelling.  No acute osseous abnormality. Electronically Signed   By: Titus Dubin M.D.   On: 10/21/2018 15:39   Dg Foot Complete Left  Result Date: 10/21/2018 CLINICAL DATA:  Toe gangrene. EXAM: LEFT FOOT - COMPLETE 3+ VIEW COMPARISON:  None. FINDINGS: No osseous destruction or periosteal reaction.  No acute fracture. Healed fracture deformity of the first proximal phalanx. Chronic midfoot collapse with prominent bony hypertrophy. Mild first MTP joint osteoarthritis. Osteopenia. Diffuse soft tissue swelling. IMPRESSION: 1. Diffuse soft tissue swelling. No radiographic evidence of osteomyelitis. 2. Neuropathic arthropathy with chronic midfoot collapse. Electronically Signed   By: Titus Dubin M.D.   On: 10/21/2018 15:38    Scheduled Meds: . aspirin EC  81 mg Oral Daily  . insulin aspart  0-15 Units Subcutaneous TID WC  . insulin aspart  0-5 Units Subcutaneous QHS  . insulin aspart  5 Units Subcutaneous TID WC  . insulin glargine  20 Units Subcutaneous QPM  . pantoprazole  40 mg Oral Daily  . pregabalin  100 mg Oral TID   Continuous Infusions: . cefTRIAXone (ROCEPHIN)  IV Stopped (10/21/18 2334)  . metronidazole 500 mg (10/22/18 0310)  . vancomycin       LOS: 1 day   Time spent: 35 minutes. Greater than 50% of this time was spent in direct contact with the patient, coordinating care and discussing relevant ongoing clinical issues, including discussion about LLE gangrene process and findings of superimposed cellulitis. Plan of care discussed with patient and staff; al questions answered. Despite not current notes in Epic, patient reported discussion with vascular surgeon  and the plan for Left BKA in am.     Barton Dubois, MD [facility] Pager [phone]  10/22/2018, 10:56 AM

## 2018-10-22 NOTE — Consult Note (Signed)
[facility] Consult    Reason for Consult: Ischemic left lower extremity Referring Physician: Dr. Sheran Lawless MRN #:  [id number]  History of Present Illness: This is a 63 y.o. male history of diabetes hypertension previously had DVT with pulmonary embolus treated with anticoagulation but has not been taking for some time.  Says he presented to the [facility] hospital approximately 5 or 6 weeks ago with pain in his left lower extremity that came on suddenly.  Since that time he has had persistent pain now with developing wounds.  He cannot feel his left leg from the mid lower leg down to the foot.  He has had scabs turning black including the heel and all of his toes.  States yesterday his pain became unbearable presented to [facility] for further evaluation.  He is a previous smoker quit approximately 10 years ago.  Lives at home does have a daughter and a son that are around.  He has been using a wheelchair for mobility given the pain in his left lower extremity.  Has not eaten today but did drink 1 cup of water for breakfast.  Past Medical History:  Diagnosis Date  . Diabetes mellitus without complication ([place])   . Hypertension   . Prostate hypertrophy   . Pulmonary emboli ([place]) 2018    Past Surgical History:  Procedure Laterality Date  . BIOPSY  04/06/2018   Procedure: BIOPSY;  Surgeon: Daneil Dolin, MD;  Location: [facility];  Service: Endoscopy;;  (gastric)  . COLONOSCOPY WITH PROPOFOL N/A 04/06/2018   Procedure: COLONOSCOPY WITH PROPOFOL;  Surgeon: Daneil Dolin, MD;  Location: [facility];  Service: Endoscopy;  Laterality: N/A;  . ESOPHAGOGASTRODUODENOSCOPY (EGD) WITH PROPOFOL N/A 04/06/2018   Procedure: ESOPHAGOGASTRODUODENOSCOPY (EGD) WITH PROPOFOL;  Surgeon: Daneil Dolin, MD;  Location: [facility];  Service: Endoscopy;  Laterality: N/A;  . INTRAMEDULLARY (IM) NAIL INTERTROCHANTERIC Right 12/06/2012   Procedure: INTRAMEDULLARY (IM) NAIL INTERTROCHANTRIC;  Surgeon: Carole Civil, MD;  Location: [facility] ORS;  Service: Orthopedics;  Laterality: Right;  after lunch   . KNEE SURGERY    . POLYPECTOMY  04/06/2018   Procedure: POLYPECTOMY;  Surgeon: Daneil Dolin, MD;  Location: [facility];  Service: Endoscopy;;  colon  . SHOULDER SURGERY      No Known Allergies  Prior to Admission medications   Medication Sig Start Date End Date Taking? Authorizing Provider  aspirin 81 MG tablet Take 1 tablet (81 mg total) by mouth daily. Resume on 04/12/18 04/06/18  Yes Kathie Dike, MD  B Complex-C (B-COMPLEX WITH VITAMIN C) tablet Take 1 tablet by mouth daily.   Yes [provider]  capsaicin (ZOSTRIX) 0.025 % cream Apply 1 application topically 3 (three) times daily.   Yes [provider]  Cholecalciferol (VITAMIN D PO) Take 1 tablet by mouth daily.   Yes [provider]  insulin glargine (LANTUS) 100 UNIT/ML injection Inject 15-25 Units into the skin every evening. Injects by personal sliding scale   Yes [provider]  NOVOLOG FLEXPEN 100 UNIT/ML FlexPen Inject 5 Units into the skin 3 (three) times daily with meals.  08/26/13  Yes [provider]  omeprazole (PRILOSEC) 40 MG capsule Take 40 mg by mouth every morning.   Yes [provider]  pregabalin (LYRICA) 100 MG capsule Take 100 mg by mouth 3 (three) times daily.   Yes [provider]    Social History   Socioeconomic History  . Marital status: Widowed  Spouse name: Not on file  . Number of children: Not on file  . Years of education: Not on file  . Highest education level: Not on file  Occupational History  . Not on file  Social Needs  . Financial resource strain: Not on file  . Food insecurity:    Worry: Not on file    Inability: Not on file  . Transportation needs:    Medical: Not on file    Non-medical: Not on file  Tobacco Use  . Smoking status: Former Smoker    Packs/day: 1.00    Types: E-cigarettes  . Smokeless tobacco: Never  Used  Substance and Sexual Activity  . Alcohol use: No  . Drug use: No  . Sexual activity: Not on file  Lifestyle  . Physical activity:    Days per week: Not on file    Minutes per session: Not on file  . Stress: Not on file  Relationships  . Social connections:    Talks on phone: Not on file    Gets together: Not on file    Attends religious service: Not on file    Active member of club or organization: Not on file    Attends meetings of clubs or organizations: Not on file    Relationship status: Not on file  . Intimate partner violence:    Fear of current or ex partner: Not on file    Emotionally abused: Not on file    Physically abused: Not on file    Forced sexual activity: Not on file  Other Topics Concern  . Not on file  Social History Narrative  . Not on file     Family History  Problem Relation Age of Onset  . Heart attack Father   . Colon polyps Neg Hx   . Colon cancer Neg Hx     ROS: Cardiovascular: []  chest pain/pressure []  palpitations []  SOB lying flat []  DOE []  pain in legs while walking [x]  pain in legs at rest []  pain in legs at night [x]  non-healing ulcers [x]  hx of DVT []  swelling in legs  Pulmonary: []  productive cough []  asthma/wheezing []  home O2  Neurologic: [x]  weakness in []  arms [x]  legs []  numbness in []  arms []  legs []  hx of CVA []  mini stroke [] difficulty speaking or slurred speech []  temporary loss of vision in one eye []  dizziness  Hematologic: []  hx of cancer []  bleeding problems []  problems with blood clotting easily  Endocrine:   [x]  diabetes []  thyroid disease  GI []  vomiting blood []  blood in stool  GU: []  CKD/renal failure []  HD--[]  M/W/F or []  T/T/S []  burning with urination []  blood in urine  Psychiatric: []  anxiety []  depression  Musculoskeletal: []  arthritis []  joint pain  Integumentary: []  rashes [x]  ulcers  Constitutional: []  fever []  chills   Physical Examination  Vitals:    10/22/18 0001 10/22/18 0513  BP:  (!) 141/88  Pulse: (!) 104 (!) 111  Resp:  20  Temp:  98.2 F (36.8 C)  SpO2:  98%   Body mass index is 23.71 kg/m.  General: No acute distress HENT: WNL, normocephalic Pulmonary: normal non-labored breathing Cardiac: Palpable common femoral pulses bilaterally, palpable right popliteal and dorsalis pedis pulses I cannot palpate a popliteal on the left Abdomen: soft, NT/ND, no masses Extremities: Left leg with gross ischemia is cool from mid leg down.  There is mottling and chronic ischemic changes of all of his toes as well  as his heel.  He has ulceration of the posterior calf as well as on the lateral knee.  He is grossly insensate from the mid calf down.  He cannot move his left ankle. He is tender to palpation at the knee level below that does not have much pain Neurologic: Lack of sensation left leg from mid calf.  Cannot move left ankle  CBC    Component Value Date/Time   WBC 19.9 (H) 10/22/2018 0033   RBC 3.14 (L) 10/22/2018 0033   HGB 8.7 (L) 10/22/2018 0033   HCT 26.5 (L) 10/22/2018 0033   HCT 25.4 (L) 04/26/2017 1827   PLT 712 (H) 10/22/2018 0033   MCV 84.4 10/22/2018 0033   MCH 27.7 10/22/2018 0033   MCHC 32.8 10/22/2018 0033   RDW 13.8 10/22/2018 0033   LYMPHSABS 1.3 10/21/2018 1432   MONOABS 0.9 10/21/2018 1432   EOSABS 0.1 10/21/2018 1432   BASOSABS 0.0 10/21/2018 1432    BMET    Component Value Date/Time   NA 131 (L) 10/22/2018 0616   K 5.2 (H) 10/22/2018 0616   CL 102 10/22/2018 0616   CO2 12 (L) 10/22/2018 0616   GLUCOSE 307 (H) 10/22/2018 0616   BUN 21 10/22/2018 0616   CREATININE 1.37 (H) 10/22/2018 0616   CALCIUM 7.9 (L) 10/22/2018 0616   GFRNONAA 54 (L) 10/22/2018 0616   GFRAA >60 10/22/2018 0616    COAGS: Lab Results  Component Value Date   INR 1.7 (H) 10/21/2018   INR 1.12 12/06/2012     Non-Invasive Vascular Imaging:   No noninvasive studies   ASSESSMENT/PLAN: This is a 63 y.o. male here with  subacute ischemia of his left lower extremity that happened suddenly approximately 5 to 6 weeks ago.  His leg is now grossly insensate with gross motor deficits from the knee down and is nonviable.  I discussed with the patient proceeding with left above-knee amputation to give him a chance that healing.  He will need rehab after this but says he will do this at home.  The etiology of his ischemia I am unsure of given that he does not appear to have any abdominal aneurysm from a CT in 2018 performed physical exam today and there is no right popliteal aneurysm.  I cannot feel popliteal pulse on the left there is also no fullness to suggest popliteal aneurysm.  Does have a history of DVT could theoretically have paradoxical embolus to his left lower extremity.  No history of atrial fibrillation or palpitations of his heart.  Whenever the etiology does need above-knee amputation on the left for nonviable extremity.  I discussed proceeding possibly today but he did drink water this morning.  We will get him scheduled for tomorrow.  N.p.o. past midnight tonight.  Larkyn Greenberger C. Donzetta Matters, MD [facility] Office: [phone] Pager: [phone]

## 2018-10-22 NOTE — Plan of Care (Signed)

## 2018-10-23 ENCOUNTER — Inpatient Hospital Stay (HOSPITAL_COMMUNITY): Payer: Medicare Other | Admitting: Certified Registered Nurse Anesthetist

## 2018-10-23 ENCOUNTER — Encounter (HOSPITAL_COMMUNITY): Payer: Self-pay | Admitting: Certified Registered Nurse Anesthetist

## 2018-10-23 ENCOUNTER — Encounter (HOSPITAL_COMMUNITY)
Admission: EM | Disposition: A | Discharge status: Rehabilitation Facility | Payer: Self-pay | Source: Home / Self Care | Attending: Internal Medicine

## 2018-10-23 DIAGNOSIS — R195 Other fecal abnormalities: Secondary | ICD-10-CM

## 2018-10-23 HISTORY — PX: AMPUTATION: SHX166

## 2018-10-23 LAB — BASIC METABOLIC PANEL
Anion gap: 11 (ref 5–15)
BUN: 16 mg/dL (ref 8–23)
CO2: 17 mmol/L — ABNORMAL LOW (ref 22–32)
Calcium: 8.1 mg/dL — ABNORMAL LOW (ref 8.9–10.3)
Chloride: 106 mmol/L (ref 98–111)
Creatinine, Ser: 1.13 mg/dL (ref 0.61–1.24)
GFR calc Af Amer: >60 mL/min (ref >60)
GFR calc non Af Amer: >60 mL/min (ref >60)
Glucose, Bld: 170 mg/dL — ABNORMAL HIGH (ref 70–99)
Potassium: 4.7 mmol/L (ref 3.5–5.1)
Sodium: 134 mmol/L — ABNORMAL LOW (ref 135–145)

## 2018-10-23 LAB — GLUCOSE, CAPILLARY
Glucose-Capillary: 200 mg/dL — ABNORMAL HIGH (ref 70–99)
Glucose-Capillary: 195 mg/dL — ABNORMAL HIGH (ref 70–99)
Glucose-Capillary: 161 mg/dL — ABNORMAL HIGH (ref 70–99)
Glucose-Capillary: 185 mg/dL — ABNORMAL HIGH (ref 70–99)
Glucose-Capillary: 221 mg/dL — ABNORMAL HIGH (ref 70–99)
Glucose-Capillary: 213 mg/dL — ABNORMAL HIGH (ref 70–99)

## 2018-10-23 LAB — CBC
HCT: 26.5 % — ABNORMAL LOW (ref 39.0–52.0)
Hemoglobin: 8.6 g/dL — ABNORMAL LOW (ref 13.0–17.0)
MCH: 27.5 pg (ref 26.0–34.0)
MCHC: 32.5 g/dL (ref 30.0–36.0)
MCV: 84.7 fL (ref 80.0–100.0)
Platelets: 751 10*3/uL — ABNORMAL HIGH (ref 150–400)
RBC: 3.13 MIL/uL — ABNORMAL LOW (ref 4.22–5.81)
RDW: 14.6 % (ref 11.5–15.5)
WBC: 16.1 10*3/uL — ABNORMAL HIGH (ref 4.0–10.5)
nRBC: 0 % (ref 0.0–0.2)

## 2018-10-23 SURGERY — AMPUTATION, ABOVE KNEE
Anesthesia: General | Laterality: Left

## 2018-10-23 MED ORDER — MUPIROCIN 2 % EX OINT
1.0000 "application " | TOPICAL_OINTMENT | Freq: Two times a day (BID) | CUTANEOUS | Status: DC
  Administered 2018-10-23 – 2018-10-25 (×5): 1 via NASAL
  Filled 2018-10-23: qty 22

## 2018-10-23 MED ORDER — ONDANSETRON HCL 4 MG/2ML IJ SOLN
INTRAMUSCULAR | Status: DC | PRN
  Administered 2018-10-23 (×2): 4 mg via INTRAVENOUS

## 2018-10-23 MED ORDER — ACETAMINOPHEN 500 MG PO TABS
1000.0000 mg | ORAL_TABLET | Freq: Once | ORAL | Status: AC
  Administered 2018-10-23: 1000 mg via ORAL

## 2018-10-23 MED ORDER — PHENOL 1.4 % MT LIQD: 1.0000 | OROMUCOSAL | Status: DC | PRN

## 2018-10-23 MED ORDER — SODIUM CHLORIDE 0.9 % IV SOLN
INTRAVENOUS | Status: DC
  Administered 2018-10-23 – 2018-10-25 (×4): via INTRAVENOUS

## 2018-10-23 MED ORDER — ALUM & MAG HYDROXIDE-SIMETH 200-200-20 MG/5ML PO SUSP: 15.0000 mL | ORAL | Status: DC | PRN

## 2018-10-23 MED ORDER — SENNOSIDES-DOCUSATE SODIUM 8.6-50 MG PO TABS: 1.0000 | ORAL_TABLET | Freq: Every evening | ORAL | Status: DC | PRN

## 2018-10-23 MED ORDER — HYDROMORPHONE HCL 1 MG/ML IJ SOLN
0.2500 mg | INTRAMUSCULAR | Status: DC | PRN
  Administered 2018-10-23 (×4): 0.5 mg via INTRAVENOUS

## 2018-10-23 MED ORDER — GUAIFENESIN-DM 100-10 MG/5ML PO SYRP: 15.0000 mL | ORAL_SOLUTION | ORAL | Status: DC | PRN

## 2018-10-23 MED ORDER — PHENYLEPHRINE 40 MCG/ML (10ML) SYRINGE FOR IV PUSH (FOR BLOOD PRESSURE SUPPORT)
PREFILLED_SYRINGE | INTRAVENOUS | Status: DC | PRN
  Administered 2018-10-23 (×2): 80 ug via INTRAVENOUS
  Administered 2018-10-23: 160 ug via INTRAVENOUS
  Administered 2018-10-23 (×2): 80 ug via INTRAVENOUS
  Administered 2018-10-23 (×2): 120 ug via INTRAVENOUS

## 2018-10-23 MED ORDER — ACETAMINOPHEN 500 MG PO TABS
ORAL_TABLET | ORAL | Status: AC
  Administered 2018-10-23: 11:00:00 1000 mg via ORAL
  Filled 2018-10-23: qty 2

## 2018-10-23 MED ORDER — PROPOFOL 10 MG/ML IV BOLUS
INTRAVENOUS | Status: DC | PRN
  Administered 2018-10-23: 130 mg via INTRAVENOUS

## 2018-10-23 MED ORDER — LACTATED RINGERS IV SOLN
INTRAVENOUS | Status: DC
  Administered 2018-10-23: 11:00:00 via INTRAVENOUS

## 2018-10-23 MED ORDER — FENTANYL CITRATE (PF) 250 MCG/5ML IJ SOLN
INTRAMUSCULAR | Status: AC
  Filled 2018-10-23: qty 5

## 2018-10-23 MED ORDER — PHENYLEPHRINE 40 MCG/ML (10ML) SYRINGE FOR IV PUSH (FOR BLOOD PRESSURE SUPPORT)
PREFILLED_SYRINGE | INTRAVENOUS | Status: AC
  Filled 2018-10-23: qty 10

## 2018-10-23 MED ORDER — GLYCOPYRROLATE 0.2 MG/ML IJ SOLN
INTRAMUSCULAR | Status: DC | PRN
  Administered 2018-10-23: 0.2 mg via INTRAVENOUS

## 2018-10-23 MED ORDER — ADULT MULTIVITAMIN W/MINERALS CH
1.0000 | ORAL_TABLET | Freq: Every day | ORAL | Status: DC
  Administered 2018-10-24 – 2018-10-25 (×2): 1 via ORAL
  Filled 2018-10-23 (×2): qty 1

## 2018-10-23 MED ORDER — METOPROLOL TARTRATE 5 MG/5ML IV SOLN: 2.0000 mg | INTRAVENOUS | Status: DC | PRN

## 2018-10-23 MED ORDER — ENSURE MAX PROTEIN PO LIQD
11.0000 [oz_av] | Freq: Every day | ORAL | Status: DC
  Administered 2018-10-23: 11 [oz_av] via ORAL
  Filled 2018-10-23 (×3): qty 330

## 2018-10-23 MED ORDER — JUVEN PO PACK
1.0000 | PACK | Freq: Two times a day (BID) | ORAL | Status: DC
  Administered 2018-10-24 – 2018-10-25 (×3): 1 via ORAL
  Filled 2018-10-23 (×4): qty 1

## 2018-10-23 MED ORDER — LIDOCAINE 2% (20 MG/ML) 5 ML SYRINGE
INTRAMUSCULAR | Status: AC
  Filled 2018-10-23: qty 5

## 2018-10-23 MED ORDER — ACETAMINOPHEN 325 MG PO TABS
325.0000 mg | ORAL_TABLET | ORAL | Status: DC | PRN
  Administered 2018-10-24: 20:00:00 650 mg via ORAL
  Filled 2018-10-23: qty 2

## 2018-10-23 MED ORDER — KETAMINE HCL 10 MG/ML IJ SOLN
INTRAMUSCULAR | Status: DC | PRN
  Administered 2018-10-23: 20 mg via INTRAVENOUS
  Administered 2018-10-23: 30 mg via INTRAVENOUS

## 2018-10-23 MED ORDER — HYDROMORPHONE HCL 1 MG/ML IJ SOLN
0.5000 mg | INTRAMUSCULAR | Status: DC | PRN
  Administered 2018-10-23 – 2018-10-24 (×4): 1 mg via INTRAVENOUS
  Filled 2018-10-23 (×4): qty 1

## 2018-10-23 MED ORDER — MIDAZOLAM HCL 2 MG/2ML IJ SOLN
INTRAMUSCULAR | Status: AC
  Filled 2018-10-23: qty 2

## 2018-10-23 MED ORDER — LABETALOL HCL 5 MG/ML IV SOLN
10.0000 mg | INTRAVENOUS | Status: DC | PRN
  Filled 2018-10-23: qty 4

## 2018-10-23 MED ORDER — CEFAZOLIN SODIUM 1 G IJ SOLR
INTRAMUSCULAR | Status: AC
  Filled 2018-10-23: qty 10

## 2018-10-23 MED ORDER — HYDROMORPHONE HCL 1 MG/ML IJ SOLN
INTRAMUSCULAR | Status: AC
  Administered 2018-10-23: 15:00:00 0.5 mg via INTRAVENOUS
  Filled 2018-10-23: qty 1

## 2018-10-23 MED ORDER — EPHEDRINE SULFATE 50 MG/ML IJ SOLN
INTRAMUSCULAR | Status: DC | PRN
  Administered 2018-10-23: 10 mg via INTRAVENOUS
  Administered 2018-10-23: 5 mg via INTRAVENOUS

## 2018-10-23 MED ORDER — ACETAMINOPHEN 10 MG/ML IV SOLN
INTRAVENOUS | Status: DC | PRN
  Administered 2018-10-23: 1000 mg via INTRAVENOUS

## 2018-10-23 MED ORDER — HYDRALAZINE HCL 20 MG/ML IJ SOLN: 5.0000 mg | INTRAMUSCULAR | Status: DC | PRN

## 2018-10-23 MED ORDER — ACETAMINOPHEN 325 MG RE SUPP
325.0000 mg | RECTAL | Status: DC | PRN
  Filled 2018-10-23: qty 2

## 2018-10-23 MED ORDER — POTASSIUM CHLORIDE CRYS ER 20 MEQ PO TBCR: 20.0000 meq | EXTENDED_RELEASE_TABLET | Freq: Every day | ORAL | Status: DC | PRN

## 2018-10-23 MED ORDER — MIDAZOLAM HCL 5 MG/5ML IJ SOLN
INTRAMUSCULAR | Status: DC | PRN
  Administered 2018-10-23: 2 mg via INTRAVENOUS

## 2018-10-23 MED ORDER — SUCCINYLCHOLINE CHLORIDE 20 MG/ML IJ SOLN
INTRAMUSCULAR | Status: DC | PRN
  Administered 2018-10-23: 100 mg via INTRAVENOUS

## 2018-10-23 MED ORDER — HEPARIN SODIUM (PORCINE) 5000 UNIT/ML IJ SOLN
5000.0000 [IU] | Freq: Three times a day (TID) | INTRAMUSCULAR | Status: DC
  Administered 2018-10-24 – 2018-10-25 (×5): 5000 [IU] via SUBCUTANEOUS
  Filled 2018-10-23 (×5): qty 1

## 2018-10-23 MED ORDER — SODIUM CHLORIDE 0.9 % IV SOLN
INTRAVENOUS | Status: DC | PRN
  Administered 2018-10-23: 13:00:00 via INTRAVENOUS

## 2018-10-23 MED ORDER — SUCCINYLCHOLINE CHLORIDE 200 MG/10ML IV SOSY
PREFILLED_SYRINGE | INTRAVENOUS | Status: AC
  Filled 2018-10-23: qty 10

## 2018-10-23 MED ORDER — METRONIDAZOLE IN NACL 500-0.74 MG/100ML-% IV SOLN
500.0000 mg | INTRAVENOUS | Status: AC
  Administered 2018-10-23: 500 mg via INTRAVENOUS
  Filled 2018-10-23: qty 100

## 2018-10-23 MED ORDER — 0.9 % SODIUM CHLORIDE (POUR BTL) OPTIME
TOPICAL | Status: DC | PRN
  Administered 2018-10-23: 1000 mL

## 2018-10-23 MED ORDER — ONDANSETRON HCL 4 MG/2ML IJ SOLN
INTRAMUSCULAR | Status: AC
  Filled 2018-10-23: qty 2

## 2018-10-23 MED ORDER — DOCUSATE SODIUM 100 MG PO CAPS
100.0000 mg | ORAL_CAPSULE | Freq: Every day | ORAL | Status: DC
  Administered 2018-10-24 – 2018-10-25 (×2): 100 mg via ORAL
  Filled 2018-10-23 (×2): qty 1

## 2018-10-23 MED ORDER — BISACODYL 5 MG PO TBEC: 5.0000 mg | DELAYED_RELEASE_TABLET | Freq: Every day | ORAL | Status: DC | PRN

## 2018-10-23 MED ORDER — MAGNESIUM SULFATE 2 GM/50ML IV SOLN
2.0000 g | Freq: Every day | INTRAVENOUS | Status: DC | PRN
  Filled 2018-10-23: qty 50

## 2018-10-23 MED ORDER — CHLORHEXIDINE GLUCONATE CLOTH 2 % EX PADS
6.0000 | MEDICATED_PAD | Freq: Every day | CUTANEOUS | Status: DC
  Administered 2018-10-23 – 2018-10-25 (×3): 6 via TOPICAL

## 2018-10-23 MED ORDER — LIDOCAINE 2% (20 MG/ML) 5 ML SYRINGE
INTRAMUSCULAR | Status: DC | PRN
  Administered 2018-10-23: 60 mg via INTRAVENOUS

## 2018-10-23 MED ORDER — KETAMINE HCL 50 MG/5ML IJ SOSY
PREFILLED_SYRINGE | INTRAMUSCULAR | Status: AC
  Filled 2018-10-23: qty 5

## 2018-10-23 MED ORDER — EPHEDRINE 5 MG/ML INJ
INTRAVENOUS | Status: AC
  Filled 2018-10-23: qty 10

## 2018-10-23 MED ORDER — FENTANYL CITRATE (PF) 100 MCG/2ML IJ SOLN
INTRAMUSCULAR | Status: DC | PRN
  Administered 2018-10-23 (×2): 50 ug via INTRAVENOUS
  Administered 2018-10-23: 150 ug via INTRAVENOUS
  Administered 2018-10-23 (×2): 50 ug via INTRAVENOUS

## 2018-10-23 MED ORDER — HYDROMORPHONE HCL 1 MG/ML IJ SOLN
INTRAMUSCULAR | Status: AC
  Administered 2018-10-23: 14:00:00 0.5 mg via INTRAVENOUS
  Filled 2018-10-23: qty 1

## 2018-10-23 SURGICAL SUPPLY — 53 items
BANDAGE ACE 4X5 VEL STRL LF (GAUZE/BANDAGES/DRESSINGS) ×3 IMPLANT
BANDAGE ACE 6X5 VEL STRL LF (GAUZE/BANDAGES/DRESSINGS) ×3 IMPLANT
BANDAGE ELASTIC 4 VELCRO ST LF (GAUZE/BANDAGES/DRESSINGS) ×2 IMPLANT
BANDAGE ESMARK 6X9 LF (GAUZE/BANDAGES/DRESSINGS) IMPLANT
BLADE SAW GIGLI 510 (BLADE) ×2 IMPLANT
BLADE SAW GIGLI 510MM (BLADE) ×1
BNDG ESMARK 6X9 LF (GAUZE/BANDAGES/DRESSINGS)
BNDG GAUZE ELAST 4 BULKY (GAUZE/BANDAGES/DRESSINGS) ×4 IMPLANT
CANISTER SUCT 3000ML PPV (MISCELLANEOUS) ×3 IMPLANT
CLIP LIGATING EXTRA MED SLVR (CLIP) ×3 IMPLANT
CLIP LIGATING EXTRA SM BLUE (MISCELLANEOUS) ×3 IMPLANT
COVER SURGICAL LIGHT HANDLE (MISCELLANEOUS) ×6 IMPLANT
COVER WAND RF STERILE (DRAPES) ×1 IMPLANT
CUFF TOURNIQUET SINGLE 34IN LL (TOURNIQUET CUFF) IMPLANT
CUFF TOURNIQUET SINGLE 44IN (TOURNIQUET CUFF) IMPLANT
DRAIN SNY 10X20 3/4 PERF (WOUND CARE) IMPLANT
DRAPE HALF SHEET 40X57 (DRAPES) ×3 IMPLANT
DRAPE ORTHO SPLIT 77X108 STRL (DRAPES) ×4
DRAPE SURG ORHT 6 SPLT 77X108 (DRAPES) ×2 IMPLANT
ELECT CAUTERY BLADE 6.4 (BLADE) ×3 IMPLANT
ELECT REM PT RETURN 9FT ADLT (ELECTROSURGICAL) ×3
ELECTRODE REM PT RTRN 9FT ADLT (ELECTROSURGICAL) ×1 IMPLANT
EVACUATOR SILICONE 100CC (DRAIN) IMPLANT
GAUZE SPONGE 4X4 12PLY STRL (GAUZE/BANDAGES/DRESSINGS) ×6 IMPLANT
GAUZE SPONGE 4X4 12PLY STRL LF (GAUZE/BANDAGES/DRESSINGS) ×2 IMPLANT
GAUZE XEROFORM 5X9 LF (GAUZE/BANDAGES/DRESSINGS) ×3 IMPLANT
GLOVE BIO SURGEON STRL SZ 6.5 (GLOVE) ×1 IMPLANT
GLOVE BIO SURGEONS STRL SZ 6.5 (GLOVE) ×1
GLOVE BIOGEL PI IND STRL 6 (GLOVE) IMPLANT
GLOVE BIOGEL PI IND STRL 6.5 (GLOVE) IMPLANT
GLOVE BIOGEL PI INDICATOR 6 (GLOVE) ×2
GLOVE BIOGEL PI INDICATOR 6.5 (GLOVE) ×2
GLOVE INDICATOR 7.0 STRL GRN (GLOVE) ×6 IMPLANT
GLOVE SS BIOGEL STRL SZ 7.5 (GLOVE) ×1 IMPLANT
GLOVE SUPERSENSE BIOGEL SZ 7.5 (GLOVE) ×2
GLOVE SURG SS PI 7.0 STRL IVOR (GLOVE) ×2 IMPLANT
GOWN STRL REUS W/ TWL LRG LVL3 (GOWN DISPOSABLE) ×3 IMPLANT
GOWN STRL REUS W/TWL LRG LVL3 (GOWN DISPOSABLE) ×8
KIT BASIN OR (CUSTOM PROCEDURE TRAY) ×3 IMPLANT
KIT TURNOVER KIT B (KITS) ×3 IMPLANT
NS IRRIG 1000ML POUR BTL (IV SOLUTION) ×3 IMPLANT
PACK GENERAL/GYN (CUSTOM PROCEDURE TRAY) ×3 IMPLANT
PAD ARMBOARD 7.5X6 YLW CONV (MISCELLANEOUS) ×6 IMPLANT
PADDING CAST COTTON 6X4 STRL (CAST SUPPLIES) IMPLANT
STAPLER VISISTAT 35W (STAPLE) ×3 IMPLANT
STOCKINETTE IMPERVIOUS LG (DRAPES) ×3 IMPLANT
SUT ETHILON 3 0 PS 1 (SUTURE) IMPLANT
SUT VIC AB 0 CT1 18XCR BRD 8 (SUTURE) ×2 IMPLANT
SUT VIC AB 0 CT1 8-18 (SUTURE) ×4
SUT VICRYL AB 2 0 TIES (SUTURE) ×3 IMPLANT
TOWEL GREEN STERILE (TOWEL DISPOSABLE) ×6 IMPLANT
UNDERPAD 30X30 (UNDERPADS AND DIAPERS) ×3 IMPLANT
WATER STERILE IRR 1000ML POUR (IV SOLUTION) ×3 IMPLANT

## 2018-10-23 NOTE — Anesthesia Postprocedure Evaluation (Signed)
Anesthesia Post Note  Patient: Daniel Campos  Procedure(s) Performed: LEFT AMPUTATION ABOVE KNEE (Left )     Patient location during evaluation: PACU Anesthesia Type: General Level of consciousness: awake and alert Pain management: pain level controlled Vital Signs Assessment: post-procedure vital signs reviewed and stable Respiratory status: spontaneous breathing, nonlabored ventilation, respiratory function stable and patient connected to nasal cannula oxygen Cardiovascular status: blood pressure returned to baseline and stable Postop Assessment: no apparent nausea or vomiting Anesthetic complications: no    Last Vitals:  Vitals:   10/23/18 1440 10/23/18 1450  BP: (!) 100/59 106/65  Pulse: (!) 106   Resp: (!) 23   Temp:    SpO2: 96%     Last Pain:  Vitals:   10/23/18 1450  TempSrc:   PainSc: 6                  Reba Hulett,W. EDMOND

## 2018-10-23 NOTE — Social Work (Signed)
CSW acknowledging consult for access to medications at discharge.  For medication access please consult RN Case Management.   CSW signing off. Please consult if any additional needs arise.  Adelin Ventrella, MSW, LCSWA [place] Clinical Social Work [phone]     

## 2018-10-23 NOTE — Progress Notes (Signed)
Inpatient Diabetes Program Recommendations  AACE/ADA: New Consensus Statement on Inpatient Glycemic Control (2015)  Target Ranges:  Prepandial:   less than 140 mg/dL      Peak postprandial:   less than 180 mg/dL (1-2 hours)      Critically ill patients:  140 - 180 mg/dL   Lab Results  Component Value Date   GLUCAP 200 (H) 10/23/2018   HGBA1C 7.7 (H) 10/22/2018    Review of Glycemic Control Results for Daniel Campos, Daniel Campos (MRN [id number]) as of 10/23/2018 09:19  Ref. Range 10/22/2018 12:30 10/22/2018 18:04 10/22/2018 21:09 10/23/2018 08:25  Glucose-Capillary Latest Ref Range: 70 - 99 mg/dL 186 (H) 174 (H) 135 (H) 200 (H)   Diabetes history: Type 2 DM Outpatient Diabetes medications: Lantus 20 units QHS, Novolog 5 units TID Current orders for Inpatient glycemic control: Lantus 20 units QHS, Novolog 5 units TID, Novolog 0-15 units TID, Novolog 0-5 units QHS  Inpatient Diabetes Program Recommendations:    Patient refused Lantus 20 units QHS on 6/7. Assuming with patient being NPO, he feared having a hypoglycemic event given his CBG at the time of refusal was 135 mg/dL.  Consider Lantus 8 units BID (20% reduction of home basal dose).  Secure chat sent.   Thanks, Bronson Curb, MSN, RNC-OB Diabetes Coordinator [phone] (8a-5p)

## 2018-10-23 NOTE — Progress Notes (Addendum)
PROGRESS NOTE  MARQUIST BINSTOCK MOQ:[id number] DOB: [birth year] DOA: 10/21/2018 PCP: [facility] [place]   LOS: 2 days   Brief narrative: 63 y.o.malewith a history of diabetes, hypertension, Pulmonary embolism in 2018 not currently on anticoagulation, presented to the hospital with approximately 3 weeks of nonhealing ulcerations to his left leg and foot. Initially, he had some pain in his calf and was seen at the [facility] office. He had an ultrasound that was negative for blood clot and was sent home. Patient continued to have multiple sores appearing to the left leg and foot that scabbed and turned black.  In the emergency department, patient had white count elevated to 18. Hemoglobin 6. Blood cultures were obtained and the patient was started on vancomycin. Ceftriaxone and Flagyl were added. Patient tested negative for COVID. Patient received 2 units of blood in the ED. TR[facility] was called to admit patient for further evaluation and management of Left lower extremity gangrene with superimposed cellulitis.   Subjective: Today patient denies overt pain on left foot.  Denies any chest pain, shortness of breath, fever or chills.  Assessment/Plan:  Principal Problem:   Gangrene of left foot (HCC) Active Problems:   Diabetes ([place])   Essential hypertension, benign   Acute anemia   History of pulmonary embolus (PE)   Heme positive stool  Gangrene of left foot with superimposed cellulitis Mild leukocytosis noted.  On broad-spectrum IV antibiotics with Rocephin, vancomycin and metronidazole.  Seen by vascular surgery and recommended left above-knee amputation for 10/23/2018.Lactate within normal limits.  MRI of the left foot shows diffuse edema no osteomyelitis.  Acute anemia.  Patient history of GI bleed.  Patient received 2 units of packed RBC with appropriate rise in hemoglobin of 8.6 today.  No signs of bleeding on this admission.  Diabetes mellitus type 2 with peripheral neuropathy.   Continue on sliding scale insulin, Lantus Accu-Cheks Lyrica.  Essential hypertension.  Currently blood pressure is stable.  Continue current regimen.  History of pulmonary embolus (PE);Not on anticoagulation currently.  VTE Prophylaxis: Holding for possible surgical intervention  Code Status: Full code  Family Communication: None  Disposition Plan: Home/rehab after surgical intervention   Consultants:  Vascular surgery  Procedures:  None so far  Antibiotics: Anti-infectives (From admission, onward)   Start     Dose/Rate Route Frequency Ordered Stop   10/22/18 1200  vancomycin (VANCOCIN) 1,500 mg in sodium chloride 0.9 % 500 mL IVPB     1,500 mg 250 mL/hr over 120 Minutes Intravenous Every 24 hours 10/21/18 2359     10/21/18 2100  cefTRIAXone (ROCEPHIN) 1 g in sodium chloride 0.9 % 100 mL IVPB     1 g 200 mL/hr over 30 Minutes Intravenous Every 24 hours 10/21/18 1937     10/21/18 2000  metroNIDAZOLE (FLAGYL) IVPB 500 mg     500 mg 100 mL/hr over 60 Minutes Intravenous Every 8 hours 10/21/18 1937     10/21/18 1415  vancomycin (VANCOCIN) IVPB 1000 mg/200 mL premix     1,000 mg 200 mL/hr over 60 Minutes Intravenous  Once 10/21/18 1405 10/21/18 1725       Objective: Vitals:   10/22/18 2112 10/23/18 0519  BP: 140/82 129/73  Pulse: (!) 102 100  Resp: 18   Temp: 98.1 F (36.7 C) 98 F (36.7 C)  SpO2: 97% 97%    Intake/Output Summary (Last 24 hours) at 10/23/2018 0833 Last data filed at 10/23/2018 0545 Gross per 24 hour  Intake 1120.06 ml  Output 600 ml  Net 520.06 ml   Filed Weights   10/21/18 1336  Weight: 77.1 kg   Body mass index is 23.71 kg/m.   Physical Exam: GENERAL: Patient is alert awake and oriented. Not in obvious distress. HENT: No scleral pallor or icterus. Pupils equally reactive to light. Oral mucosa is moist NECK: is supple, no palpable thyroid enlargement. CHEST: Clear to auscultation. No crackles or wheezes. Non tender on palpation.  Diminished breath sounds bilaterally. CVS: S1 and S2 heard, no murmur. Regular rate and rhythm. No pericardial rub. ABDOMEN: Soft, non-tender, bowel sounds are present. No palpable hepato-splenomegaly. EXTREMITIES: Left leg with gross edema mostly in ischemic changes with ulceration of the posterior calf.  Distal extremity cool and unable to palpate dorsalis pedis.  Decreased sensation. CNS: Cranial nerves are intact.  Moves all extremities SKIN: Left lower extremity ischemic changes  Data Review: I have personally reviewed the following laboratory data and studies,  CBC: Recent Labs  Lab 10/21/18 1432 10/22/18 0033 10/23/18 0333  WBC 18.6* 19.9* 16.1*  NEUTROABS 16.2*  --   --   HGB 6.0* 8.7* 8.6*  HCT 19.0* 26.5* 26.5*  MCV 84.4 84.4 84.7  PLT 720* 712* 962*   Basic Metabolic Panel: Recent Labs  Lab 10/21/18 1432 10/22/18 0616 10/23/18 0333  NA 131* 131* 134*  K 5.0 5.2* 4.7  CL 101 102 106  CO2 20* 12* 17*  GLUCOSE 194* 307* 170*  BUN 18 21 16   CREATININE 1.16 1.37* 1.13  CALCIUM 8.0* 7.9* 8.1*   Liver Function Tests: Recent Labs  Lab 10/22/18 0616  AST 11*  ALT 14  ALKPHOS 115  BILITOT 1.2  PROT 5.7*  ALBUMIN 1.7*   No results for input(s): LIPASE, AMYLASE in the last 168 hours. No results for input(s): AMMONIA in the last 168 hours. Cardiac Enzymes: No results for input(s): CKTOTAL, CKMB, CKMBINDEX, TROPONINI in the last 168 hours. BNP (last 3 results) No results for input(s): BNP in the last 8760 hours.  ProBNP (last 3 results) No results for input(s): PROBNP in the last 8760 hours.  CBG: Recent Labs  Lab 10/22/18 0843 10/22/18 1230 10/22/18 1804 10/22/18 2109 10/23/18 0825  GLUCAP 298* 186* 174* 135* 200*   Recent Results (from the past 240 hour(s))  Blood culture (routine x 2)     Status: None (Preliminary result)   Collection Time: 10/21/18  2:32 PM  Result Value Ref Range Status   Specimen Description LEFT ANTECUBITAL  Final    Special Requests   Final    BOTTLES DRAWN AEROBIC AND ANAEROBIC Blood Culture adequate volume   Culture   Final    NO GROWTH 2 DAYS Performed at [facility], [address], [place], [place] [address]    Report Status PENDING  Incomplete  Blood culture (routine x 2)     Status: None (Preliminary result)   Collection Time: 10/21/18  2:34 PM  Result Value Ref Range Status   Specimen Description BLOOD LEFT ARM  Final   Special Requests   Final    BOTTLES DRAWN AEROBIC AND ANAEROBIC Blood Culture adequate volume   Culture   Final    NO GROWTH 2 DAYS Performed at [facility], [address], [place], [place] [address]    Report Status PENDING  Incomplete  SARS Coronavirus 2 (CEPHEID - Performed in [place] hospital lab), Hosp Order     Status: None   Collection Time: 10/21/18  3:38 PM  Result Value Ref Range  Status   SARS Coronavirus 2 NEGATIVE NEGATIVE Final    Comment: (NOTE) If result is NEGATIVE SARS-CoV-2 target nucleic acids are NOT DETECTED. The SARS-CoV-2 RNA is generally detectable in upper and lower  respiratory specimens during the acute phase of infection. The lowest  concentration of SARS-CoV-2 viral copies this assay can detect is 250  copies / mL. A negative result does not preclude SARS-CoV-2 infection  and should not be used as the sole basis for treatment or other  patient management decisions.  A negative result may occur with  improper specimen collection / handling, submission of specimen other  than nasopharyngeal swab, presence of viral mutation(s) within the  areas targeted by this assay, and inadequate number of viral copies  (<250 copies / mL). A negative result must be combined with clinical  observations, patient history, and epidemiological information. If result is POSITIVE SARS-CoV-2 target nucleic acids are DETECTED. The SARS-CoV-2 RNA is generally detectable in upper and lower  respiratory specimens dur ing the acute phase of infection.  Positive   results are indicative of active infection with SARS-CoV-2.  Clinical  correlation with patient history and other diagnostic information is  necessary to determine patient infection status.  Positive results do  not rule out bacterial infection or co-infection with other viruses. If result is PRESUMPTIVE POSTIVE SARS-CoV-2 nucleic acids MAY BE PRESENT.   A presumptive positive result was obtained on the submitted specimen  and confirmed on repeat testing.  While 2019 novel coronavirus  (SARS-CoV-2) nucleic acids may be present in the submitted sample  additional confirmatory testing may be necessary for epidemiological  and / or clinical management purposes  to differentiate between  SARS-CoV-2 and other Sarbecovirus currently known to infect humans.  If clinically indicated additional testing with an alternate test  methodology [phone]) is advised. The SARS-CoV-2 RNA is generally  detectable in upper and lower respiratory sp ecimens during the acute  phase of infection. The expected result is Negative. Fact Sheet for Patients:  StrictlyIdeas.no Fact Sheet for Healthcare Providers: BankingDealers.co.za This test is not yet approved or cleared by the [place] FDA and has been authorized for detection and/or diagnosis of SARS-CoV-2 by FDA under an Emergency Use Authorization (EUA).  This EUA will remain in effect (meaning this test can be used) for the duration of the COVID-19 declaration under Section 564(b)(1) of the Act, 21 U.S.C. section 360bbb-3(b)(1), unless the authorization is terminated or revoked sooner. Performed at [facility], [address], [place], [place] [address]   Surgical pcr screen     Status: Abnormal   Collection Time: 10/22/18  2:58 PM  Result Value Ref Range Status   MRSA, PCR NEGATIVE NEGATIVE Final   Staphylococcus aureus POSITIVE (A) NEGATIVE Final    Comment: (NOTE) The Xpert SA Assay (FDA approved  for NASAL specimens in patients 19 years of age and older), is one component of a comprehensive surveillance program. It is not intended to diagnose infection nor to guide or monitor treatment. Performed at [facility], [place] [address] [place], [place] [address]      Studies: Dg Tibia/fibula Left  Result Date: 10/21/2018 CLINICAL DATA:  Chronic lateral lower leg wound. EXAM: LEFT TIBIA AND FIBULA - 2 VIEW COMPARISON:  None. FINDINGS: There is no evidence of fracture or other focal bone lesions. No osseous destruction or periosteal reaction. Osteopenia. Mild diffuse soft tissue swelling. IMPRESSION: 1. Mild diffuse soft tissue swelling.  No acute osseous abnormality. Electronically Signed  By: Titus Dubin M.D.   On: 10/21/2018 15:39   Mr Foot Left W Wo Contrast  Result Date: 10/22/2018 CLINICAL DATA:  Left foot gangrene. EXAM: MRI OF THE LEFT FOREFOOT WITHOUT AND WITH CONTRAST TECHNIQUE: Multiplanar, multisequence MR imaging of the left forefoot was performed both before and after administration of intravenous contrast. CONTRAST:  7 mL Gadavist intravenous contrast. COMPARISON:  Left foot x-rays from yesterday. FINDINGS: Bones/Joint/Cartilage No marrow signal abnormality. No acute fracture or dislocation. Chronic midfoot collapse. No joint effusion. Ligaments Collateral ligaments are intact. Muscles and Tendons Increased T2 signal within the intrinsic muscles of the forefoot, nonspecific, but likely related to diabetic muscle changes. Soft tissue Diffuse soft tissue swelling. No fluid collection or hematoma. No soft tissue mass. IMPRESSION: 1. Diffuse soft tissue swelling.  No osteomyelitis or abscess. 2. Neuropathic arthropathy with chronic midfoot collapse. Electronically Signed   By: Titus Dubin M.D.   On: 10/22/2018 17:04   Dg Foot Complete Left  Result Date: 10/21/2018 CLINICAL DATA:  Toe gangrene. EXAM: LEFT FOOT - COMPLETE 3+ VIEW COMPARISON:  None. FINDINGS: No osseous  destruction or periosteal reaction. No acute fracture. Healed fracture deformity of the first proximal phalanx. Chronic midfoot collapse with prominent bony hypertrophy. Mild first MTP joint osteoarthritis. Osteopenia. Diffuse soft tissue swelling. IMPRESSION: 1. Diffuse soft tissue swelling. No radiographic evidence of osteomyelitis. 2. Neuropathic arthropathy with chronic midfoot collapse. Electronically Signed   By: Titus Dubin M.D.   On: 10/21/2018 15:38    Scheduled Meds: . aspirin EC  81 mg Oral Daily  . Chlorhexidine Gluconate Cloth  6 each Topical Daily  . collagenase   Topical Daily  . insulin aspart  0-15 Units Subcutaneous TID WC  . insulin aspart  0-5 Units Subcutaneous QHS  . insulin aspart  5 Units Subcutaneous TID WC  . insulin glargine  20 Units Subcutaneous QPM  . mupirocin ointment  1 application Nasal BID  . pantoprazole  40 mg Oral Daily  . pregabalin  100 mg Oral TID    Continuous Infusions: . cefTRIAXone (ROCEPHIN)  IV Stopped (10/22/18 2153)  . metronidazole 500 mg (10/23/18 0410)  . vancomycin Stopped (10/22/18 1720)     Flora Lipps, MD  [facility] 10/23/2018

## 2018-10-23 NOTE — Transfer of Care (Signed)
Immediate Anesthesia Transfer of Care Note  Patient: Daniel Campos  Procedure(s) Performed: LEFT AMPUTATION ABOVE KNEE (Left )  Patient Location: PACU  Anesthesia Type:General  Level of Consciousness: awake, alert , confused and responds to stimulation  Airway & Oxygen Therapy: Patient Spontanous Breathing and Patient connected to nasal cannula oxygen  Post-op Assessment: Report given to RN and Post -op Vital signs reviewed and stable  Post vital signs: Reviewed and stable  Last Vitals:  Vitals Value Taken Time  BP 145/128 10/23/2018  1:50 PM  Temp    Pulse 111 10/23/2018  1:53 PM  Resp 23 10/23/2018  1:53 PM  SpO2 99 % 10/23/2018  1:53 PM  Vitals shown include unvalidated device data.  Last Pain:  Vitals:   10/23/18 1023  TempSrc: Oral  PainSc:       Patients Stated Pain Goal: 2 (02/89/02 2840)  Complications: No apparent anesthesia complications

## 2018-10-23 NOTE — Progress Notes (Signed)
Initial Nutrition Assessment  RD working remotely.  DOCUMENTATION CODES:   Not applicable  INTERVENTION:   -Once diet is advanced, add:  -MVI with minerals daily -1 packet Juven BID, each packet provides 95 calories, 2.5 grams of protein (collagen), and 9.8 grams of carbohydrate (3 grams sugar); also contains 7 grams of L-arginine and L-glutamine, 300 mg vitamin C, 15 mg vitamin E, 1.2 mcg vitamin B-12, 9.5 mg zinc, 200 mg calcium, and 1.5 g  Calcium Beta-hydroxy-Beta-methylbutyrate to support wound healing -Ensure Max po daily, each supplement provides 150 kcal and 30 grams of protein.   NUTRITION DIAGNOSIS:   Increased nutrient needs related to wound healing, post-op healing as evidenced by estimated needs.  GOAL:   Patient will meet greater than or equal to 90% of their needs  MONITOR:   PO intake, Supplement acceptance, Diet advancement, Labs, Weight trends, Skin, I & O's  REASON FOR ASSESSMENT:   Consult Wound healing  ASSESSMENT:   Daniel Campos is a 63 y.o. male with a history of diabetes, hypertension, PE in 2018 not currently on anticoagulation.  Patient presents with approximately 3 weeks of nonhealing ulcerations to his left leg and foot.  Initially, he had some pain in his calf and was seen at the [facility] office.  He had an ultrasound that was negative for blood clot and was sent home.  Patient continued to have multiple sores appearing to the left leg and foot that scabbed and turned black.  His symptoms progressed to having blackness on his toes.  No palliating or provoking factors.  Denies chest pain, shortness of breath, dizziness, lightheadedness  Pt admitted with gangrene of lt foot.   Reviewed I/O's: +520 ml x 24 hours and +1.7 L since admission  UOP: 600 ml x 24 hours  Pt down in OR at time of visit (for lt AKA with vascular surgery). Unable to obtain further nutriton-related history at this time. Of note, pt has progressed to wheelchair mobility over the  past 3 weeks secondary to pain in his lt leg and foot secondary to chronic ulcerations.   Reviewed wt hx; noted wt has been stable over the past 2 years.   Pt with increased nutrient needs to support post-operative healing and would benefit from addition of oral nutrition supplements.   Lab Results  Component Value Date   HGBA1C 7.7 (H) 10/22/2018   PTA DM medications are 15-25 units insulin glargine q HS, 5 units insulin aspart TID with meals. Suspect chronic ulcerations may be contributing to elevated Hgb A1c.   Labs reviewed: Na: 134, CBGS: 135-200 (inpatient orders for glycemic control are 0-15 units insulin aspart TID with meals, 0-5 units insulin aspart q HS, 5 units insulin aspart TID with meals, and  20 units insulin glargine every evening).   NUTRITION - FOCUSED PHYSICAL EXAM:    Most Recent Value  Orbital Region  Unable to assess  Upper Arm Region  Unable to assess  Thoracic and Lumbar Region  Unable to assess  Buccal Region  Unable to assess  Temple Region  Unable to assess  Clavicle Bone Region  Unable to assess  Clavicle and Acromion Bone Region  Unable to assess  Scapular Bone Region  Unable to assess  Dorsal Hand  Unable to assess  Patellar Region  Unable to assess  Anterior Thigh Region  Unable to assess  Posterior Calf Region  Unable to assess  Edema (RD Assessment)  Unable to assess  Hair  Unable to assess  Eyes  Unable to assess  Mouth  Unable to assess  Skin  Unable to assess  Nails  Unable to assess       Diet Order:   Diet Order            Diet NPO time specified  Diet effective midnight              EDUCATION NEEDS:   No education needs have been identified at this time  Skin:  Skin Assessment: Skin Integrity Issues: Skin Integrity Issues:: Other (Comment) Other: lt leg and lt foot ulcerations with gangrene; scheduled for lt AKA 10/23/18  Last BM:  10/21/18  Height:   Ht Readings from Last 1 Encounters:  10/21/18 5\' 11"  (1.803 m)     Weight:   Wt Readings from Last 1 Encounters:  10/21/18 77.1 kg    Ideal Body Weight:  71.9 kg(adjusted for lt AKA)  BMI:  Body mass index is 23.71 kg/m.  Estimated Nutritional Needs:   Kcal:  1950-2150  Protein:  105-120 grams  Fluid:  > 2 L    Tanish Sinkler A. Jimmye Norman, RD, LDN, [place] Registered Dietitian II Certified Diabetes Care and Education Specialist Pager: [phone] After hours Pager: [phone]

## 2018-10-23 NOTE — Progress Notes (Signed)
Inpatient Rehabilitation Admissions Coordinator  Inpatient rehab consult received. I will follow up after therapy evaluations to assist with planning rehab dispo.  Danne Baxter, RN, MSN Rehab Admissions Coordinator [phone] 10/23/2018 5:45 PM

## 2018-10-23 NOTE — Anesthesia Preprocedure Evaluation (Addendum)
Anesthesia Evaluation  Patient identified by MRN, date of birth, ID band Patient awake    Reviewed: Allergy & Precautions, H&P , NPO status , Patient's Chart, lab work & pertinent test results  Airway Mallampati: II  TM Distance: >3 FB Neck ROM: Full    Dental no notable dental hx. (+) Teeth Intact, Poor Dentition, Dental Advisory Given   Pulmonary neg pulmonary ROS, former smoker,    Pulmonary exam normal breath sounds clear to auscultation       Cardiovascular hypertension,  Rhythm:Regular Rate:Normal     Neuro/Psych negative neurological ROS  negative psych ROS   GI/Hepatic negative GI ROS, Neg liver ROS,   Endo/Other  diabetes, Type 1, Insulin Dependent  Renal/GU Renal disease  negative genitourinary   Musculoskeletal   Abdominal   Peds  Hematology  (+) Blood dyscrasia, anemia ,   Anesthesia Other Findings   Reproductive/Obstetrics negative OB ROS                            Anesthesia Physical Anesthesia Plan  ASA: III  Anesthesia Plan: General   Post-op Pain Management:    Induction: Intravenous  PONV Risk Score and Plan: 3 and Ondansetron, Midazolam and Dexamethasone  Airway Management Planned: Oral ETT  Additional Equipment:   Intra-op Plan:   Post-operative Plan: Extubation in OR  Informed Consent: I have reviewed the patients History and Physical, chart, labs and discussed the procedure including the risks, benefits and alternatives for the proposed anesthesia with the patient or authorized representative who has indicated his/her understanding and acceptance.     Dental advisory given  Plan Discussed with: CRNA  Anesthesia Plan Comments:         Anesthesia Quick Evaluation

## 2018-10-23 NOTE — Op Note (Signed)
    OPERATIVE REPORT  DATE OF SURGERY: 10/23/2018  PATIENT: Daniel Campos, 63 y.o. male MRN: [id number]  DOB: 1955-08-14  PRE-OPERATIVE DIAGNOSIS: Profound ischemia left lower extremity  POST-OPERATIVE DIAGNOSIS:  Same  PROCEDURE: Left above-knee amputation  SURGEON:  Curt Jews, M.D.  PHYSICIAN ASSISTANT: Nurse  ANESTHESIA: General  EBL: per anesthesia record  Total I/O In: 1000 [I.V.:1000] Out: 75 [Blood:75]  BLOOD ADMINISTERED: none  DRAINS: none  SPECIMEN: none  COUNTS CORRECT:  YES  PATIENT DISPOSITION:  PACU - hemodynamically stable  PROCEDURE DETAILS: Patient was taken the operating placed supine position where the area of the left leg was prepped draped in sterile fashion.  [place] incision was made on the distal thigh and carried down through the fascia and muscle to the level of the femur.  The neurovascular bundle was identified in the medial aspect of the superficial femoral artery and femoral vein were individually ligated with 2-0 Vicryl ties and divided.  The superficial femoral artery was patent at this level.  Sciatic nerve was ligated and divided.  The periosteum was elevated off the femur and the femur divided with a Gigli saw.  The bone edges were smoothed with a bone rasp.  The wound was irrigated with saline and hemostasis obtained electrocautery.  The wound was closed with 2-0 Vicryl figure-of-eight sutures in the fascia to close the anterior to posterior fascia.  The skin was then closed with skin staples.  A sterile dressing was applied and the patient was transferred to the recovery in stable condition   Rosetta Posner, M.D., [facility] 10/23/2018 1:48 PM

## 2018-10-23 NOTE — Anesthesia Procedure Notes (Signed)
Procedure Name: Intubation Date/Time: 10/23/2018 12:12 PM Performed by: Gayland Curry, CRNA Pre-anesthesia Checklist: Patient identified, Emergency Drugs available, Suction available, Patient being monitored and Timeout performed Patient Re-evaluated:Patient Re-evaluated prior to induction Oxygen Delivery Method: Circle system utilized Preoxygenation: Pre-oxygenation with 100% oxygen Induction Type: IV induction, Rapid sequence and Cricoid Pressure applied Laryngoscope Size: Mac and 4 Grade View: Grade II Tube type: Oral Tube size: 7.0 mm Number of attempts: 1 Airway Equipment and Method: Stylet Placement Confirmation: ETT inserted through vocal cords under direct vision,  positive ETCO2 and breath sounds checked- equal and bilateral Secured at: 23 cm Tube secured with: Tape Dental Injury: Teeth and Oropharynx as per pre-operative assessment

## 2018-10-23 NOTE — Interval H&P Note (Signed)
History and Physical Interval Note:  10/23/2018 11:14 AM  Daniel Campos  has presented today for surgery, with the diagnosis of Gangrene of left foot.  The various methods of treatment have been discussed with the patient and family. After consideration of risks, benefits and other options for treatment, the patient has consented to  Procedure(s): AMPUTATION ABOVE KNEE (Left) as a surgical intervention.  The patient's history has been reviewed, patient examined, no change in status, stable for surgery.  I have reviewed the patient's chart and labs.  Questions were answered to the patient's satisfaction.     Curt Jews

## 2018-10-23 NOTE — Consult Note (Signed)
PV Navigator consult acknowledged and chart reviewed. PV Navigator working remotely.  63 y/o  Male admitted 10/21/18 for scheduled surgery. Has had non-healing ulcerations to the L lower leg and foot >10mo. with ischemic changes noted. Surgical L AKA performed today by Dr Donnetta Hutching.  Medical history to include DM, HTN and acute anemia. Hx of PE in 2018, not currently on anticoagulation.  Diabetes coordinator, SW, and [place] consults completed.  Will continue to follow. Thank you for this consult.   Cletis Media RN BSN CWS [place]

## 2018-10-24 ENCOUNTER — Encounter (HOSPITAL_COMMUNITY): Payer: Self-pay | Admitting: Vascular Surgery

## 2018-10-24 LAB — CBC
HCT: 28.5 % — ABNORMAL LOW (ref 39.0–52.0)
Hemoglobin: 8.9 g/dL — ABNORMAL LOW (ref 13.0–17.0)
MCH: 27.3 pg (ref 26.0–34.0)
MCHC: 31.2 g/dL (ref 30.0–36.0)
MCV: 87.4 fL (ref 80.0–100.0)
Platelets: 784 10*3/uL — ABNORMAL HIGH (ref 150–400)
RBC: 3.26 MIL/uL — ABNORMAL LOW (ref 4.22–5.81)
RDW: 14.6 % (ref 11.5–15.5)
WBC: 17.3 10*3/uL — ABNORMAL HIGH (ref 4.0–10.5)
nRBC: 0 % (ref 0.0–0.2)

## 2018-10-24 LAB — COMPREHENSIVE METABOLIC PANEL
ALT: 10 U/L (ref 0–44)
AST: 13 U/L — ABNORMAL LOW (ref 15–41)
Albumin: 1.7 g/dL — ABNORMAL LOW (ref 3.5–5.0)
Alkaline Phosphatase: 104 U/L (ref 38–126)
Anion gap: 16 — ABNORMAL HIGH (ref 5–15)
BUN: 21 mg/dL (ref 8–23)
CO2: 14 mmol/L — ABNORMAL LOW (ref 22–32)
Calcium: 8.3 mg/dL — ABNORMAL LOW (ref 8.9–10.3)
Chloride: 107 mmol/L (ref 98–111)
Creatinine, Ser: 1.65 mg/dL — ABNORMAL HIGH (ref 0.61–1.24)
GFR calc Af Amer: 50 mL/min — ABNORMAL LOW (ref >60)
GFR calc non Af Amer: 43 mL/min — ABNORMAL LOW (ref >60)
Glucose, Bld: 198 mg/dL — ABNORMAL HIGH (ref 70–99)
Potassium: 4.6 mmol/L (ref 3.5–5.1)
Sodium: 137 mmol/L (ref 135–145)
Total Bilirubin: 0.9 mg/dL (ref 0.3–1.2)
Total Protein: 5.9 g/dL — ABNORMAL LOW (ref 6.5–8.1)

## 2018-10-24 LAB — GLUCOSE, CAPILLARY
Glucose-Capillary: 197 mg/dL — ABNORMAL HIGH (ref 70–99)
Glucose-Capillary: 172 mg/dL — ABNORMAL HIGH (ref 70–99)
Glucose-Capillary: 148 mg/dL — ABNORMAL HIGH (ref 70–99)
Glucose-Capillary: 98 mg/dL (ref 70–99)

## 2018-10-24 LAB — MAGNESIUM: Magnesium: 1.5 mg/dL — ABNORMAL LOW (ref 1.7–2.4)

## 2018-10-24 NOTE — Progress Notes (Signed)
Inpatient Rehabilitation Admissions Coordinator  Inpatient Rehab Consult received. I met with patient at the bedside for rehabilitation assessment. Patient hard of hearing and seems somewhat confused at this time. I contacted his sister and his daughter with his permission. Last year patient broke his pelvis and refused rehab at [facility] or home. Over the past month he has been using his wheelchair due to his foot pain but able to bathe, dress, and toilet with supervision which he has 24/7. Daughter would like to discuss with family and her Dad and will follow up tomorrow with CIR vs SNF preference.   Danne Baxter, RN, MSN Rehab Admissions Coordinator [phone] 10/24/2018 12:57 PM

## 2018-10-24 NOTE — Progress Notes (Signed)
PROGRESS NOTE  Daniel Campos MVH:[id number] DOB: 10-08-1955 DOA: 10/21/2018 PCP: [facility] [place]   LOS: 3 days   Brief narrative: 63 y.o.malewith a history of diabetes, hypertension, pulmonary embolism in 2018 not currently on anticoagulation, presented to the hospital with approximately 3 weeks of nonhealing ulcerations to his left leg and foot. Initially, he had some pain in his calf and was seen at the [facility] office. He had an ultrasound that was negative for blood clot and was sent home. Patient continued to have multiple sores appearing to the left leg and foot that scabbed and turned black.  In the emergency department, patient had white count elevated to 18. Hemoglobin of 6. Blood cultures were obtained and the patient was started on vancomycin. Ceftriaxone and Flagyl were added. Patient tested negative for COVID. Patient received 2 units of blood in the ED.  Patient was then admitted to the hospital for further evaluation and management of left lower extremity gangrene with superimposed cellulitis.   Subjective: Patient complains of moderate to severe operative site pain.  Denies any nausea vomiting or diarrhea.Denies any fever, chills or rigor.  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 MRI of the left foot shows diffuse edema no osteomyelitis.Blood cultures have been negative in 3 days.  Patient is afebrile.  He still has significant leukocytosis so we will continue with antibiotic for now.  Acute anemia.  Patient history of GI bleed.  Patient received 2 units of packed RBC with appropriate rise in hemoglobin.  Hemoglobin of 8.9 today.  Diabetes mellitus type 2 with peripheral neuropathy.   Continue on sliding scale insulin, Lantus Accu-Cheks, Lyrica.  Essential hypertension.  Currently blood pressure is stable.  Continue current regimen.  History of pulmonary embolus (PE); Not on anticoagulation currently.  VTE Prophylaxis: Will resume anticoagulation if is okay with vascular surgery.  Code Status: Full code  Family Communication: None  Disposition Plan: Likely to inpatient rehab.  Consulted CIR.   Consultants:  Vascular surgery  Procedures:  Left above-knee amputation by vascular surgery on 10/23/2018  Antibiotics: Anti-infectives (From admission, onward)   Start     Dose/Rate Route Frequency Ordered Stop   10/23/18 1045  metronidazole (FLAGYL) IVPB 500 mg     500 mg 100 mL/hr over 60 Minutes Intravenous To Surgery 10/23/18 1042 10/23/18 1215   10/22/18 1200  vancomycin (VANCOCIN) 1,500 mg in sodium chloride 0.9 % 500 mL IVPB     1,500 mg 250 mL/hr over 120 Minutes Intravenous Every 24 hours 10/21/18 2359 10/24/18 2359   10/21/18 2100  cefTRIAXone (ROCEPHIN) 1 g in sodium chloride 0.9 % 100 mL IVPB     1 g 200 mL/hr over 30 Minutes Intravenous Every 24 hours 10/21/18 1937 10/24/18 2359   10/21/18 2000  metroNIDAZOLE (FLAGYL) IVPB 500 mg     500 mg 100 mL/hr over 60 Minutes Intravenous Every 8 hours 10/21/18 1937 10/24/18 2359   10/21/18 1415  vancomycin (VANCOCIN) IVPB 1000 mg/200 mL premix     1,000 mg 200 mL/hr over 60 Minutes Intravenous  Once 10/21/18 1405 10/21/18 1725      Objective: Vitals:   10/24/18 0050 10/24/18 0522  BP: 130/69 (!) 141/79  Pulse: (!) 105 (!) 101  Resp: 14 14  Temp: 97.9 F (36.6 C) 97.8 F (36.6 C)  SpO2: 98% 98%    Intake/Output Summary (Last 24 hours) at 10/24/2018 0937 Last data filed at 10/24/2018 0541 Gross per 24 hour  Intake 1350 ml  Output 1165 ml  Net 185 ml   Filed Weights   10/21/18 1336  Weight: 77.1 kg   Body mass index is 23.71 kg/m.   Physical Exam: GENERAL: Patient is alert awake and  oriented. Not in obvious distress. HENT: No scleral pallor or icterus. Pupils equally reactive to light. Oral mucosa is moist NECK: is supple, no palpable thyroid enlargement. CHEST: Clear to auscultation. No crackles or wheezes. Non tender on palpation. Diminished breath sounds bilaterally. CVS: S1 and S2 heard, no murmur. Regular rate and rhythm. No pericardial rub. ABDOMEN: Soft, non-tender, bowel sounds are present. No palpable hepato-splenomegaly. EXTREMITIES: Left above-knee amputation, on Ace wrap. CNS: Cranial nerves are intact.  Moves all extremities SKIN: Status post left above-knee amputation.  Data Review: I have personally reviewed the following laboratory data and studies,  CBC: Recent Labs  Lab 10/21/18 1432 10/22/18 0033 10/23/18 0333 10/24/18 0332  WBC 18.6* 19.9* 16.1* 17.3*  NEUTROABS 16.2*  --   --   --   HGB 6.0* 8.7* 8.6* 8.9*  HCT 19.0* 26.5* 26.5* 28.5*  MCV 84.4 84.4 84.7 87.4  PLT 720* 712* 751* 144*   Basic Metabolic Panel: Recent Labs  Lab 10/21/18 1432 10/22/18 0616 10/23/18 0333 10/24/18 0332  NA 131* 131* 134* 137  K 5.0 5.2* 4.7 4.6  CL 101 102 106 107  CO2 20* 12* 17* 14*  GLUCOSE 194* 307* 170* 198*  BUN 18 21 16 21   CREATININE 1.16 1.37* 1.13 1.65*  CALCIUM 8.0* 7.9* 8.1* 8.3*  MG  --   --   --  1.5*   Liver Function Tests: Recent Labs  Lab 10/22/18 0616 10/24/18 0332  AST 11* 13*  ALT 14 10  ALKPHOS 115 104  BILITOT 1.2 0.9  PROT 5.7* 5.9*  ALBUMIN 1.7* 1.7*   No results for input(s): LIPASE, AMYLASE in the last 168 hours. No results for input(s): AMMONIA in the last 168 hours. Cardiac Enzymes: No results for input(s): CKTOTAL, CKMB, CKMBINDEX, TROPONINI in the last 168 hours. BNP (last 3 results) No results for input(s): BNP in the last 8760 hours.  ProBNP (last 3 results) No results for input(s): PROBNP in the last 8760 hours.  CBG: Recent Labs  Lab 10/23/18 1352 10/23/18 1542 10/23/18 1852 10/23/18 2143  10/24/18 0831  GLUCAP 161* 185* 221* 213* 197*   Recent Results (from the past 240 hour(s))  Blood culture (routine x 2)     Status: None (Preliminary result)   Collection Time: 10/21/18  2:32 PM  Result Value Ref Range Status   Specimen Description LEFT ANTECUBITAL  Final   Special Requests   Final    BOTTLES DRAWN AEROBIC AND ANAEROBIC Blood Culture adequate volume   Culture   Final    NO GROWTH 3 DAYS Performed at [facility], [address] [place], [place] [address]    Report Status PENDING  Incomplete  Blood culture (routine x 2)     Status: None (Preliminary result)   Collection Time: 10/21/18  2:34 PM  Result Value Ref Range Status   Specimen Description BLOOD LEFT ARM  Final   Special Requests   Final    BOTTLES DRAWN AEROBIC AND ANAEROBIC Blood Culture adequate volume   Culture  Final    NO GROWTH 3 DAYS Performed at [facility], [address], [place], [place] [address]    Report Status PENDING  Incomplete  SARS Coronavirus 2 (CEPHEID - Performed in [place] hospital lab), Hosp Order     Status: None   Collection Time: 10/21/18  3:38 PM  Result Value Ref Range Status   SARS Coronavirus 2 NEGATIVE NEGATIVE Final    Comment: (NOTE) If result is NEGATIVE SARS-CoV-2 target nucleic acids are NOT DETECTED. The SARS-CoV-2 RNA is generally detectable in upper and lower  respiratory specimens during the acute phase of infection. The lowest  concentration of SARS-CoV-2 viral copies this assay can detect is 250  copies / mL. A negative result does not preclude SARS-CoV-2 infection  and should not be used as the sole basis for treatment or other  patient management decisions.  A negative result may occur with  improper specimen collection / handling, submission of specimen other  than nasopharyngeal swab, presence of viral mutation(s) within the  areas targeted by this assay, and inadequate number of viral copies  (<250 copies / mL). A negative result must be combined  with clinical  observations, patient history, and epidemiological information. If result is POSITIVE SARS-CoV-2 target nucleic acids are DETECTED. The SARS-CoV-2 RNA is generally detectable in upper and lower  respiratory specimens dur ing the acute phase of infection.  Positive  results are indicative of active infection with SARS-CoV-2.  Clinical  correlation with patient history and other diagnostic information is  necessary to determine patient infection status.  Positive results do  not rule out bacterial infection or co-infection with other viruses. If result is PRESUMPTIVE POSTIVE SARS-CoV-2 nucleic acids MAY BE PRESENT.   A presumptive positive result was obtained on the submitted specimen  and confirmed on repeat testing.  While 2019 novel coronavirus  (SARS-CoV-2) nucleic acids may be present in the submitted sample  additional confirmatory testing may be necessary for epidemiological  and / or clinical management purposes  to differentiate between  SARS-CoV-2 and other Sarbecovirus currently known to infect humans.  If clinically indicated additional testing with an alternate test  methodology [phone]) is advised. The SARS-CoV-2 RNA is generally  detectable in upper and lower respiratory sp ecimens during the acute  phase of infection. The expected result is Negative. Fact Sheet for Patients:  StrictlyIdeas.no Fact Sheet for Healthcare Providers: BankingDealers.co.za This test is not yet approved or cleared by the [place] FDA and has been authorized for detection and/or diagnosis of SARS-CoV-2 by FDA under an Emergency Use Authorization (EUA).  This EUA will remain in effect (meaning this test can be used) for the duration of the COVID-19 declaration under Section 564(b)(1) of the Act, 21 U.S.C. section 360bbb-3(b)(1), unless the authorization is terminated or revoked sooner. Performed at [facility], [address] [place], [place] [address]   Surgical pcr screen     Status: Abnormal   Collection Time: 10/22/18  2:58 PM  Result Value Ref Range Status   MRSA, PCR NEGATIVE NEGATIVE Final   Staphylococcus aureus POSITIVE (A) NEGATIVE Final    Comment: (NOTE) The Xpert SA Assay (FDA approved for NASAL specimens in patients 74 years of age and older), is one component of a comprehensive surveillance program. It is not intended to diagnose infection nor to guide or monitor treatment. Performed at [facility], [place] [address] [place],  [address]      Studies: Mr Foot Left W Wo Contrast  Result Date: 10/22/2018 CLINICAL DATA:  Left foot gangrene. EXAM: MRI OF THE LEFT FOREFOOT WITHOUT AND WITH CONTRAST TECHNIQUE: Multiplanar, multisequence MR imaging of the left forefoot was performed both before and after administration of intravenous contrast. CONTRAST:  7 mL Gadavist intravenous contrast. COMPARISON:  Left foot x-rays from yesterday. FINDINGS: Bones/Joint/Cartilage No marrow signal abnormality. No acute fracture or dislocation. Chronic midfoot collapse. No joint effusion. Ligaments Collateral ligaments are intact. Muscles and Tendons Increased T2 signal within the intrinsic muscles of the forefoot, nonspecific, but likely related to diabetic muscle changes. Soft tissue Diffuse soft tissue swelling. No fluid collection or hematoma. No soft tissue mass. IMPRESSION: 1. Diffuse soft tissue swelling.  No osteomyelitis or abscess. 2. Neuropathic arthropathy with chronic midfoot collapse. Electronically Signed   By: Titus Dubin M.D.   On: 10/22/2018 17:04    Scheduled Meds: . aspirin EC  81 mg Oral Daily  . Chlorhexidine Gluconate Cloth  6 each Topical Daily  . collagenase   Topical Daily  . docusate sodium  100 mg Oral Daily  . heparin  5,000 Units Subcutaneous Q8H  . insulin aspart  0-15 Units Subcutaneous TID WC  . insulin aspart  0-5 Units Subcutaneous QHS  . insulin aspart  5 Units  Subcutaneous TID WC  . insulin glargine  20 Units Subcutaneous QPM  . multivitamin with minerals  1 tablet Oral Daily  . mupirocin ointment  1 application Nasal BID  . nutrition supplement (JUVEN)  1 packet Oral BID BM  . pantoprazole  40 mg Oral Daily  . pregabalin  100 mg Oral TID  . Ensure Max Protein  11 oz Oral QHS    Continuous Infusions: . sodium chloride 75 mL/hr at 10/23/18 2159  . cefTRIAXone (ROCEPHIN)  IV 1 g (10/23/18 2201)  . lactated ringers Stopped (10/23/18 1325)  . magnesium sulfate bolus IVPB    . metronidazole 500 mg (10/24/18 0541)  . vancomycin Stopped (10/22/18 1720)     Flora Lipps, MD  [facility] 10/24/2018

## 2018-10-24 NOTE — Progress Notes (Signed)
Bladder scanned for >999, catheterized for 300cc urine. Patient tolerated well.  Will continue to monitor.

## 2018-10-24 NOTE — Progress Notes (Signed)
Patient ID: Daniel Campos, male   DOB: 07/27/1955, 63 y.o.   MRN: [id number] Dressing intact on left above-knee amputation.  Reports good pain control.  Will remove dressing tomorrow.

## 2018-10-24 NOTE — Progress Notes (Signed)
Occupational Therapy Evaluation Patient Details Name: Daniel Campos MRN: [id number] DOB: 14-Jun-1955 Today's Date: 10/24/2018    History of Present Illness Daniel Campos is a 63 y.o. male with a history of diabetes, hypertension, PE in 2018. 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   Clinical Impression   Pt presented with description above. PTA pt PLOF Mod I in ADLs and lack of adherence to medication per siser. Pt lives with daughter but unable to receive the support needed to engage in ADLs as safely as possible. Pt currently requires assist in ADLs due to limited functions with deficits, pain, and instability in functional tasks. Pt will benefit from continued acute OT to address safely engagement and transition to CIR for additional therapy for safe transition to next venue. Sister prefers he receives the assistance as needed because there decreased support in home setting. OT will continue to follow acutely.     Follow Up Recommendations  CIR    Equipment Recommendations  Tub/shower bench    Recommendations for Other Services       Precautions / Restrictions Precautions Precautions: None Restrictions Weight Bearing Restrictions: No      Mobility Bed Mobility Overal bed mobility: Modified Independent             General bed mobility comments: Increased time due to BUE weakness and pain of LLE.  Transfers Overall transfer level: Needs assistance               General transfer comment: Further testing required for transfer    Balance Overall balance assessment: Needs assistance Sitting-balance support: Bilateral upper extremity supported(R foot supported) Sitting balance-Leahy Scale: Fair Sitting balance - Comments: Pt very hesitant in bed mobilltiy due to pain level and instability in movement.                                    ADL either performed or assessed with clinical judgement    ADL Overall ADL's : Needs assistance/impaired Eating/Feeding: Set up;Sitting   Grooming: Set up;Sitting       Lower Body Bathing: Moderate assistance;Cueing for safety;Sitting/lateral leans Lower Body Bathing Details (indicate cue type and reason): additional help due to current pain level.     Lower Body Dressing: Cueing for safety;Minimal assistance                 General ADL Comments: Due to high pain level functional transfer not performed. further testing required. Pt educated on providing tactile sensation to limb to decrease sensitivity.      Vision Baseline Vision/History: Wears glasses       Perception     Praxis      Pertinent Vitals/Pain Pain Assessment: Faces Faces Pain Scale: Hurts whole lot Pain Location: L leg Pain Descriptors / Indicators: Aching;Grimacing;Guarding Pain Intervention(s): Monitored during session;Repositioned;Limited activity within patient's tolerance     Hand Dominance     Extremity/Trunk Assessment Upper Extremity Assessment Upper Extremity Assessment: Generalized weakness   Lower Extremity Assessment Lower Extremity Assessment: Defer to PT evaluation       Communication Communication Communication: HOH   Cognition Arousal/Alertness: Suspect due to medications Behavior During Therapy: Restless;Flat affect Overall Cognitive Status: Impaired/Different from baseline Area of Impairment: Memory;Following commands(Ox3)                     Memory: Decreased short-term memory  Following Commands: Follows one step commands consistently       General Comments: Pt highly confused with complex questions, requires simple questions and time to respond. Poor historian due to confusion.   General Comments  O2 sat RA 97%    Exercises     Shoulder Instructions      Home Living Family/patient expects to be discharged to:: Private residence Living Arrangements: Children   Type of Home: House Home Access: Stairs to  enter Technical brewer of Steps: 1 Entrance Stairs-Rails: Can reach both Home Layout: One level                   Additional Comments: Spoke with sister Daniel Campos) she mentions he will not have an ample amount of assistance if return to home setting.       Prior Functioning/Environment Level of Independence: Independent with assistive device(s)                 OT Problem List: Impaired balance (sitting and/or standing);Decreased safety awareness;Decreased knowledge of use of DME or AE;Pain;Impaired sensation      OT Treatment/Interventions: Self-care/ADL training;Therapeutic exercise;DME and/or AE instruction;Therapeutic activities;Patient/family education;Balance training    OT Goals(Current goals can be found in the care plan section) Acute Rehab OT Goals Patient Stated Goal: to receive as much help possible OT Goal Formulation: With family Time For Goal Achievement: 11/07/18 Potential to Achieve Goals: Fair  OT Frequency: Min 2X/week   Barriers to D/C: Inaccessible home environment;Decreased caregiver support  emergency contact concerns mentioned above. (check ADL comment)       Co-evaluation              AM-PAC OT "6 Clicks" Daily Activity     Outcome Measure Help from another person eating meals?: None Help from another person taking care of personal grooming?: A Little Help from another person toileting, which includes using toliet, bedpan, or urinal?: A Little Help from another person bathing (including washing, rinsing, drying)?: A Little Help from another person to put on and taking off regular upper body clothing?: None Help from another person to put on and taking off regular lower body clothing?: A Little 6 Click Score: 20   End of Session    Activity Tolerance: Patient limited by pain Patient left: in bed;with call bell/phone within reach;with bed alarm set  OT Visit Diagnosis: Unsteadiness on feet (R26.81);Muscle weakness (generalized)  (M62.81);Pain Pain - Right/Left: Left Pain - part of body: Leg                Time: 0981-1914 OT Time Calculation (min): 18 min Charges:  OT General Charges $OT Visit: 1 Visit OT Evaluation $OT Eval Low Complexity: [place], MSOT, OTR/L  Supplemental Rehabilitation Services  [phone]  Marius Ditch 10/24/2018, 12:11 PM

## 2018-10-24 NOTE — Care Management Important Message (Signed)
Important Message  Patient Details  Name: Daniel Campos MRN: [id number] Date of Birth: 1956-01-10   Medicare Important Message Given:  Yes    Memory Argue 10/24/2018, 2:10 PM

## 2018-10-24 NOTE — Progress Notes (Signed)
Inpatient Diabetes Program Recommendations  AACE/ADA: New Consensus Statement on Inpatient Glycemic Control (2015)  Target Ranges:  Prepandial:   less than 140 mg/dL      Peak postprandial:   less than 180 mg/dL (1-2 hours)      Critically ill patients:  140 - 180 mg/dL   Lab Results  Component Value Date   GLUCAP 197 (H) 10/24/2018   HGBA1C 7.7 (H) 10/22/2018    Review of Glycemic Control Results for Daniel Campos, Daniel Campos (MRN [id number]) as of 10/24/2018 11:10  Ref. Range 10/23/2018 18:52 10/23/2018 21:43 10/24/2018 08:31  Glucose-Capillary Latest Ref Range: 70 - 99 mg/dL 221 (H) 213 (H) 197 (H)   Diabetes history: Type 2 DM Outpatient Diabetes medications: Lantus 15-25 units QHS, Novolog 5 units TID Current orders for Inpatient glycemic control: Novolog 5 units TID, Lantus 20 units QHS, Novolog 0-15 units TID, Novolog 0-5 units QHS  Inpatient Diabetes Program Recommendations:    Consider increasing Lantus to 22 units QHS.   Thanks, Bronson Curb, MSN, RNC-OB Diabetes Coordinator [phone] (8a-5p)

## 2018-10-24 NOTE — Progress Notes (Signed)
Bladder scanned for 464, catheterized for 400cc urine. Will continue to monitor.

## 2018-10-24 NOTE — Evaluation (Signed)
Physical Therapy Evaluation Patient Details Name: Daniel Campos MRN: [id number] DOB: 1956-04-21 Today's Date: 10/24/2018   History of Present Illness  Pt is a 63 y.o. male admitted 10/21/18 with LLE gangrene. S/p L AKA on 6/8. PMH includes DM, HTN, PE.  Clinical Impression  Pt presents with an overall decrease in functional mobility secondary to above. PTA, pt using RW and w/c for mobility, indep with ADLs; family available for PRN assist. Educ on precautions, positioning, therex, and importance of mobility. Today, pt able to perform lateral scoot transfer with min guard; pt declining further standing or transfer training due to pain, but expect pt will require increased assist to stand/pivot and initiate gait. Pt with poor insight into current deficits; he is unsure about the need for post-acute rehab despite max education. Recommend intensive CIR-level therapies to maximize functional mobility and independence prior to return home.    Follow Up Recommendations CIR;Supervision/Assistance - 24 hour    Equipment Recommendations  (TBD)    Recommendations for Other Services       Precautions / Restrictions Precautions Precautions: Fall      Mobility  Bed Mobility Overal bed mobility: Modified Independent             General bed mobility comments: Significant increased time and effort; limited by LLE pain and poor attention, requiring frequent cues to redirect to task  Transfers Overall transfer level: Needs assistance   Transfers: Lateral/Scoot Transfers          Lateral/Scoot Transfers: Min guard General transfer comment: Assist for transfer set-up. Performed lateral scoot transfer from bed to drop-arm recliner towards R-side with min guard for safety; significant increased time and effort, heavy reliance on BUE support  Ambulation/Gait                Stairs            Wheelchair Mobility    Modified Rankin (Stroke Patients Only)       Balance  Overall balance assessment: Needs assistance Sitting-balance support: Bilateral upper extremity supported Sitting balance-Leahy Scale: Fair                                       Pertinent Vitals/Pain Pain Assessment: Faces Faces Pain Scale: Hurts even more Pain Location: L residual limb Pain Descriptors / Indicators: Aching;Grimacing;Guarding Pain Intervention(s): Monitored during session;Repositioned;Patient requesting pain meds-RN notified    Home Living Family/patient expects to be discharged to:: Private residence Living Arrangements: Children Available Help at Discharge: Family;Available PRN/intermittently   Home Access: Stairs to enter Entrance Stairs-Rails: Right;Left;Can reach both Entrance Stairs-Number of Steps: 1 Home Layout: One level Home Equipment: Walker - 2 wheels;Wheelchair - manual Additional Comments: Spoke with sister [name]) she mentions he will not have an ample amount of assistance if return to home setting.     Prior Function Level of Independence: Independent with assistive device(s)         Comments: Has been using w/c past few weeks due to foot pain. Indep to perform ADLs     Hand Dominance        Extremity/Trunk Assessment   Upper Extremity Assessment Upper Extremity Assessment: Generalized weakness    Lower Extremity Assessment Lower Extremity Assessment: Generalized weakness;LLE deficits/detail LLE Deficits / Details: L hip flexion functionally 3/5       Communication   Communication: HOH  Cognition Arousal/Alertness: Awake/alert Behavior During Therapy: Flat affect  Overall Cognitive Status: No family/caregiver present to determine baseline cognitive functioning Area of Impairment: Attention;Memory;Following commands;Safety/judgement;Awareness;Problem solving                   Current Attention Level: Selective Memory: Decreased short-term memory Following Commands: Follows one step commands with  increased time Safety/Judgement: Decreased awareness of safety;Decreased awareness of deficits Awareness: Emergent Problem Solving: Slow processing;Requires verbal cues        General Comments      Exercises     Assessment/Plan    PT Assessment Patient needs continued PT services  PT Problem List Decreased strength;Decreased range of motion;Decreased activity tolerance;Decreased balance;Decreased mobility;Decreased knowledge of use of DME;Decreased safety awareness;Pain;Decreased knowledge of precautions       PT Treatment Interventions DME instruction;Gait training;Stair training;Functional mobility training;Therapeutic activities;Therapeutic exercise;Balance training;Patient/family education    PT Goals (Current goals can be found in the Care Plan section)  Acute Rehab PT Goals Patient Stated Goal: to receive as much help possible; unsure about post-acute rehab PT Goal Formulation: With patient Time For Goal Achievement: 11/07/18    Frequency Min 3X/week   Barriers to discharge Decreased caregiver support      Co-evaluation               AM-PAC PT "6 Clicks" Mobility  Outcome Measure Help needed turning from your back to your side while in a flat bed without using bedrails?: None Help needed moving from lying on your back to sitting on the side of a flat bed without using bedrails?: None Help needed moving to and from a bed to a chair (including a wheelchair)?: A Little Help needed standing up from a chair using your arms (e.g., wheelchair or bedside chair)?: A Lot Help needed to walk in hospital room?: A Lot Help needed climbing 3-5 steps with a railing? : Total 6 Click Score: 16    End of Session   Activity Tolerance: Patient limited by pain Patient left: in chair;with call bell/phone within reach;with chair alarm set Nurse Communication: Mobility status PT Visit Diagnosis: Other abnormalities of gait and mobility (R26.89);Muscle weakness (generalized)  (M62.81)    Time: 3220-2542 PT Time Calculation (min) (ACUTE ONLY): 24 min   Charges:   PT Evaluation $PT Eval Moderate Complexity: 1 Mod PT Treatments $Therapeutic Activity: 8-22 mins   Mabeline Caras, PT, DPT Acute Rehabilitation Services  Pager [phone] Office [phone]  Derry Lory 10/24/2018, 5:28 PM

## 2018-10-24 NOTE — Progress Notes (Signed)
  Progress Note    10/24/2018 8:32 AM 1 Day Post-Op  Subjective:  Pain L AKA stump   Vitals:   10/24/18 0050 10/24/18 0522  BP: 130/69 (!) 141/79  Pulse: (!) 105 (!) 101  Resp: 14 14  Temp: 97.9 F (36.6 C) 97.8 F (36.6 C)  SpO2: 98% 98%    Physical Exam: Incisions:  Dressing left in place; no breakthrough bleeding/drainage   CBC    Component Value Date/Time   WBC 17.3 (H) 10/24/2018 0332   RBC 3.26 (L) 10/24/2018 0332   HGB 8.9 (L) 10/24/2018 0332   HCT 28.5 (L) 10/24/2018 0332   HCT 25.4 (L) 04/26/2017 1827   PLT 784 (H) 10/24/2018 0332   MCV 87.4 10/24/2018 0332   MCH 27.3 10/24/2018 0332   MCHC 31.2 10/24/2018 0332   RDW 14.6 10/24/2018 0332   LYMPHSABS 1.3 10/21/2018 1432   MONOABS 0.9 10/21/2018 1432   EOSABS 0.1 10/21/2018 1432   BASOSABS 0.0 10/21/2018 1432    BMET    Component Value Date/Time   NA 137 10/24/2018 0332   K 4.6 10/24/2018 0332   CL 107 10/24/2018 0332   CO2 14 (L) 10/24/2018 0332   GLUCOSE 198 (H) 10/24/2018 0332   BUN 21 10/24/2018 0332   CREATININE 1.65 (H) 10/24/2018 0332   CALCIUM 8.3 (L) 10/24/2018 0332   GFRNONAA 43 (L) 10/24/2018 0332   GFRAA 50 (L) 10/24/2018 0332    INR    Component Value Date/Time   INR 1.7 (H) 10/21/2018 1432     Intake/Output Summary (Last 24 hours) at 10/24/2018 0832 Last data filed at 10/24/2018 0541 Gross per 24 hour  Intake 1350 ml  Output 1165 ml  Net 185 ml     Assessment/Plan:  63 y.o. male is s/p left above knee amputation  1 Day Post-Op  - Dressing change tomorrow - Pain medication on schedule today - PT/OT to evaluate today   Dagoberto Ligas, PA-C [facility] [phone] 10/24/2018 8:32 AM

## 2018-10-24 NOTE — Progress Notes (Signed)
Patient bladder scanned. Urine greater than 999. In and out cath patient with output of 1090 mL.

## 2018-10-24 NOTE — Progress Notes (Signed)
Pharmacy Antibiotic Note  Daniel Campos is a 62 y.o. male admitted on 10/21/2018 with cellulitis.    S/p AKA - antibiotics to end 6/9  Plan: No change in antibiotics  Height: 5\' 11"  (180.3 cm) Weight: 170 lb (77.1 kg) IBW/kg (Calculated) : 75.3  Temp (24hrs), Avg:97.7 F (36.5 C), Min:97.5 F (36.4 C), Max:97.9 F (36.6 C)  Recent Labs  Lab 10/21/18 1432 10/21/18 1434 10/21/18 1535 10/22/18 0033 10/22/18 0616 10/23/18 0333 10/24/18 0332  WBC 18.6*  --   --  19.9*  --  16.1* 17.3*  CREATININE 1.16  --   --   --  1.37* 1.13 1.65*  LATICACIDVEN  --  1.0 0.8  --   --   --   --     Estimated Creatinine Clearance: 48.2 mL/min (A) (by C-G formula based on SCr of 1.65 mg/dL (H)).    No Known Allergies   Thank you for allowing pharmacy to be a part of this patient's care.  Tad Moore 10/24/2018 10:56 AM

## 2018-10-25 ENCOUNTER — Inpatient Hospital Stay (HOSPITAL_COMMUNITY)
Admission: RE | Admit: 2018-10-25 | Discharge: 2018-11-01 | DRG: 560 | Disposition: A | Discharge status: Home Health | Payer: Medicare Other | Source: Intra-hospital | Attending: Physical Medicine & Rehabilitation | Admitting: Physical Medicine & Rehabilitation

## 2018-10-25 ENCOUNTER — Other Ambulatory Visit: Payer: Self-pay

## 2018-10-25 ENCOUNTER — Encounter (HOSPITAL_COMMUNITY): Payer: Self-pay

## 2018-10-25 DIAGNOSIS — Z87891 Personal history of nicotine dependence: Secondary | ICD-10-CM

## 2018-10-25 DIAGNOSIS — Z86711 Personal history of pulmonary embolism: Secondary | ICD-10-CM | POA: Diagnosis not present

## 2018-10-25 DIAGNOSIS — E1151 Type 2 diabetes mellitus with diabetic peripheral angiopathy without gangrene: Secondary | ICD-10-CM | POA: Diagnosis not present

## 2018-10-25 DIAGNOSIS — R339 Retention of urine, unspecified: Secondary | ICD-10-CM | POA: Diagnosis not present

## 2018-10-25 DIAGNOSIS — E1142 Type 2 diabetes mellitus with diabetic polyneuropathy: Secondary | ICD-10-CM | POA: Diagnosis present

## 2018-10-25 DIAGNOSIS — D72829 Elevated white blood cell count, unspecified: Secondary | ICD-10-CM | POA: Diagnosis not present

## 2018-10-25 DIAGNOSIS — I10 Essential (primary) hypertension: Secondary | ICD-10-CM | POA: Diagnosis not present

## 2018-10-25 DIAGNOSIS — Z89612 Acquired absence of left leg above knee: Secondary | ICD-10-CM

## 2018-10-25 DIAGNOSIS — Z794 Long term (current) use of insulin: Secondary | ICD-10-CM

## 2018-10-25 DIAGNOSIS — E871 Hypo-osmolality and hyponatremia: Secondary | ICD-10-CM | POA: Diagnosis present

## 2018-10-25 DIAGNOSIS — N401 Enlarged prostate with lower urinary tract symptoms: Secondary | ICD-10-CM | POA: Diagnosis present

## 2018-10-25 DIAGNOSIS — S78112A Complete traumatic amputation at level between left hip and knee, initial encounter: Secondary | ICD-10-CM | POA: Diagnosis not present

## 2018-10-25 DIAGNOSIS — Z89619 Acquired absence of unspecified leg above knee: Secondary | ICD-10-CM | POA: Insufficient documentation

## 2018-10-25 DIAGNOSIS — R338 Other retention of urine: Secondary | ICD-10-CM | POA: Diagnosis present

## 2018-10-25 DIAGNOSIS — Z419 Encounter for procedure for purposes other than remedying health state, unspecified: Secondary | ICD-10-CM

## 2018-10-25 DIAGNOSIS — Z4781 Encounter for orthopedic aftercare following surgical amputation: Principal | ICD-10-CM

## 2018-10-25 DIAGNOSIS — R7309 Other abnormal glucose: Secondary | ICD-10-CM | POA: Diagnosis not present

## 2018-10-25 DIAGNOSIS — E162 Hypoglycemia, unspecified: Secondary | ICD-10-CM | POA: Diagnosis not present

## 2018-10-25 DIAGNOSIS — D649 Anemia, unspecified: Secondary | ICD-10-CM | POA: Diagnosis not present

## 2018-10-25 DIAGNOSIS — Z7982 Long term (current) use of aspirin: Secondary | ICD-10-CM

## 2018-10-25 DIAGNOSIS — K5900 Constipation, unspecified: Secondary | ICD-10-CM | POA: Diagnosis present

## 2018-10-25 DIAGNOSIS — Z6829 Body mass index (BMI) 29.0-29.9, adult: Secondary | ICD-10-CM | POA: Diagnosis not present

## 2018-10-25 DIAGNOSIS — E46 Unspecified protein-calorie malnutrition: Secondary | ICD-10-CM | POA: Diagnosis present

## 2018-10-25 DIAGNOSIS — I739 Peripheral vascular disease, unspecified: Secondary | ICD-10-CM | POA: Diagnosis not present

## 2018-10-25 LAB — CBC
HCT: 24.2 % — ABNORMAL LOW (ref 39.0–52.0)
Hemoglobin: 7.7 g/dL — ABNORMAL LOW (ref 13.0–17.0)
MCH: 27 pg (ref 26.0–34.0)
MCHC: 31.8 g/dL (ref 30.0–36.0)
MCV: 84.9 fL (ref 80.0–100.0)
Platelets: 683 10*3/uL — ABNORMAL HIGH (ref 150–400)
RBC: 2.85 MIL/uL — ABNORMAL LOW (ref 4.22–5.81)
RDW: 14.6 % (ref 11.5–15.5)
WBC: 13.1 10*3/uL — ABNORMAL HIGH (ref 4.0–10.5)
nRBC: 0 % (ref 0.0–0.2)

## 2018-10-25 LAB — COMPREHENSIVE METABOLIC PANEL
ALT: 11 U/L (ref 0–44)
AST: 16 U/L (ref 15–41)
Albumin: 1.6 g/dL — ABNORMAL LOW (ref 3.5–5.0)
Alkaline Phosphatase: 89 U/L (ref 38–126)
Anion gap: 8 (ref 5–15)
BUN: 17 mg/dL (ref 8–23)
CO2: 19 mmol/L — ABNORMAL LOW (ref 22–32)
Calcium: 7.9 mg/dL — ABNORMAL LOW (ref 8.9–10.3)
Chloride: 106 mmol/L (ref 98–111)
Creatinine, Ser: 0.79 mg/dL (ref 0.61–1.24)
GFR calc Af Amer: >60 mL/min (ref >60)
GFR calc non Af Amer: >60 mL/min (ref >60)
Glucose, Bld: 74 mg/dL (ref 70–99)
Potassium: 4.1 mmol/L (ref 3.5–5.1)
Sodium: 133 mmol/L — ABNORMAL LOW (ref 135–145)
Total Bilirubin: 0.2 mg/dL — ABNORMAL LOW (ref 0.3–1.2)
Total Protein: 5.5 g/dL — ABNORMAL LOW (ref 6.5–8.1)

## 2018-10-25 LAB — GLUCOSE, CAPILLARY
Glucose-Capillary: 43 mg/dL — CL (ref 70–99)
Glucose-Capillary: 62 mg/dL — ABNORMAL LOW (ref 70–99)
Glucose-Capillary: 81 mg/dL (ref 70–99)
Glucose-Capillary: 105 mg/dL — ABNORMAL HIGH (ref 70–99)
Glucose-Capillary: 67 mg/dL — ABNORMAL LOW (ref 70–99)
Glucose-Capillary: 54 mg/dL — ABNORMAL LOW (ref 70–99)
Glucose-Capillary: 161 mg/dL — ABNORMAL HIGH (ref 70–99)
Glucose-Capillary: 139 mg/dL — ABNORMAL HIGH (ref 70–99)

## 2018-10-25 LAB — MAGNESIUM: Magnesium: 1.3 mg/dL — ABNORMAL LOW (ref 1.7–2.4)

## 2018-10-25 MED ORDER — OXYCODONE-ACETAMINOPHEN 5-325 MG PO TABS: 1.0000 | ORAL_TABLET | ORAL | Status: DC | PRN

## 2018-10-25 MED ORDER — PANTOPRAZOLE SODIUM 40 MG PO TBEC
40.0000 mg | DELAYED_RELEASE_TABLET | Freq: Every day | ORAL | Status: DC
  Administered 2018-10-26 – 2018-11-01 (×7): 40 mg via ORAL
  Filled 2018-10-25 (×7): qty 1

## 2018-10-25 MED ORDER — JUVEN PO PACK
1.0000 | PACK | Freq: Two times a day (BID) | ORAL | Status: DC
  Administered 2018-10-26 – 2018-10-31 (×12): 1 via ORAL
  Filled 2018-10-25 (×14): qty 1

## 2018-10-25 MED ORDER — ADULT MULTIVITAMIN W/MINERALS CH
1.0000 | ORAL_TABLET | Freq: Every day | ORAL | Status: DC
  Administered 2018-10-26 – 2018-11-01 (×7): 1 via ORAL
  Filled 2018-10-25 (×7): qty 1

## 2018-10-25 MED ORDER — GUAIFENESIN-DM 100-10 MG/5ML PO SYRP: 5.0000 mL | ORAL_SOLUTION | Freq: Four times a day (QID) | ORAL | Status: DC | PRN

## 2018-10-25 MED ORDER — ENSURE MAX PROTEIN PO LIQD
11.0000 [oz_av] | Freq: Every day | ORAL | Status: DC
  Administered 2018-10-25: 11 [oz_av] via ORAL
  Filled 2018-10-25 (×2): qty 330

## 2018-10-25 MED ORDER — INSULIN GLARGINE 100 UNIT/ML ~~LOC~~ SOLN
15.0000 [IU] | Freq: Every evening | SUBCUTANEOUS | Status: DC
  Filled 2018-10-25: qty 0.15

## 2018-10-25 MED ORDER — BISACODYL 10 MG RE SUPP: 10.0000 mg | Freq: Every day | RECTAL | Status: DC | PRN

## 2018-10-25 MED ORDER — MUPIROCIN 2 % EX OINT
1.0000 "application " | TOPICAL_OINTMENT | Freq: Two times a day (BID) | CUTANEOUS | Status: AC
  Administered 2018-10-25 – 2018-10-27 (×5): 1 via NASAL
  Filled 2018-10-25 (×2): qty 22

## 2018-10-25 MED ORDER — POLYETHYLENE GLYCOL 3350 17 G PO PACK
17.0000 g | PACK | Freq: Every day | ORAL | Status: DC
  Administered 2018-10-25: 10:00:00 17 g via ORAL
  Filled 2018-10-25: qty 1

## 2018-10-25 MED ORDER — OXYCODONE-ACETAMINOPHEN 5-325 MG PO TABS
1.0000 | ORAL_TABLET | ORAL | Status: DC | PRN
  Administered 2018-10-25 – 2018-10-26 (×2): 2 via ORAL
  Administered 2018-10-26: 1 via ORAL
  Administered 2018-10-27 – 2018-10-30 (×12): 2 via ORAL
  Administered 2018-10-30: 18:00:00 1 via ORAL
  Administered 2018-10-31 (×3): 2 via ORAL
  Administered 2018-10-31: 1 via ORAL
  Administered 2018-11-01: 2 via ORAL
  Filled 2018-10-25: qty 2
  Filled 2018-10-25: qty 1
  Filled 2018-10-25 (×10): qty 2
  Filled 2018-10-25 (×2): qty 1
  Filled 2018-10-25 (×4): qty 2
  Filled 2018-10-25: qty 1
  Filled 2018-10-25 (×4): qty 2

## 2018-10-25 MED ORDER — PREGABALIN 100 MG PO CAPS
100.0000 mg | ORAL_CAPSULE | Freq: Three times a day (TID) | ORAL | Status: DC
  Administered 2018-10-25 – 2018-11-01 (×20): 100 mg via ORAL
  Filled 2018-10-25 (×21): qty 1

## 2018-10-25 MED ORDER — POLYETHYLENE GLYCOL 3350 17 G PO PACK
17.0000 g | PACK | Freq: Every day | ORAL | Status: DC | PRN
  Filled 2018-10-25: qty 1

## 2018-10-25 MED ORDER — COLLAGENASE 250 UNIT/GM EX OINT
TOPICAL_OINTMENT | Freq: Every day | CUTANEOUS | Status: DC
  Administered 2018-10-26 – 2018-10-30 (×5): via TOPICAL
  Filled 2018-10-25: qty 30

## 2018-10-25 MED ORDER — ASPIRIN EC 81 MG PO TBEC
81.0000 mg | DELAYED_RELEASE_TABLET | Freq: Every day | ORAL | Status: DC
  Administered 2018-10-26 – 2018-11-01 (×7): 81 mg via ORAL
  Filled 2018-10-25 (×8): qty 1

## 2018-10-25 MED ORDER — SENNOSIDES-DOCUSATE SODIUM 8.6-50 MG PO TABS
2.0000 | ORAL_TABLET | Freq: Every day | ORAL | Status: DC
  Administered 2018-10-25 – 2018-10-26 (×2): 2 via ORAL
  Filled 2018-10-25 (×2): qty 2

## 2018-10-25 MED ORDER — ENOXAPARIN SODIUM 40 MG/0.4ML ~~LOC~~ SOLN
40.0000 mg | SUBCUTANEOUS | Status: DC
  Administered 2018-10-25 – 2018-10-31 (×7): 40 mg via SUBCUTANEOUS
  Filled 2018-10-25 (×7): qty 0.4

## 2018-10-25 MED ORDER — DOCUSATE SODIUM 100 MG PO CAPS: 100.0000 mg | ORAL_CAPSULE | Freq: Every day | ORAL | 0 refills | Status: DC

## 2018-10-25 MED ORDER — PROCHLORPERAZINE 25 MG RE SUPP: 12.5000 mg | Freq: Four times a day (QID) | RECTAL | Status: DC | PRN

## 2018-10-25 MED ORDER — TRAMADOL HCL 50 MG PO TABS
50.0000 mg | ORAL_TABLET | Freq: Four times a day (QID) | ORAL | Status: DC | PRN
  Administered 2018-10-26 – 2018-10-31 (×6): 50 mg via ORAL
  Filled 2018-10-25 (×6): qty 1

## 2018-10-25 MED ORDER — INSULIN GLARGINE 100 UNIT/ML ~~LOC~~ SOLN: 15.0000 [IU] | Freq: Every evening | SUBCUTANEOUS | 11 refills | Status: DC

## 2018-10-25 MED ORDER — ALUM & MAG HYDROXIDE-SIMETH 200-200-20 MG/5ML PO SUSP: 30.0000 mL | ORAL | Status: DC | PRN

## 2018-10-25 MED ORDER — BISACODYL 5 MG PO TBEC: 5.0000 mg | DELAYED_RELEASE_TABLET | Freq: Every day | ORAL | 0 refills | Status: DC | PRN

## 2018-10-25 MED ORDER — TRAZODONE HCL 50 MG PO TABS
25.0000 mg | ORAL_TABLET | Freq: Every evening | ORAL | Status: DC | PRN
  Administered 2018-10-28 – 2018-10-31 (×3): 50 mg via ORAL
  Filled 2018-10-25 (×3): qty 1

## 2018-10-25 MED ORDER — PROCHLORPERAZINE EDISYLATE 10 MG/2ML IJ SOLN: 5.0000 mg | Freq: Four times a day (QID) | INTRAMUSCULAR | Status: DC | PRN

## 2018-10-25 MED ORDER — PROCHLORPERAZINE MALEATE 5 MG PO TABS
5.0000 mg | ORAL_TABLET | Freq: Four times a day (QID) | ORAL | Status: DC | PRN
  Administered 2018-10-26: 10 mg via ORAL
  Filled 2018-10-25: qty 2

## 2018-10-25 MED ORDER — GUAIFENESIN-DM 100-10 MG/5ML PO SYRP: 15.0000 mL | ORAL_SOLUTION | ORAL | 0 refills | Status: DC | PRN

## 2018-10-25 MED ORDER — POLYETHYLENE GLYCOL 3350 17 G PO PACK
17.0000 g | PACK | Freq: Every day | ORAL | Status: DC
  Administered 2018-10-26: 17 g via ORAL
  Filled 2018-10-25: qty 1

## 2018-10-25 MED ORDER — DOCUSATE SODIUM 100 MG PO CAPS
100.0000 mg | ORAL_CAPSULE | Freq: Every day | ORAL | Status: DC
  Administered 2018-10-26: 08:00:00 100 mg via ORAL
  Filled 2018-10-25 (×2): qty 1

## 2018-10-25 MED ORDER — DEXTROSE 50 % IV SOLN: 1.0000 | Freq: Once | INTRAVENOUS | Status: DC

## 2018-10-25 MED ORDER — DEXTROSE 50 % IV SOLN
25.0000 g | INTRAVENOUS | Status: AC
  Administered 2018-10-25: 19:00:00 25 g via INTRAVENOUS

## 2018-10-25 MED ORDER — METHOCARBAMOL 500 MG PO TABS
500.0000 mg | ORAL_TABLET | Freq: Four times a day (QID) | ORAL | Status: DC | PRN
  Administered 2018-10-28 – 2018-10-31 (×8): 500 mg via ORAL
  Filled 2018-10-25 (×8): qty 1

## 2018-10-25 MED ORDER — INSULIN ASPART 100 UNIT/ML ~~LOC~~ SOLN: 0.0000 [IU] | Freq: Every day | SUBCUTANEOUS | Status: DC

## 2018-10-25 MED ORDER — POLYETHYLENE GLYCOL 3350 17 G PO PACK: 17.0000 g | PACK | Freq: Every day | ORAL | 0 refills | Status: DC

## 2018-10-25 MED ORDER — DEXTROSE 50 % IV SOLN
INTRAVENOUS | Status: AC
  Administered 2018-10-25: 25 g via INTRAVENOUS
  Filled 2018-10-25: qty 50

## 2018-10-25 MED ORDER — INSULIN ASPART 100 UNIT/ML ~~LOC~~ SOLN
0.0000 [IU] | Freq: Three times a day (TID) | SUBCUTANEOUS | Status: DC
  Administered 2018-10-26: 2 [IU] via SUBCUTANEOUS
  Administered 2018-10-27: 13:00:00 11 [IU] via SUBCUTANEOUS
  Administered 2018-10-27 (×2): 5 [IU] via SUBCUTANEOUS
  Administered 2018-10-28: 3 [IU] via SUBCUTANEOUS
  Administered 2018-10-29 – 2018-10-31 (×3): 2 [IU] via SUBCUTANEOUS
  Administered 2018-10-31: 15 [IU] via SUBCUTANEOUS
  Administered 2018-10-31: 8 [IU] via SUBCUTANEOUS
  Administered 2018-11-01: 08:00:00 5 [IU] via SUBCUTANEOUS

## 2018-10-25 MED ORDER — FLEET ENEMA 7-19 GM/118ML RE ENEM
1.0000 | ENEMA | Freq: Once | RECTAL | Status: AC | PRN
  Administered 2018-10-26: 1 via RECTAL
  Filled 2018-10-25: qty 1

## 2018-10-25 MED ORDER — DIPHENHYDRAMINE HCL 12.5 MG/5ML PO ELIX: 12.5000 mg | ORAL_SOLUTION | Freq: Four times a day (QID) | ORAL | Status: DC | PRN

## 2018-10-25 MED ORDER — PHENOL 1.4 % MT LIQD: 1.0000 | OROMUCOSAL | Status: DC | PRN

## 2018-10-25 MED ORDER — INSULIN ASPART 100 UNIT/ML ~~LOC~~ SOLN
5.0000 [IU] | Freq: Three times a day (TID) | SUBCUTANEOUS | Status: DC
  Administered 2018-10-27 – 2018-11-01 (×13): 5 [IU] via SUBCUTANEOUS

## 2018-10-25 MED ORDER — ONDANSETRON HCL 4 MG PO TABS: 4.0000 mg | ORAL_TABLET | Freq: Four times a day (QID) | ORAL | 0 refills | Status: DC | PRN

## 2018-10-25 MED ORDER — ACETAMINOPHEN 325 MG PO TABS
325.0000 mg | ORAL_TABLET | ORAL | Status: DC | PRN
  Filled 2018-10-25: qty 2

## 2018-10-25 NOTE — H&P (Signed)
Physical Medicine and Rehabilitation Admission H&P        Chief Complaint  Patient presents with  . Functional deficits due to L-AKA      HPI: Daniel Campos is a 63 year old male with history of T2DM, PE, HTN who was admitted on 10/21/18 with 3 week history of non-healing LLE ulcers with calf pain that progressed to loss of sensation from knees down and gangrenous changes of foot and toes.  He was noted to be anemic with Hgb-6 and had leucocytosis with WBC-18. He was started on broad spectrum antibiotics and transfused with 2 units PRBC.  He was evaluated by Dr. Donzetta Matters who recommended L-AKA due to profound ischemia LLE.  WOC consulted for full thickness wound RLE felt to be due to trauma and recommended local care with santyl and daily dressing changes.  He underwent L-AKA on 06/08 by Dr. Donnetta Hutching and stump sock ordered by [facility].  He has had issue with urinary retention with volumes up to a l000 cc requiring I/O caths therefore foley placed, ABLA- Hgb down to 7.7, hyponatremia and labile blood sugars.  Therapy evaluations completed and CIR recommended due to functional decline.      Review of Systems  Constitutional: Negative for chills and fever.  Eyes: Negative for blurred vision and double vision.  Respiratory: Negative for cough and shortness of breath.   Cardiovascular: Negative for chest pain and leg swelling.  Gastrointestinal: Positive for constipation. Negative for heartburn and nausea.  Genitourinary: Negative for dysuria.  Musculoskeletal: Positive for back pain. Negative for myalgias.  Skin: Negative for itching and rash.  Neurological: Positive for weakness. Negative for headaches.            Past Medical History:  Diagnosis Date  . Diabetes mellitus without complication ([place])    . Hypertension    . Prostate hypertrophy    . Pulmonary emboli ([place]) 2018           Past Surgical History:  Procedure Laterality Date  . AMPUTATION Left 10/23/2018    Procedure: LEFT  AMPUTATION ABOVE KNEE;  Surgeon: Rosetta Posner, MD;  Location: [facility];  Service: Vascular;  Laterality: Left;  . BIOPSY   04/06/2018    Procedure: BIOPSY;  Surgeon: Daneil Dolin, MD;  [facility];  Service: Endoscopy;;  (gastric)  . COLONOSCOPY WITH PROPOFOL N/A 04/06/2018    Procedure: COLONOSCOPY WITH PROPOFOL;  Surgeon: Daneil Dolin, MD;  [facility];  Service: Endoscopy;  Laterality: N/A;  . ESOPHAGOGASTRODUODENOSCOPY (EGD) WITH PROPOFOL N/A 04/06/2018    Procedure: ESOPHAGOGASTRODUODENOSCOPY (EGD) WITH PROPOFOL;  Surgeon: Daneil Dolin, MD;  [facility];  Service: Endoscopy;  Laterality: N/A;  . INTRAMEDULLARY (IM) NAIL INTERTROCHANTERIC Right 12/06/2012    Procedure: INTRAMEDULLARY (IM) NAIL INTERTROCHANTRIC;  Surgeon: Carole Civil, MD;  Location[facility];  Service: Orthopedics;  Laterality: Right;  after lunch   . KNEE SURGERY      . POLYPECTOMY   04/06/2018    Procedure: POLYPECTOMY;  Surgeon: Daneil Dolin, MD;  [facility];  Service: Endoscopy;;  colon  . SHOULDER SURGERY               Family History  Problem Relation Age of Onset  . Heart attack Father    . Colon polyps Neg Hx    . Colon cancer Neg Hx        Social History:  Lives alone. Per reports that he has  quit smoking. His smoking use included e-cigarettes. He smoked 1.00 pack per day. He has never used smokeless tobacco. Per reports  he does not drink alcohol or use drugs.      Allergies: No Known Allergies            Medications Prior to Admission  Medication Sig Dispense Refill  . aspirin 81 MG tablet Take 1 tablet (81 mg total) by mouth daily. Resume on 04/12/18 30 tablet    . B Complex-C (B-COMPLEX WITH VITAMIN C) tablet Take 1 tablet by mouth daily.      . capsaicin (ZOSTRIX) 0.025 % cream Apply 1 application topically 3 (three) times daily.      . Cholecalciferol (VITAMIN D PO) Take 1 tablet by mouth daily.      . insulin glargine (LANTUS) 100  UNIT/ML injection Inject 15-25 Units into the skin every evening. Injects by personal sliding scale      . NOVOLOG FLEXPEN 100 UNIT/ML FlexPen Inject 5 Units into the skin 3 (three) times daily with meals.       Marland Kitchen omeprazole (PRILOSEC) 40 MG capsule Take 40 mg by mouth every morning.      . pregabalin (LYRICA) 100 MG capsule Take 100 mg by mouth 3 (three) times daily.          Drug Regimen Review  Drug regimen was reviewed and remains appropriate with no significant issues identified   Home: Home Living Family/patient expects to be discharged to:: Private residence Living Arrangements: Children Available Help at Discharge: Family, Available PRN/intermittently Type of Home: House Home Access: Stairs to enter [employer] of Steps: 1 Entrance Stairs-Rails: Right, Left, Can reach both Home Layout: One level Home Equipment: Environmental consultant - 2 wheels, Wheelchair - manual Additional Comments: Spoke with sister [name]) she mentions he will not have an ample amount of assistance if return to home setting.    Functional History: Prior Function Level of Independence: Independent with assistive device(s) Comments: Has been using w/c past few weeks due to foot pain. Indep to perform ADLs   Functional Status:  Mobility: Bed Mobility Overal bed mobility: Modified Independent General bed mobility comments: Significant increased time and effort; limited by LLE pain and poor attention, requiring frequent cues to redirect to task Transfers Overall transfer level: Needs assistance Transfers: Lateral/Scoot Transfers  Lateral/Scoot Transfers: Min guard General transfer comment: Assist for transfer set-up. Performed lateral scoot transfer from bed to drop-arm recliner towards R-side with min guard for safety; significant increased time and effort, heavy reliance on BUE support   ADL: ADL Overall ADL's : Needs assistance/impaired Eating/Feeding: Set up, Sitting Grooming: Set up, Sitting Lower  Body Bathing: Moderate assistance, Cueing for safety, Sitting/lateral leans Lower Body Bathing Details (indicate cue type and reason): additional help due to current pain level. Lower Body Dressing: Cueing for safety, Minimal assistance General ADL Comments: Due to high pain level functional transfer not performed. further testing required. Pt educated on providing tactile sensation to limb to decrease sensitivity.    Cognition: Cognition Overall Cognitive Status: No family/caregiver present to determine baseline cognitive functioning Orientation Level: Oriented to person, Oriented to place, Oriented to situation, Disoriented to time Cognition Arousal/Alertness: Awake/alert Behavior During Therapy: Flat affect Overall Cognitive Status: No family/caregiver present to determine baseline cognitive functioning Area of Impairment: Attention, Memory, Following commands, Safety/judgement, Awareness, Problem solving Current Attention Level: Selective Memory: Decreased short-term memory Following Commands: Follows one step commands with increased time Safety/Judgement: Decreased awareness of safety, Decreased awareness of deficits  Awareness: Emergent Problem Solving: Slow processing, Requires verbal cues General Comments: Pt highly confused with complex questions, requires simple questions and time to respond. Poor historian due to confusion.     Blood pressure 128/69, pulse (!) 106, temperature 98.2 F (36.8 C), temperature source Oral, resp. rate 18, height 5\' 11"  (1.803 m), weight 77.1 kg, SpO2 93 %. Physical Exam  Nursing note and vitals reviewed. Constitutional: He is oriented to person, place, and time. He appears well-developed and well-nourished. No distress.  GI: He exhibits no distension. There is no abdominal tenderness.  Positive bowel sounds Chest clear to auscultation no rales or rhonchi no wheezing no respiratory distress. Genitourinary:    Genitourinary Comments: Foley in place.     Musculoskeletal:     Comments: Multiple healed scars right knee. Dry dressing right ankle.   Neurological: He is alert and oriented to person, place, and time.  Flat affect. Difficulty to engage. Delayed processing--?HOH.   Skin: He is not diaphoretic.   Motor strength is 5/5 bilateral deltoid bicep tricep grip Right lower extremity has 4/5 strength in hip flexor knee extensor ankle dorsiflexor Left lower extremity status post left AKA with 3- hip flexion limited by pain  Sensation intact light touch right foot Lab Results Last 48 Hours        Results for orders placed or performed during the hospital encounter of 10/21/18 (from the past 48 hour(s))  Glucose, capillary     Status: Abnormal    Collection Time: 10/23/18  1:52 PM  Result Value Ref Range    Glucose-Capillary 161 (H) 70 - 99 mg/dL  Glucose, capillary     Status: Abnormal    Collection Time: 10/23/18  3:42 PM  Result Value Ref Range    Glucose-Capillary 185 (H) 70 - 99 mg/dL  Glucose, capillary     Status: Abnormal    Collection Time: 10/23/18  6:52 PM  Result Value Ref Range    Glucose-Capillary 221 (H) 70 - 99 mg/dL  Glucose, capillary     Status: Abnormal    Collection Time: 10/23/18  9:43 PM  Result Value Ref Range    Glucose-Capillary 213 (H) 70 - 99 mg/dL  CBC     Status: Abnormal    Collection Time: 10/24/18  3:32 AM  Result Value Ref Range    WBC 17.3 (H) 4.0 - 10.5 K/uL    RBC 3.26 (L) 4.22 - 5.81 MIL/uL    Hemoglobin 8.9 (L) 13.0 - 17.0 g/dL    HCT 28.5 (L) 39.0 - 52.0 %    MCV 87.4 80.0 - 100.0 fL    MCH 27.3 26.0 - 34.0 pg    MCHC 31.2 30.0 - 36.0 g/dL    RDW 14.6 11.5 - 15.5 %    Platelets 784 (H) 150 - 400 K/uL    nRBC 0.0 0.0 - 0.2 %      Comment: Performed at [facility], [address], [place], [place] [address]  Comprehensive metabolic panel     Status: Abnormal    Collection Time: 10/24/18  3:32 AM  Result Value Ref Range    Sodium 137 135 - 145 mmol/L    Potassium 4.6 3.5 - 5.1  mmol/L    Chloride 107 98 - 111 mmol/L    CO2 14 (L) 22 - 32 mmol/L    Glucose, Bld 198 (H) 70 - 99 mg/dL    BUN 21 8 - 23 mg/dL    Creatinine, Ser 1.65 (H)  0.61 - 1.24 mg/dL    Calcium 8.3 (L) 8.9 - 10.3 mg/dL    Total Protein 5.9 (L) 6.5 - 8.1 g/dL    Albumin 1.7 (L) 3.5 - 5.0 g/dL    AST 13 (L) 15 - 41 U/L    ALT 10 0 - 44 U/L    Alkaline Phosphatase 104 38 - 126 U/L    Total Bilirubin 0.9 0.3 - 1.2 mg/dL    GFR calc non Af Amer 43 (L) >60 mL/min    GFR calc Af Amer 50 (L) >60 mL/min    Anion gap 16 (H) 5 - 15      Comment: Performed at [facility] [address] [place], [place] [address]  Magnesium     Status: Abnormal    Collection Time: 10/24/18  3:32 AM  Result Value Ref Range    Magnesium 1.5 (L) 1.7 - 2.4 mg/dL      Comment: Performed at [facility] [address], [place], [place] [address]  Glucose, capillary     Status: Abnormal    Collection Time: 10/24/18  8:31 AM  Result Value Ref Range    Glucose-Capillary 197 (H) 70 - 99 mg/dL  Glucose, capillary     Status: Abnormal    Collection Time: 10/24/18 12:39 PM  Result Value Ref Range    Glucose-Capillary 172 (H) 70 - 99 mg/dL  Glucose, capillary     Status: Abnormal    Collection Time: 10/24/18  5:11 PM  Result Value Ref Range    Glucose-Capillary 148 (H) 70 - 99 mg/dL  Glucose, capillary     Status: None    Collection Time: 10/24/18 10:09 PM  Result Value Ref Range    Glucose-Capillary 98 70 - 99 mg/dL  CBC     Status: Abnormal    Collection Time: 10/25/18  1:36 AM  Result Value Ref Range    WBC 13.1 (H) 4.0 - 10.5 K/uL    RBC 2.85 (L) 4.22 - 5.81 MIL/uL    Hemoglobin 7.7 (L) 13.0 - 17.0 g/dL    HCT 24.2 (L) 39.0 - 52.0 %    MCV 84.9 80.0 - 100.0 fL    MCH 27.0 26.0 - 34.0 pg    MCHC 31.8 30.0 - 36.0 g/dL    RDW 14.6 11.5 - 15.5 %    Platelets 683 (H) 150 - 400 K/uL    nRBC 0.0 0.0 - 0.2 %      Comment: Performed at [facility], [place]. [address] [place], [place] [address]   Magnesium     Status: Abnormal    Collection Time: 10/25/18  1:36 AM  Result Value Ref Range    Magnesium 1.3 (L) 1.7 - 2.4 mg/dL      Comment: Performed at [facility] [address] [place], [place] [address]  Comprehensive metabolic panel     Status: Abnormal    Collection Time: 10/25/18  1:36 AM  Result Value Ref Range    Sodium 133 (L) 135 - 145 mmol/L    Potassium 4.1 3.5 - 5.1 mmol/L    Chloride 106 98 - 111 mmol/L    CO2 19 (L) 22 - 32 mmol/L    Glucose, Bld 74 70 - 99 mg/dL    BUN 17 8 - 23 mg/dL    Creatinine, Ser 0.79 0.61 - 1.24 mg/dL    Calcium 7.9 (L) 8.9 - 10.3 mg/dL    Total Protein  5.5 (L) 6.5 - 8.1 g/dL    Albumin 1.6 (L) 3.5 - 5.0 g/dL    AST 16 15 - 41 U/L    ALT 11 0 - 44 U/L    Alkaline Phosphatase 89 38 - 126 U/L    Total Bilirubin 0.2 (L) 0.3 - 1.2 mg/dL    GFR calc non Af Amer >60 >60 mL/min    GFR calc Af Amer >60 >60 mL/min    Anion gap 8 5 - 15      Comment: Performed at [facility] [address], [place], [place] [address]  Glucose, capillary     Status: Abnormal    Collection Time: 10/25/18  8:12 AM  Result Value Ref Range    Glucose-Capillary 43 (LL) 70 - 99 mg/dL    Comment 1 Notify RN    Glucose, capillary     Status: Abnormal    Collection Time: 10/25/18  9:08 AM  Result Value Ref Range    Glucose-Capillary 62 (L) 70 - 99 mg/dL  Glucose, capillary     Status: None    Collection Time: 10/25/18  9:49 AM  Result Value Ref Range    Glucose-Capillary 81 70 - 99 mg/dL      Imaging Results (Last 48 hours)  No results found.           Medical Problem List and Plan: 1.  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  Antithrombotics: -DVT/anticoagulation:  Pharmaceutical: Lovenox             -antiplatelet therapy: ASA 81mg  daily 3. Pain Management: On Lyrica tid. Change dilaudid to oxycodone prn.  4. Mood: LCSW to follow for evaluation and support.              -antipsychotic  agents: NA 5. Neuropsych: This patient is capable of making decisions on his  own behalf. 6. Skin/Wound Care: Pre-albumin 6.8--will add proteins supplements for malnutrition and to promote wound healing.  7. Fluids/Electrolytes/Nutrition: Monitor I/O--intake poor--lunch untouched.  Check lytes in am to follow up on hyponatremia.  8. 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 Donnetta Hutching in 4 weeks for staple removal  9. Acute on chronic anemia/H/o GIB: Continue to monitor stools. Augment bowel regimen. Serial check of H/H and transfuse if Hgb , 7.0.  10: HTN: Monitor BP tid 11. T2DM with peripheral neuropathy: Hgb A1C-7.7.  Lantus was decreased to 15 units today to avoid hypoglycemia.  12. Leucocytosis: Resolving from 18.6--> 13.1.  Monitor for signs of infection/fevers.  13. Urinary retention: foley placed today. Will check UA/UCS and start bladder program in am.  14. Constipation: Will augment bowel program.     Post Admission Physician Evaluation: 1. 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 2. Patient admitted to receive collaborative, interdisciplinary care between the physiatrist, rehab nursing staff, and therapy team. 3. Patient's level of medical complexity and substantial therapy needs in context of that medical necessity cannot be provided at a lesser intensity of care. 4. Patient has experienced substantial functional loss from his/her baseline.   Judging by the patient's diagnosis, physical exam, and functional history, the patient has potential for functional progress which will result in measurable gains while on inpatient rehab.  These gains will be of substantial and practical use upon discharge in facilitating mobility and self-care at the household level. 5. Physiatrist will provide 24 hour management of medical needs  as well as oversight of the therapy plan/treatment and provide guidance as appropriate regarding the  interaction of the two. 6. 24 hour rehab nursing will assist in the management of  bladder management, bowel management, safety, skin/wound care, disease management, medication administration, pain management and patient education  and help integrate therapy concepts, techniques,education, etc. 7. PT will assess and treat for:pre gait, gait training, endurance , safety, equipment, neuromuscular re education  .  Goals are: independent with assistive device. 8. OT will assess and treat for ADLs, Cognitive perceptual skills, Neuromuscular re education, safety, endurance, equipment  .  Goals are: supervision.  9. SLP will assess and treat for  .  Goals are: N/A. 10. Case Management and Social Worker will assess and treat for psychological issues and discharge planning. 11. Team conference will be held weekly to assess progress toward goals and to determine barriers to discharge. 12.  Patient will receive at least 3 hours of therapy per day at least 5 days per week. 13. ELOS and Prognosis: 7d good  "I have personally performed a face to face diagnostic evaluation of this patient.  Additionally, I have reviewed and concur with the physician assistant's documentation above."   [name] M.D. [facility] FAAPM&R (Sports Med, Neuromuscular Med) Diplomate [place] Board of [place], PA-C 10/25/2018

## 2018-10-25 NOTE — Progress Notes (Addendum)
[facility]  Subjective  - Doing well over all, pain in stump.   Objective 128/69 (!) 106 98.2 F (36.8 C) (Oral) 18 93%  Intake/Output Summary (Last 24 hours) at 10/25/2018 0824 Last data filed at 10/25/2018 4388 Gross per 24 hour  Intake 2857.04 ml  Output 1850 ml  Net 1007.04 ml    Left AKA incision healing well without erythema or edema Clean dry dressing placed  Assessment/Planning: POD # 2 AKA  Will order [employer] sock for stump Stump appears viable F/U with Dr. Donnetta Hutching in 4 weeks for staple removal.  Braylyn Kalter 10/25/2018 8:24 AM --  Laboratory Lab Results: Recent Labs    10/24/18 0332 10/25/18 0136  WBC 17.3* 13.1*  HGB 8.9* 7.7*  HCT 28.5* 24.2*  PLT 784* 683*   BMET Recent Labs    10/24/18 0332 10/25/18 0136  NA 137 133*  K 4.6 4.1  CL 107 106  CO2 14* 19*  GLUCOSE 198* 74  BUN 21 17  CREATININE 1.65* 0.79  CALCIUM 8.3* 7.9*    COAG Lab Results  Component Value Date   INR 1.7 (H) 10/21/2018   INR 1.12 12/06/2012   No results found for: PTT

## 2018-10-25 NOTE — Significant Event (Signed)
  Hypoglycemic Event  CBG: 67  Treatment: 4 oz juice/soda  Symptoms: None  Follow-up CBG: Time:1813 CBG Result: 54  Possible Reasons for Event: Unknown  Comments/MD notified:pt asymptomatic, drank 4 oz coke and graham pb crackers, pt fell asleep while eating crackers    Lonn Georgia M Burnett Spray

## 2018-10-25 NOTE — Progress Notes (Signed)
Report given to [facility] on 5N. Patient transferred to 5N17 with all belongings.

## 2018-10-25 NOTE — Progress Notes (Signed)
Hypoglycemic Event  CBG: 43  Treatment: 4 oz juice/soda  Symptoms: None  Follow-up CBG: Time:0908 CBG Result:62  Possible Reasons for Event: Unknown  Comments/MD notified:    Charyl Dancer

## 2018-10-25 NOTE — Progress Notes (Signed)
Hypoglycemic Event  CBG: 62  Treatment: 4 oz juice/soda  Symptoms: None  Follow-up CBG: [id number] CBG Result:81  Possible Reasons for Event: Unknown  Comments/MD notified:    Charyl Dancer

## 2018-10-25 NOTE — Discharge Summary (Signed)
Physician Discharge Summary  Daniel Campos OVF:[id number] DOB: 03-06-1956 DOA: 10/21/2018  PCP: [facility]  Admit date: 10/21/2018 Discharge date: 10/25/2018  Admitted From: Home  Discharge disposition: CIR   Recommendations for Outpatient Follow-Up:   Follow up with vascular surgery in  3 to 4 weeks to address statins and follow-up with your vascular surgery clinic   Discharge Diagnosis:   Principal Problem:   Gangrene of left foot ([place]) Active Problems:   Diabetes ([place])   Essential hypertension, benign   Acute anemia   History of pulmonary embolus (PE)   Heme positive stool  Discharge Condition: Improved.  Diet recommendation: Low sodium, heart healthy.  Carbohydrate-modified  Wound care: Amputation stump care  Code status: Full.   History of Present Illness:   63 y.o.malewith a history of diabetes, hypertension, pulmonary embolism in 2018 not currently on anticoagulation, presented to the hospital with approximately 3 weeks of nonhealing ulcerations to his left leg and foot. Initially, he had some pain in his calf and was seen at the [facility] office. He had an ultrasound that was negative for blood clot and was sent home. Patient continued to have multiple sores appearing to the left leg and foot that scabbed and turned black.  In the emergency department, patient had white count elevated to 18. Hemoglobin of 6. Blood cultures were obtained and the patient was started on vancomycin. Ceftriaxone and Flagyl were added. Patient tested negative for COVID. Patient received 2 units of blood in the ED. 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 MRI of the left foot showed  diffuse edema no osteomyelitis.Blood cultures  negative in 4 days.  Patient continues to be afebrile.  Trending down leukocytosis.  Patient will need to follow-up with Dr. Donnetta Hutching vascular surgery in 4 weeks for staple removal.  Acute anemia.  Patient history of GI bleed.  Continue Protonix. Patient received 2 units of packed RBC with appropriate rise in hemoglobin.  Hemoglobin of 7.7 today.  Will need to closely monitor.  Transfuse if hemoglobin is less than 7.  Diabetes mellitus type 2 with peripheral neuropathy.   on sliding scale insulin, Lantus Accu-Cheks, Lyrica.  Patient had mild hypoglycemia this morning.  Will closely monitor.  Will decrease the evening dose of Lantus to 15 units from 20 units.  Essential hypertension.  Blood pressure is reasonably controlled.  History of pulmonary embolus (PE); Not on anticoagulation currently.  Constipation.    Continue on bowel regimen.  Urinary retention.   Foley catheter for repeated episodes of urinary retention.  Consider voiding trial in a.m.  Disposition.  At this time, patient is stable for disposition to inpatient rehab.  Spoke with inpatient rehab provider.   Medical Consultants:    Vascular surgery, rehabilitation   Subjective:   Today, patient feels better with pain.  Denies any shortness of breath, cough, chest pain.  Patient had urinary retention issues overnight.  Has not had a bowel movement.  Discharge Exam:   Vitals:   10/24/18 2034 10/25/18 0519  BP: 138/86 128/69  Pulse: (!) 105 (!) 106  Resp: 17 18  Temp: 97.9 F (36.6 C) 98.2 F (36.8 C)  SpO2: 96% 93%   Vitals:   10/24/18 0522 10/24/18 1351 10/24/18 2034 10/25/18 8416  BP: (!) 141/79 (!) 142/82 138/86 128/69  Pulse: (!) 101 (!) 102 (!) 105 (!) 106  Resp: 14 20 17 18   Temp: 97.8 F (36.6 C) 97.8 F (36.6 C) 97.9 F (36.6 C) 98.2 F (36.8 C)  TempSrc: Oral Oral  Oral  SpO2: 98% 93% 96% 93%  Weight:      Height:       General exam: Appears calm  and comfortable ,Not in distress HEENT:PERRL,Oral mucosa moist Respiratory system: Bilateral equal air entry, normal vesicular breath sounds, no wheezes or crackles  Cardiovascular system: S1 & S2 heard, RRR.  Gastrointestinal system: Abdomen is nondistended, soft and nontender. No organomegaly or masses felt. Normal bowel sounds heard. Central nervous system: Alert and oriented. No focal neurological deficits. Extremities: Left AKA on ACE wrap. Skin: No rashes, lesions or ulcers,no icterus ,no pallor YQI:HKVQQV post left above-knee amputation.   Procedures:     Left above-knee amputation by vascular surgery on 10/23/2018  The results of significant diagnostics from this hospitalization (including imaging, microbiology, ancillary and laboratory) are listed below for reference.     Diagnostic Studies:   Dg Tibia/fibula Left  Result Date: 10/21/2018 CLINICAL DATA:  Chronic lateral lower leg wound. EXAM: LEFT TIBIA AND FIBULA - 2 VIEW COMPARISON:  None. FINDINGS: There is no evidence of fracture or other focal bone lesions. No osseous destruction or periosteal reaction. Osteopenia. Mild diffuse soft tissue swelling. IMPRESSION: 1. Mild diffuse soft tissue swelling.  No acute osseous abnormality. Electronically Signed   By: Titus Dubin M.D.   On: 10/21/2018 15:39   Mr Foot Left W Wo Contrast  Result Date: 10/22/2018 CLINICAL DATA:  Left foot gangrene. EXAM: MRI OF THE LEFT FOREFOOT WITHOUT AND WITH CONTRAST TECHNIQUE: Multiplanar, multisequence MR imaging of the left forefoot was performed both before and after administration of intravenous contrast. CONTRAST:  7 mL Gadavist intravenous contrast. COMPARISON:  Left foot x-rays from yesterday. FINDINGS: Bones/Joint/Cartilage No marrow signal abnormality. No acute fracture or dislocation. Chronic midfoot collapse. No joint effusion. Ligaments Collateral ligaments are intact. Muscles and Tendons Increased T2 signal within the intrinsic muscles of  the forefoot, nonspecific, but likely related to diabetic muscle changes. Soft tissue Diffuse soft tissue swelling. No fluid collection or hematoma. No soft tissue mass. IMPRESSION: 1. Diffuse soft tissue swelling.  No osteomyelitis or abscess. 2. Neuropathic arthropathy with chronic midfoot collapse. Electronically Signed   By: Titus Dubin M.D.   On: 10/22/2018 17:04   Dg Foot Complete Left  Result Date: 10/21/2018 CLINICAL DATA:  Toe gangrene. EXAM: LEFT FOOT - COMPLETE 3+ VIEW COMPARISON:  None. FINDINGS: No osseous destruction or periosteal reaction. No acute fracture. Healed fracture deformity of the first proximal phalanx. Chronic midfoot collapse with prominent bony hypertrophy. Mild first MTP joint osteoarthritis. Osteopenia. Diffuse soft tissue swelling. IMPRESSION: 1. Diffuse soft tissue swelling. No radiographic evidence of osteomyelitis. 2. Neuropathic arthropathy with chronic midfoot collapse. Electronically Signed   By: Titus Dubin M.D.   On: 10/21/2018 15:38     Labs:   Basic Metabolic Panel: Recent Labs  Lab 10/21/18 1432 10/22/18 0616 10/23/18 0333 10/24/18 0332 10/25/18 0136  NA 131* 131* 134* 137 133*  K 5.0 5.2* 4.7 4.6 4.1  CL 101 102 106 107 106  CO2 20* 12* 17* 14* 19*  GLUCOSE 194* 307* 170* 198* 74  BUN 18 21 16 21 17   CREATININE 1.16 1.37* 1.13 1.65* 0.79  CALCIUM 8.0* 7.9* 8.1* 8.3* 7.9*  MG  --   --   --  1.5* 1.3*   GFR Estimated Creatinine Clearance: 99.4 mL/min (by C-G formula based on SCr of 0.79 mg/dL). Liver Function Tests: Recent Labs  Lab 10/22/18 0616 10/24/18 0332 10/25/18 0136  AST 11* 13* 16  ALT 14 10 11   ALKPHOS 115 104 89  BILITOT 1.2 0.9 0.2*  PROT 5.7* 5.9* 5.5*  ALBUMIN 1.7* 1.7* 1.6*   No results for input(s): LIPASE, AMYLASE in the last 168 hours. No results for input(s): AMMONIA in the last 168 hours. Coagulation profile Recent Labs  Lab 10/21/18 1432  INR 1.7*    CBC: Recent Labs  Lab 10/21/18 1432  10/22/18 0033 10/23/18 0333 10/24/18 0332 10/25/18 0136  WBC 18.6* 19.9* 16.1* 17.3* 13.1*  NEUTROABS 16.2*  --   --   --   --   HGB 6.0* 8.7* 8.6* 8.9* 7.7*  HCT 19.0* 26.5* 26.5* 28.5* 24.2*  MCV 84.4 84.4 84.7 87.4 84.9  PLT 720* 712* 751* 784* 683*   Cardiac Enzymes: No results for input(s): CKTOTAL, CKMB, CKMBINDEX, TROPONINI in the last 168 hours. BNP: Invalid input(s): POCBNP CBG: Recent Labs  Lab 10/24/18 2209 10/25/18 0812 10/25/18 0908 10/25/18 0949 10/25/18 1218  GLUCAP 98 43* 62* 81 105*   D-Dimer No results for input(s): DDIMER in the last 72 hours. Hgb A1c No results for input(s): HGBA1C in the last 72 hours. Lipid Profile No results for input(s): CHOL, HDL, LDLCALC, TRIG, CHOLHDL, LDLDIRECT in the last 72 hours. Thyroid function studies No results for input(s): TSH, T4TOTAL, T3FREE, THYROIDAB in the last 72 hours.  Invalid input(s): FREET3 Anemia work up No results for input(s): VITAMINB12, FOLATE, FERRITIN, TIBC, IRON, RETICCTPCT in the last 72 hours. Microbiology Recent Results (from the past 240 hour(s))  Blood culture (routine x 2)     Status: None (Preliminary result)   Collection Time: 10/21/18  2:32 PM  Result Value Ref Range Status   Specimen Description LEFT ANTECUBITAL  Final   Special Requests   Final    BOTTLES DRAWN AEROBIC AND ANAEROBIC Blood Culture adequate volume   Culture   Final    NO GROWTH 4 DAYS Performed at [facility], [address] [place], [place] [address]    Report Status PENDING  Incomplete  Blood culture (routine x 2)     Status: None (Preliminary result)   Collection Time: 10/21/18  2:34 PM  Result Value Ref Range Status   Specimen Description BLOOD LEFT ARM  Final   Special Requests   Final    BOTTLES DRAWN AEROBIC AND ANAEROBIC Blood Culture adequate volume   Culture   Final    NO GROWTH 4 DAYS Performed at [facility], [address], [place], [place] [address]    Report Status PENDING  Incomplete  SARS  Coronavirus 2 (CEPHEID - Performed in [place] hospital lab), Hosp Order     Status: None   Collection Time: 10/21/18  3:38 PM  Result Value Ref Range Status   SARS Coronavirus 2 NEGATIVE NEGATIVE Final    Comment: (NOTE) If result is NEGATIVE SARS-CoV-2 target nucleic acids are NOT DETECTED. The SARS-CoV-2 RNA is generally detectable in upper and lower  respiratory specimens during the acute phase of infection. The lowest  concentration of SARS-CoV-2 viral copies this assay can detect is 250  copies / mL. A negative result does not preclude SARS-CoV-2 infection  and should not be used as the sole basis for treatment or other  patient management decisions.  A negative result may occur with  improper specimen collection / handling, submission of specimen other  than nasopharyngeal swab, presence of viral mutation(s) within the  areas targeted by this assay, and inadequate number of viral copies  (<250 copies / mL). A negative result must be combined with clinical  observations, patient history, and epidemiological information. If result is POSITIVE SARS-CoV-2 target nucleic acids are DETECTED. The SARS-CoV-2 RNA is generally detectable in upper and lower  respiratory specimens dur ing the acute phase of infection.  Positive  results are indicative of active infection with SARS-CoV-2.  Clinical  correlation with patient history and other diagnostic information is  necessary to determine patient infection status.  Positive results do  not rule out bacterial infection or co-infection with other viruses. If result is PRESUMPTIVE POSTIVE SARS-CoV-2 nucleic acids MAY BE PRESENT.   A presumptive positive result was obtained on the submitted specimen  and confirmed on repeat testing.  While 2019 novel coronavirus  (SARS-CoV-2) nucleic acids may be present in the submitted sample  additional confirmatory testing may be necessary for epidemiological  and / or clinical management purposes  to  differentiate between  SARS-CoV-2 and other Sarbecovirus currently known to infect humans.  If clinically indicated additional testing with an alternate test  methodology [phone]) is advised. The SARS-CoV-2 RNA is generally  detectable in upper and lower respiratory sp ecimens during the acute  phase of infection. The expected result is Negative. Fact Sheet for Patients:  StrictlyIdeas.no Fact Sheet for Healthcare Providers: BankingDealers.co.za This test is not yet approved or cleared by the [place] FDA and has been authorized for detection and/or diagnosis of SARS-CoV-2 by FDA under an Emergency Use Authorization (EUA).  This EUA will remain in effect (meaning this test can be used) for the duration of the COVID-19 declaration under Section 564(b)(1) of the Act, 21 U.S.C. section 360bbb-3(b)(1), unless the authorization is terminated or revoked sooner. Performed at [facility], [address], [place], [place] [address]   Surgical pcr screen     Status: Abnormal   Collection Time: 10/22/18  2:58 PM  Result Value Ref Range Status   MRSA, PCR NEGATIVE NEGATIVE Final   Staphylococcus aureus POSITIVE (A) NEGATIVE Final    Comment: (NOTE) The Xpert SA Assay (FDA approved for NASAL specimens in patients 78 years of age and older), is one component of a comprehensive surveillance program. It is not intended to diagnose infection nor to guide or monitor treatment. Performed at [facility], [place] [address] [place], [place] [address]      Discharge Instructions:   Discharge Instructions    Diet Carb Modified   Complete by:  As directed    Discharge instructions   Complete by:  As directed    Please follow-up with vascular surgery in 3 to 4 weeks for staples removal.   Increase activity slowly   Complete by:  As directed      Allergies as of 10/25/2018   No Known Allergies     Medication List    STOP taking these  medications   capsaicin 0.025 % cream Commonly known as:  ZOSTRIX     TAKE these medications   aspirin 81 MG tablet Take 1 tablet (81 mg total) by mouth daily. Resume on 04/12/18   B-complex with vitamin C tablet Take 1 tablet by mouth daily.   bisacodyl 5 MG EC tablet Commonly known as:  DULCOLAX Take 1 tablet (5 mg total) by mouth daily as needed for moderate constipation.  docusate sodium 100 MG capsule Commonly known as:  COLACE Take 1 capsule (100 mg total) by mouth daily. Start taking on:  October 26, 2018   guaiFENesin-dextromethorphan 100-10 MG/5ML syrup Commonly known as:  ROBITUSSIN DM Take 15 mLs by mouth every 4 (four) hours as needed for cough.   insulin glargine 100 UNIT/ML injection Commonly known as:  LANTUS Inject 0.15 mLs (15 Units total) into the skin every evening. Injects by personal sliding scale What changed:  how much to take   NovoLOG FlexPen 100 UNIT/ML FlexPen Generic drug:  insulin aspart Inject 5 Units into the skin 3 (three) times daily with meals.   omeprazole 40 MG capsule Commonly known as:  PRILOSEC Take 40 mg by mouth every morning.   ondansetron 4 MG tablet Commonly known as:  ZOFRAN Take 1 tablet (4 mg total) by mouth every 6 (six) hours as needed for nausea or vomiting.   oxyCODONE-acetaminophen 5-325 MG tablet Commonly known as:  PERCOCET/ROXICET Take 1-2 tablets by mouth every 4 (four) hours as needed for moderate pain.   polyethylene glycol 17 g packet Commonly known as:  MIRALAX / GLYCOLAX Take 17 g by mouth daily. Start taking on:  October 26, 2018   pregabalin 100 MG capsule Commonly known as:  LYRICA Take 100 mg by mouth 3 (three) times daily.   VITAMIN D PO Take 1 tablet by mouth daily.      Follow-up Information    Early, Arvilla Meres, MD Follow up in 4 week(s).   Specialties:  Vascular Surgery, Cardiology Why:  office will call with appt. Contact information: [address] [place] [address] [phone]             Time coordinating discharge: 39 minutes  Signed:  Rayleigh Gillyard  [facility] 10/25/2018, 1:06 PM

## 2018-10-25 NOTE — Progress Notes (Signed)
Pt transferred to unit via bed. Belongings at beside. Bed rails x 3. Bed alarm present and call bell placed in reach. Pt oriented to room and safety plan/precautions. Pt resting. Will cont to monitor.  Erie Noe, RN

## 2018-10-25 NOTE — Progress Notes (Signed)
PROGRESS NOTE  Daniel Campos FBP:[id number] DOB: 1955/12/24 DOA: 10/21/2018 PCP: [facility] [place]   LOS: 4 days   Brief narrative: 63 y.o.malewith a history of diabetes, hypertension, pulmonary embolism in 2018 not currently on anticoagulation, presented to the hospital with approximately 3 weeks of nonhealing ulcerations to his left leg and foot. Initially, he had some pain in his calf and was seen at the [facility] office. He had an ultrasound that was negative for blood clot and was sent home. Patient continued to have multiple sores appearing to the left leg and foot that scabbed and turned black.  In the emergency department, patient had white count elevated to 18. Hemoglobin of 6. Blood cultures were obtained and the patient was started on vancomycin. Ceftriaxone and Flagyl were added. Patient tested negative for COVID. Patient received 2 units of blood in the ED.  Patient was then admitted to the hospital for further evaluation and management of left lower extremity gangrene with superimposed cellulitis.   Subjective: Patient denies interval complaints.  Denies any overt pain, nausea, vomiting.  Has not had a bowel movement.  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 MRI of the left foot showed diffuse edema no osteomyelitis.Blood cultures  negative in 3 days.  Patient continues to be afebrile.  Trending down leukocytosis.  Patient will need to follow-up with Dr. early vascular surgery in 4 weeks for staple removal.  Acute anemia.  Patient history of GI bleed.  Patient received 2 units of packed RBC with appropriate rise in  hemoglobin.  Hemoglobin of 7.7 today.  Will closely monitor.  Transfuse if hemoglobin is less than 7.  Diabetes mellitus type 2 with peripheral neuropathy.  Continue on sliding scale insulin, Lantus Accu-Cheks, Lyrica.  Patient had mild hypoglycemia this morning.  Will closely monitor.  Will decrease the evening dose of Lantus to 15 units from 20 units.  Essential hypertension.  Blood pressure is reasonably controlled.  History of pulmonary embolus (PE); Not on anticoagulation currently.  Constipation.  We will put the patient on bowel regimen.  Urinary retention.  Will place in a Foley catheter for repeated episodes of urinary retention.  Consider voiding trial in a.m.  VTE Prophylaxis: Resume heparin subcu  Code Status: Full code  Family Communication: None  Disposition Plan: Likely to inpatient rehab.  Rehabilitation has been consulted.  Deciding on determination of disposition.   Consultants:  Vascular surgery  Procedures:  Left above-knee amputation by vascular surgery on 10/23/2018  Antibiotics: Anti-infectives (From admission, onward)   Start     Dose/Rate Route Frequency Ordered Stop   10/23/18 1045  metronidazole (FLAGYL) IVPB 500 mg     500 mg 100 mL/hr over 60 Minutes Intravenous To Surgery 10/23/18 1042 10/23/18 1215   10/22/18 1200  vancomycin (VANCOCIN) 1,500 mg in sodium chloride 0.9 % 500 mL IVPB     1,500 mg 250 mL/hr over 120 Minutes Intravenous Every 24 hours 10/21/18 2359 10/24/18 2359   10/21/18 2100  cefTRIAXone (ROCEPHIN) 1 g in sodium chloride 0.9 % 100 mL IVPB     1 g 200 mL/hr over 30 Minutes Intravenous Every 24 hours  10/21/18 1937 10/24/18 2201   10/21/18 2000  metroNIDAZOLE (FLAGYL) IVPB 500 mg     500 mg 100 mL/hr over 60 Minutes Intravenous Every 8 hours 10/21/18 1937 10/24/18 2359   10/21/18 1415  vancomycin (VANCOCIN) IVPB 1000 mg/200 mL premix     1,000 mg 200 mL/hr over 60 Minutes Intravenous  Once 10/21/18 1405 10/21/18 1725       Objective: Vitals:   10/24/18 2034 10/25/18 0519  BP: 138/86 128/69  Pulse: (!) 105 (!) 106  Resp: 17 18  Temp: 97.9 F (36.6 C) 98.2 F (36.8 C)  SpO2: 96% 93%    Intake/Output Summary (Last 24 hours) at 10/25/2018 0825 Last data filed at 10/25/2018 0743 Gross per 24 hour  Intake 2857.04 ml  Output 1850 ml  Net 1007.04 ml   Filed Weights   10/21/18 1336  Weight: 77.1 kg   Body mass index is 23.71 kg/m.   Physical Exam: GENERAL: Patient is alert awake and oriented. Not in obvious distress. HENT: Mild pallor noted. Pupils equally reactive to light. Oral mucosa is moist NECK: is supple, no palpable thyroid enlargement. CHEST: Clear to auscultation. No crackles or wheezes. Non tender on palpation. Diminished breath sounds bilaterally. CVS: S1 and S2 heard, no murmur. Regular rate and rhythm. No pericardial rub. ABDOMEN: Soft, non-tender, bowel sounds are present. No palpable hepato-splenomegaly. EXTREMITIES: Left above-knee amputation, on Ace wrap. CNS: Cranial nerves are intact.  Moves all extremities SKIN: Status post left above-knee amputation.  Data Review: I have personally reviewed the following laboratory data and studies,  CBC: Recent Labs  Lab 10/21/18 1432 10/22/18 0033 10/23/18 0333 10/24/18 0332 10/25/18 0136  WBC 18.6* 19.9* 16.1* 17.3* 13.1*  NEUTROABS 16.2*  --   --   --   --   HGB 6.0* 8.7* 8.6* 8.9* 7.7*  HCT 19.0* 26.5* 26.5* 28.5* 24.2*  MCV 84.4 84.4 84.7 87.4 84.9  PLT 720* 712* 751* 784* 010*   Basic Metabolic Panel: Recent Labs  Lab 10/21/18 1432 10/22/18 0616 10/23/18 0333 10/24/18 0332 10/25/18 0136  NA 131* 131* 134* 137 133*  K 5.0 5.2* 4.7 4.6 4.1  CL 101 102 106 107 106  CO2 20* 12* 17* 14* 19*  GLUCOSE 194* 307* 170* 198* 74  BUN 18 21 16 21 17   CREATININE 1.16 1.37* 1.13 1.65* 0.79  CALCIUM 8.0* 7.9* 8.1* 8.3* 7.9*  MG  --   --   --  1.5* 1.3*   Liver Function Tests: Recent Labs  Lab 10/22/18 0616 10/24/18 0332  10/25/18 0136  AST 11* 13* 16  ALT 14 10 11   ALKPHOS 115 104 89  BILITOT 1.2 0.9 0.2*  PROT 5.7* 5.9* 5.5*  ALBUMIN 1.7* 1.7* 1.6*   No results for input(s): LIPASE, AMYLASE in the last 168 hours. No results for input(s): AMMONIA in the last 168 hours. Cardiac Enzymes: No results for input(s): CKTOTAL, CKMB, CKMBINDEX, TROPONINI in the last 168 hours. BNP (last 3 results) No results for input(s): BNP in the last 8760 hours.  ProBNP (last 3 results) No results for input(s): PROBNP in the last 8760 hours.  CBG: Recent Labs  Lab 10/24/18 0831 10/24/18 1239 10/24/18 1711 10/24/18 2209 10/25/18 0812  GLUCAP 197* 172* 148* 98 43*   Recent Results (from the past 240 hour(s))  Blood culture (routine x 2)     Status: None (Preliminary result)   Collection Time: 10/21/18  2:32 PM  Result Value Ref Range Status   Specimen Description LEFT  ANTECUBITAL  Final   Special Requests   Final    BOTTLES DRAWN AEROBIC AND ANAEROBIC Blood Culture adequate volume   Culture   Final    NO GROWTH 4 DAYS Performed at [facility], [address], [place], [place] [address]    Report Status PENDING  Incomplete  Blood culture (routine x 2)     Status: None (Preliminary result)   Collection Time: 10/21/18  2:34 PM  Result Value Ref Range Status   Specimen Description BLOOD LEFT ARM  Final   Special Requests   Final    BOTTLES DRAWN AEROBIC AND ANAEROBIC Blood Culture adequate volume   Culture   Final    NO GROWTH 4 DAYS Performed at [facility] [address] [place], [place] [address]    Report Status PENDING  Incomplete  SARS Coronavirus 2 (CEPHEID - Performed in [facility] hospital lab), Hosp Order     Status: None   Collection Time: 10/21/18  3:38 PM  Result Value Ref Range Status   SARS Coronavirus 2 NEGATIVE NEGATIVE Final    Comment: (NOTE) If result is NEGATIVE SARS-CoV-2 target nucleic acids are NOT DETECTED. The SARS-CoV-2 RNA is generally detectable in upper and lower   respiratory specimens during the acute phase of infection. The lowest  concentration of SARS-CoV-2 viral copies this assay can detect is 250  copies / mL. A negative result does not preclude SARS-CoV-2 infection  and should not be used as the sole basis for treatment or other  patient management decisions.  A negative result may occur with  improper specimen collection / handling, submission of specimen other  than nasopharyngeal swab, presence of viral mutation(s) within the  areas targeted by this assay, and inadequate number of viral copies  (<250 copies / mL). A negative result must be combined with clinical  observations, patient history, and epidemiological information. If result is POSITIVE SARS-CoV-2 target nucleic acids are DETECTED. The SARS-CoV-2 RNA is generally detectable in upper and lower  respiratory specimens dur ing the acute phase of infection.  Positive  results are indicative of active infection with SARS-CoV-2.  Clinical  correlation with patient history and other diagnostic information is  necessary to determine patient infection status.  Positive results do  not rule out bacterial infection or co-infection with other viruses. If result is PRESUMPTIVE POSTIVE SARS-CoV-2 nucleic acids MAY BE PRESENT.   A presumptive positive result was obtained on the submitted specimen  and confirmed on repeat testing.  While 2019 novel coronavirus  (SARS-CoV-2) nucleic acids may be present in the submitted sample  additional confirmatory testing may be necessary for epidemiological  and / or clinical management purposes  to differentiate between  SARS-CoV-2 and other Sarbecovirus currently known to infect humans.  If clinically indicated additional testing with an alternate test  methodology [phone]) is advised. The SARS-CoV-2 RNA is generally  detectable in upper and lower respiratory sp ecimens during the acute  phase of infection. The expected result is Negative. Fact  Sheet for Patients:  StrictlyIdeas.no Fact Sheet for Healthcare Providers: BankingDealers.co.za This test is not yet approved or cleared by the [place] FDA and has been authorized for detection and/or diagnosis of SARS-CoV-2 by FDA under an Emergency Use Authorization (EUA).  This EUA will remain in effect (meaning this test can be used) for the duration of the COVID-19 declaration under Section 564(b)(1) of the Act, 21 U.S.C. section 360bbb-3(b)(1), unless the authorization is terminated or revoked sooner. Performed at [facility], [address], [place], [place] [address]   Surgical pcr screen     Status: Abnormal   Collection Time: 10/22/18  2:58 PM  Result Value Ref Range Status   MRSA, PCR NEGATIVE NEGATIVE Final   Staphylococcus aureus POSITIVE (A) NEGATIVE Final    Comment: (NOTE) The Xpert SA Assay (FDA approved for NASAL specimens in patients 45 years of age and older), is one component of a comprehensive surveillance program. It is not intended to diagnose infection nor to guide or monitor treatment. Performed at [facility], [place] [address] [place], [place] [address]      Studies: No results found.  Scheduled Meds: . aspirin EC  81 mg Oral Daily  . Chlorhexidine Gluconate Cloth  6 each Topical Daily  . collagenase   Topical Daily  . docusate sodium  100 mg Oral Daily  . heparin  5,000 Units Subcutaneous Q8H  . insulin aspart  0-15 Units Subcutaneous TID WC  . insulin aspart  0-5 Units Subcutaneous QHS  . insulin aspart  5 Units Subcutaneous TID WC  . insulin glargine  20 Units Subcutaneous QPM  . multivitamin with minerals  1 tablet Oral Daily  . mupirocin ointment  1 application Nasal BID  . nutrition supplement (JUVEN)  1 packet Oral BID BM  . pantoprazole  40 mg Oral Daily  . pregabalin  100 mg Oral TID  . Ensure Max Protein  11 oz Oral QHS    Continuous Infusions: . sodium chloride 75 mL/hr at  10/25/18 0743  . magnesium sulfate bolus IVPB       Flora Lipps, MD  [facility] 10/25/2018

## 2018-10-25 NOTE — Progress Notes (Signed)
Orthopedic Tech Progress Note Patient Details:  REXFORD PREVO May 19, 1955 [id number] Called in order to [facility] for a STUMP SOCK Patient ID: KAMARON DESKINS, male   DOB: 14-Aug-1955, 63 y.o.   MRN: [id number]   Janit Pagan 10/25/2018, 8:55 AM

## 2018-10-25 NOTE — Significant Event (Signed)
Hypoglycemic Event  CBG: 54  Treatment: D50 50 mL (25 gm)  Symptoms: None  Follow-up CBG: Time:1854 CBG Result: 161  Possible Reasons for Event: Inadequate meal intake  Comments/MD notified:Pam Love, PA notified, order for 1 amp D50. Will recheck CBG   Antaniya Venuti M Beaulah Romanek
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
## 2018-10-25 NOTE — Progress Notes (Signed)
Bladder scanned for 822cc, catheterized for 750cc urine. Patient tolerated well.  Will continue to monitor.

## 2018-10-25 NOTE — Progress Notes (Signed)
Inpatient Rehabilitation Admissions Coordinator  I met with patient at bedside. He is pleasantly confused but agreeable to admit to inpt rehab today to receive rehab before returning home with his daughter and son. I spoke his daughter, Michelle, by phone and she is in agreement to CIR admit. She states her Dad would never agree to SNF. I will contact Dr. Pokhrel for d/c arrangements and have notified SW, Isabel of planned admit to CIR. I will make the arrangements .   Barbara Boyette, RN, MSN Rehab Admissions Coordinator [phone] 10/25/2018 12:56 PM  

## 2018-10-25 NOTE — Progress Notes (Signed)
[name], MD  Physician  Physical Medicine and Rehabilitation  PMR Pre-admission  Signed  Date of Service:  10/25/2018 1:09 PM       Related encounter: ED to Hosp-Admission (Discharged) from 10/21/2018 in [facility]         Show:Clear all '[x]' Manual'[x]' Template'[x]' Copied  Added by: '[x]' Cristina Gong, RN'[x]' Kirsteins, Luanna Salk, MD  '[]' Hover for details PMR Admission Coordinator Pre-Admission Assessment  Patient: Daniel Campos is an 63 y.o., male MRN: [id number] DOB: 1955-06-01 Height: '5\' 11"'  (180.3 cm) Weight: 77.1 kg  Insurance Information HMO:     PPO:      PCP:      IPA:      80/20:      OTHER:  PRIMARY: Medicare a and b      Policy#: [id number]      Subscriber: pt Benefits:  Phone #: passport one online     Name: 6/10 Eff. Date: 05/18/2015     Deduct: $1408      Out of Pocket Max: none      Life Max: none CIR: 100%      SNF: 20 full days Outpatient: 80%     Co-Pay: 20% Home Health: 100%      Co-Pay: none DME: 80%     Co-Pay: 20% Providers: pt choice  SECONDARY: VA benefits       Medicaid Application Date:       Case Manager:  Disability Application Date:       Case Worker:   The "Data Collection Information Summary" for patients in Inpatient Rehabilitation Facilities with attached "Privacy Act [place] Records" was provided and verbally reviewed with: Family  Emergency Contact Information         Contact Information    Name Relation Home Work Mobile   Castana Sister   [phone]   Percell, Lamboy Daughter   [phone]   kutter, schnepf   [phone]   shimkus,pam Other   [phone]      Current Medical History  Patient Admitting Diagnosis: Left AKA  History of Present Illness: 63 year old male with history of T2DM, PE, HTN who was admitted on 10/21/18 with 3 week history of non-healing LLE ulcers with calf pain that progressed to loss of  sensation from knees down and gangrenous changes of foot and toes. He was noted to be anemic with Hgb-6 and had leucocytosis with WBC-18. He was started on broad spectrum antibiotics and transfused with 2 units PRBC. He was evaluated by Dr. Donzetta Matters who recommended L-AKA due to profound ischemia LLE. WOC consulted for full thickness wound RLE felt to be due to trauma and recommended local care with santyl and daily dressing changes. He underwent L-AKA on 06/08 by Dr. Donnetta Hutching and stump sock ordered by [facility]. He has had issue with urinary retention with volumes up to a l000 cc requiring I/O caths therefore foley placed, ABLA- Hgb down to 7.7, hyponatremia and labile blood sugars.    Patient's medical record from [facility]  has been reviewed by the rehabilitation admission coordinator and physician.  Past Medical History      Past Medical History:  Diagnosis Date  . Diabetes mellitus without complication ([place])   . Hypertension   . Prostate hypertrophy   . Pulmonary emboli ([place]) 2018    Family History   family history includes Heart attack in his father.  Prior Rehab/Hospitalizations Has the patient had prior rehab  or hospitalizations prior to admission? Yes  Has the patient had major surgery during 100 days prior to admission? Yes             Current Medications  Current Facility-Administered Medications:  .  0.9 %  sodium chloride infusion, , Intravenous, Continuous, Reg, Bircher, [place], Last Rate: 75 mL/hr at 10/25/18 0951 .  acetaminophen (TYLENOL) tablet 325-650 mg, 325-650 mg, Oral, Q4H PRN, 650 mg at 10/24/18 2026 **OR** acetaminophen (TYLENOL) suppository 325-650 mg, 325-650 mg, Rectal, Q4H PRN, Theda Sers, Emma M, PA-C .  alum & mag hydroxide-simeth (MAALOX/MYLANTA) 200-200-20 MG/5ML suspension 15-30 mL, 15-30 mL, Oral, Q2H PRN, Theda Sers, Emma M, PA-C .  aspirin EC tablet 81 mg, 81 mg, Oral, Daily, Leiam, Hopwood M, PA-C, 81 mg at 10/25/18 0951 .  bisacodyl  (DULCOLAX) EC tablet 5 mg, 5 mg, Oral, Daily PRN, Ulyses Amor, PA-C .  Chlorhexidine Gluconate Cloth 2 % PADS 6 each, 6 each, Topical, Daily, Juwann, Sherk, [place], 6 each at 10/25/18 9179 .  collagenase (SANTYL) ointment, , Topical, Daily, Cumbo, Emma M, PA-C .  docusate sodium (COLACE) capsule 100 mg, 100 mg, Oral, Daily, Neville, Walston M, PA-C, 100 mg at 10/25/18 0951 .  guaiFENesin-dextromethorphan (ROBITUSSIN DM) 100-10 MG/5ML syrup 15 mL, 15 mL, Oral, Q4H PRN, Theda Sers, Emma M, PA-C .  heparin injection 5,000 Units, 5,000 Units, Subcutaneous, Q8H, Linderman, Emma M, [place], 5,000 Units at 10/25/18 1505 .  hydrALAZINE (APRESOLINE) injection 5 mg, 5 mg, Intravenous, Q20 Min PRN, Theda Sers, Emma M, PA-C .  HYDROmorphone (DILAUDID) injection 0.5-1 mg, 0.5-1 mg, Intravenous, Q2H PRN, Tarris, Delbene M, PA-C, 1 mg at 10/24/18 1042 .  insulin aspart (novoLOG) injection 0-15 Units, 0-15 Units, Subcutaneous, TID WC, Toth, Emma M, PA-C, 2 Units at 10/24/18 1819 .  insulin aspart (novoLOG) injection 0-5 Units, 0-5 Units, Subcutaneous, QHS, Treyce, Spillers, [place], 2 Units at 10/23/18 2212 .  insulin aspart (novoLOG) injection 5 Units, 5 Units, Subcutaneous, TID WC, Ducey, Emma M, PA-C .  insulin glargine (LANTUS) injection 15 Units, 15 Units, Subcutaneous, QPM, Pokhrel, Laxman, MD .  labetalol (NORMODYNE) injection 10 mg, 10 mg, Intravenous, Q10 min PRN, Theda Sers, Emma M, PA-C .  magnesium sulfate IVPB 2 g 50 mL, 2 g, Intravenous, Daily PRN, Theda Sers, Emma M, PA-C .  metoprolol tartrate (LOPRESSOR) injection 2-5 mg, 2-5 mg, Intravenous, Q2H PRN, Theda Sers, Emma M, PA-C .  multivitamin with minerals tablet 1 tablet, 1 tablet, Oral, Daily, Arlynn, Mcdermid M, PA-C, 1 tablet at 10/25/18 [phone] .  mupirocin ointment (BACTROBAN) 2 % 1 application, 1 application, Nasal, BID, Armanii, Pressnell, [place], 1 application at 48/01/65 5374 .  nutrition supplement (JUVEN) (JUVEN) powder packet 1 packet, 1 packet, Oral, BID BM,  Artrell, Lawless, PA-C, 1 packet at 10/25/18 [phone] .  ondansetron (ZOFRAN) tablet 4 mg, 4 mg, Oral, Q6H PRN **OR** ondansetron (ZOFRAN) injection 4 mg, 4 mg, Intravenous, Q6H PRN, Theda Sers, Emma M, PA-C .  oxyCODONE-acetaminophen (PERCOCET/ROXICET) 5-325 MG per tablet 1-2 tablet, 1-2 tablet, Oral, Q4H PRN, Zubair, Lofton, PA-C, 2 tablet at 10/25/18 [phone] .  pantoprazole (PROTONIX) EC tablet 40 mg, 40 mg, Oral, Daily, Jarod, Bozzo M, PA-C, 40 mg at 10/25/18 0951 .  phenol (CHLORASEPTIC) mouth spray 1 spray, 1 spray, Mouth/Throat, PRN, Furia, Emma M, PA-C .  polyethylene glycol (MIRALAX / GLYCOLAX) packet 17 g, 17 g, Oral, Daily, Pokhrel, Laxman, MD, 17 g at 10/25/18 0951 .  potassium chloride SA (K-DUR) CR tablet 20-40 mEq, 20-40  mEq, Oral, Daily PRN, Bleu, Minerd M, PA-C .  pregabalin (LYRICA) capsule 100 mg, 100 mg, Oral, TID, Rogerick, Baldwin M, PA-C, 100 mg at 10/25/18 0951 .  protein supplement (ENSURE MAX) liquid, 11 oz, Oral, QHS, Hegwood, Emma M, PA-C, 11 oz at 10/23/18 2203 .  senna-docusate (Senokot-S) tablet 1 tablet, 1 tablet, Oral, QHS PRN, Ulyses Amor, PA-C  Patients Current Diet:     Diet Order                  Diet Carb Modified         Diet heart healthy/carb modified Room service appropriate? Yes; Fluid consistency: Thin  Diet effective now               Precautions / Restrictions Precautions Precautions: Fall Restrictions Weight Bearing Restrictions: No   Has the patient had 2 or more falls or a fall with injury in the past year? No  Prior Activity Level Limited Community (1-2x/wk): h/o pelvic fx 1 1/2 years ago. sicne 1 month pta, using wheelchair due to pain in LE. Mod I transfers from wheelchair and tolieting.I adls  Prior Functional Level Self Care: Did the patient need help bathing, dressing, using the toilet or eating? Independent  Indoor Mobility: Did the patient need assistance with walking from room to room (with or without device)?  Needed some help  Stairs: Did the patient need assistance with internal or external stairs (with or without device)? Needed some help  Functional Cognition: Did the patient need help planning regular tasks such as shopping or remembering to take medications? Needed some help  Home Assistive Devices / Equipment Home Assistive Devices/Equipment: CBG Meter, Environmental consultant (specify type), Wheelchair, Radio producer (specify quad or straight) Home Equipment: Walker - 2 wheels, Wheelchair - manual  Prior Device Use: Indicate devices/aids used by the patient prior to current illness, exacerbation or injury? Manual wheelchair  Current Functional Level Cognition  Overall Cognitive Status: Impaired/Different from baseline Current Attention Level: Selective Orientation Level: Oriented to person, Oriented to place, Oriented to situation, Disoriented to time Following Commands: Follows one step commands with increased time Safety/Judgement: Decreased awareness of safety, Decreased awareness of deficits General Comments: Pt highly confused with complex questions, requires simple questions and time to respond. Poor historian due to confusion.    Extremity Assessment (includes Sensation/Coordination)  Upper Extremity Assessment: Generalized weakness  Lower Extremity Assessment: Generalized weakness, LLE deficits/detail LLE Deficits / Details: L hip flexion functionally 3/5    ADLs  Overall ADL's : Needs assistance/impaired Eating/Feeding: Set up, Sitting Grooming: Set up, Sitting Lower Body Bathing: Moderate assistance, Cueing for safety, Sitting/lateral leans Lower Body Bathing Details (indicate cue type and reason): additional help due to current pain level. Lower Body Dressing: Cueing for safety, Minimal assistance General ADL Comments: Due to high pain level functional transfer not performed. further testing required. Pt educated on providing tactile sensation to limb to decrease sensitivity.      Mobility  Overal bed mobility: Modified Independent General bed mobility comments: Significant increased time and effort; limited by LLE pain and poor attention, requiring frequent cues to redirect to task    Transfers  Overall transfer level: Needs assistance Transfers: Lateral/Scoot Transfers  Lateral/Scoot Transfers: Min guard General transfer comment: Assist for transfer set-up. Performed lateral scoot transfer from bed to drop-arm recliner towards R-side with min guard for safety; significant increased time and effort, heavy reliance on BUE support    Ambulation / Gait / Stairs / Emergency planning/management officer  Posture / Balance Dynamic Sitting Balance Sitting balance - Comments: Pt very hesitant in bed mobilltiy due to pain level and instability in movement.  Balance Overall balance assessment: Needs assistance Sitting-balance support: Bilateral upper extremity supported Sitting balance-Leahy Scale: Fair Sitting balance - Comments: Pt very hesitant in bed mobilltiy due to pain level and instability in movement.     Special needs/care consideration BiPAP/CPAP  N/a CPM  N/a Continuous Drip IV  N/a Dialysis  N/a Life Vest  N/a Oxygen  N/a Special Bed n/a Trach Size  N/a Wound Vac n/a Skin  Surgical incision; dry skin Bowel mgmt: LBM 6/8 Bladder mgmt: retention needing I and O caths; indwelling catheter placed 6/10 Diabetic mgmt: Hgb A1c 7.7 Behavioral consideration mild confusion; very stubborn; family reports he is set in his ways Chemo/radiation  N/a   Previous Home Environment  Living Arrangements: (son and daughter live with pt)  Lives With: Son, Daughter(children live in pt's home) Available Help at Discharge: Family, Available 24 hours/day(daughter works, son does not work) Type of Home: [employer] Layout: One level Home Access: Stairs to enter Entrance Stairs-Rails: Right, Left, Can reach both Entrance Stairs-Number of Steps: 1 Bathroom Shower/Tub:  Tub/shower unit, Architectural technologist: Programmer, systems: Yes How Accessible: Accessible via wheelchair, Accessible via walker Home Care Services: No Additional Comments: spoke with sister, Belenda Cruise and daughter, Adele Barthel, about d/ c plan  Discharge Living Setting Plans for Discharge Living Setting: Patient's home, Lives with (comment)(son and daughter live with pt) Type of Home at Discharge: House Discharge Home Layout: One level Discharge Home Access: Stairs to enter Entrance Stairs-Rails: Right, Left, Can reach both Entrance Stairs-Number of Steps: 1 Discharge Bathroom Shower/Tub: Tub/shower unit, Curtain Discharge Bathroom Toilet: Standard Discharge Bathroom Accessibility: Yes How Accessible: Accessible via wheelchair, Accessible via walker Does the patient have any problems obtaining your medications?: No  Social/Family/Support Systems Patient Roles: Parent Contact Information: daughter and sister Anticipated Caregiver: daughter and son; sister in [place]. Anticipated Caregiver's Contact Information: see above Ability/Limitations of Caregiver: daughter works, but son there 24/7 Caregiver Availability: 24/7 Discharge Plan Discussed with Primary Caregiver: Yes Is Caregiver In Agreement with Plan?: Yes Does Caregiver/Family have Issues with Lodging/Transportation while Pt is in Rehab?: No  Goals/Additional Needs Patient/Family Goal for Rehab: Mod I with PT, OT, and SLP at wheelchair level Expected length of stay: ELOS 7 to 10 days Special Service Needs: Very HOH, per family very Stubborn and set in his ways Pt/Family Agrees to Admission and willing to participate: Yes Program Orientation Provided & Reviewed with Pt/Caregiver Including Roles  & Responsibilities: Yes  Decrease burden of Care through IP rehab admission: n/a  Possible need for SNF placement upon discharge:  Not anticipated  Patient Condition: I have reviewed medical records from [facility] , spoken with CSW, and patient, daughter and family member. I met with patient at the bedside for inpatient rehabilitation assessment.  Patient will benefit from ongoing PT, OT and SLP, can actively participate in 3 hours of therapy a day 5 days of the week, and can make measurable gains during the admission.  Patient will also benefit from the coordinated team approach during an Inpatient Acute Rehabilitation admission.  The patient will receive intensive therapy as well as Rehabilitation physician, nursing, social worker, and care management interventions.  Due to bladder management, bowel management, safety, skin/wound care, disease management, medication administration, pain management and patient education the patient requires 24 hour a day rehabilitation nursing.  The patient is currently  min to mod assist with mobility and basic ADLs.  Discharge setting and therapy post discharge at home with home health is anticipated.  Patient has agreed to participate in the Acute Inpatient Rehabilitation Program and will admit today.  Preadmission Screen Completed By:  Annamary Rummage MSN 10/25/2018 1:09 PM ______________________________________________________________________   Discussed status with Dr. Letta Pate on  10/25/2018 at  1316 and received approval for admission today.  Admission Coordinator:  Cleatrice Burke, RN, time  4098 Date  10/25/2018   Assessment/Plan: Diagnosis:Left AKA, PVD due to DM 1. Does the need for close, 24 hr/day Medical supervision in concert with the patient's rehab needs make it unreasonable for this patient to be served in a less intensive setting? Yes 2. Co-Morbidities requiring supervision/potential complications: HTN, Hx PE, RLE skin breakdown 3. Due to bladder management, bowel management, safety, skin/wound care, disease management, medication administration, pain management and patient education, does the patient require 24 hr/day rehab  nursing? Yes 4. Does the patient require coordinated care of a physician, rehab nurse, PT (1-2 hrs/day, 5 days/week) and OT (1-2 hrs/day, 5 days/week) to address physical and functional deficits in the context of the above medical diagnosis(es)? Yes Addressing deficits in the following areas: balance, endurance, locomotion, strength, transferring, bowel/bladder control, bathing, dressing, feeding, grooming, toileting and psychosocial support 5. Can the patient actively participate in an intensive therapy program of at least 3 hrs of therapy 5 days a week? Yes 6. The potential for patient to make measurable gains while on inpatient rehab is good 7. Anticipated functional outcomes upon discharge from inpatients are: modified independent PT, Mod I UE and Sup LE ADLs OT, n/a SLP 8. Estimated rehab length of stay to reach the above functional goals is: 7d 9. Anticipated D/C setting: Home 10. Anticipated post D/C treatments: [place] therapy 11. Overall Rehab/Functional Prognosis: good  MD Signature: [name] M.D. [facility] FAAPM&R (Sports Med, Neuromuscular Med) Diplomate Am Board of Electrodiagnostic Med         Revision History

## 2018-10-26 ENCOUNTER — Inpatient Hospital Stay (HOSPITAL_COMMUNITY): Payer: Non-veteran care

## 2018-10-26 ENCOUNTER — Inpatient Hospital Stay (HOSPITAL_COMMUNITY): Payer: Non-veteran care | Admitting: Occupational Therapy

## 2018-10-26 ENCOUNTER — Inpatient Hospital Stay (HOSPITAL_COMMUNITY): Payer: Non-veteran care | Admitting: Speech Pathology

## 2018-10-26 DIAGNOSIS — Z89619 Acquired absence of unspecified leg above knee: Secondary | ICD-10-CM

## 2018-10-26 LAB — CBC WITH DIFFERENTIAL/PLATELET
Abs Immature Granulocytes: 0.09 10*3/uL — ABNORMAL HIGH (ref 0.00–0.07)
Basophils Absolute: 0.1 10*3/uL (ref 0.0–0.1)
Basophils Relative: 0 %
Eosinophils Absolute: 0.1 10*3/uL (ref 0.0–0.5)
Eosinophils Relative: 1 %
HCT: 27 % — ABNORMAL LOW (ref 39.0–52.0)
Hemoglobin: 8.6 g/dL — ABNORMAL LOW (ref 13.0–17.0)
Immature Granulocytes: 1 %
Lymphocytes Relative: 7 %
Lymphs Abs: 1 10*3/uL (ref 0.7–4.0)
MCH: 26.8 pg (ref 26.0–34.0)
MCHC: 31.9 g/dL (ref 30.0–36.0)
MCV: 84.1 fL (ref 80.0–100.0)
Monocytes Absolute: 0.6 10*3/uL (ref 0.1–1.0)
Monocytes Relative: 5 %
Neutro Abs: 12.3 10*3/uL — ABNORMAL HIGH (ref 1.7–7.7)
Neutrophils Relative %: 86 %
Platelets: 747 10*3/uL — ABNORMAL HIGH (ref 150–400)
RBC: 3.21 MIL/uL — ABNORMAL LOW (ref 4.22–5.81)
RDW: 14.6 % (ref 11.5–15.5)
WBC: 14.1 10*3/uL — ABNORMAL HIGH (ref 4.0–10.5)
nRBC: 0 % (ref 0.0–0.2)

## 2018-10-26 LAB — COMPREHENSIVE METABOLIC PANEL
ALT: 11 U/L (ref 0–44)
AST: 15 U/L (ref 15–41)
Albumin: 1.6 g/dL — ABNORMAL LOW (ref 3.5–5.0)
Alkaline Phosphatase: 91 U/L (ref 38–126)
Anion gap: 7 (ref 5–15)
BUN: 12 mg/dL (ref 8–23)
CO2: 22 mmol/L (ref 22–32)
Calcium: 8.1 mg/dL — ABNORMAL LOW (ref 8.9–10.3)
Chloride: 104 mmol/L (ref 98–111)
Creatinine, Ser: 0.77 mg/dL (ref 0.61–1.24)
GFR calc Af Amer: >60 mL/min (ref >60)
GFR calc non Af Amer: >60 mL/min (ref >60)
Glucose, Bld: 111 mg/dL — ABNORMAL HIGH (ref 70–99)
Potassium: 4.4 mmol/L (ref 3.5–5.1)
Sodium: 133 mmol/L — ABNORMAL LOW (ref 135–145)
Total Bilirubin: 0.5 mg/dL (ref 0.3–1.2)
Total Protein: 5.6 g/dL — ABNORMAL LOW (ref 6.5–8.1)

## 2018-10-26 LAB — GLUCOSE, CAPILLARY
Glucose-Capillary: 85 mg/dL (ref 70–99)
Glucose-Capillary: 53 mg/dL — ABNORMAL LOW (ref 70–99)
Glucose-Capillary: 68 mg/dL — ABNORMAL LOW (ref 70–99)
Glucose-Capillary: 71 mg/dL (ref 70–99)
Glucose-Capillary: 129 mg/dL — ABNORMAL HIGH (ref 70–99)
Glucose-Capillary: 157 mg/dL — ABNORMAL HIGH (ref 70–99)

## 2018-10-26 LAB — CULTURE, BLOOD (ROUTINE X 2)
Culture: NO GROWTH
Culture: NO GROWTH
Special Requests: ADEQUATE
Special Requests: ADEQUATE

## 2018-10-26 LAB — MAGNESIUM: Magnesium: 1.2 mg/dL — ABNORMAL LOW (ref 1.7–2.4)

## 2018-10-26 MED ORDER — POLYETHYLENE GLYCOL 3350 17 G PO PACK
17.0000 g | PACK | Freq: Two times a day (BID) | ORAL | Status: DC
  Administered 2018-10-26: 19:00:00 17 g via ORAL
  Filled 2018-10-26 (×2): qty 1

## 2018-10-26 MED ORDER — ENSURE MAX PROTEIN PO LIQD
11.0000 [oz_av] | Freq: Two times a day (BID) | ORAL | Status: DC
  Administered 2018-10-26 – 2018-11-01 (×12): 11 [oz_av] via ORAL
  Filled 2018-10-26 (×12): qty 330

## 2018-10-26 MED ORDER — INSULIN GLARGINE 100 UNIT/ML ~~LOC~~ SOLN
12.0000 [IU] | Freq: Every evening | SUBCUTANEOUS | Status: DC
  Administered 2018-10-27 – 2018-10-29 (×3): 12 [IU] via SUBCUTANEOUS
  Filled 2018-10-26 (×5): qty 0.12

## 2018-10-26 MED ORDER — MAGNESIUM SULFATE 2 GM/50ML IV SOLN
2.0000 g | Freq: Once | INTRAVENOUS | Status: AC
  Administered 2018-10-26: 17:00:00 2 g via INTRAVENOUS
  Filled 2018-10-26: qty 50

## 2018-10-26 MED ORDER — MAGNESIUM OXIDE 400 (241.3 MG) MG PO TABS
200.0000 mg | ORAL_TABLET | Freq: Two times a day (BID) | ORAL | Status: DC
  Administered 2018-10-26 – 2018-10-27 (×2): 200 mg via ORAL
  Filled 2018-10-26 (×2): qty 1

## 2018-10-26 MED ORDER — LIDOCAINE HCL URETHRAL/MUCOSAL 2 % EX GEL
CUTANEOUS | Status: DC | PRN
  Filled 2018-10-26: qty 5

## 2018-10-26 NOTE — Progress Notes (Signed)
Social Work  Social Work Assessment and Plan  Patient Details  Name: Daniel Campos MRN: [id number] Date of Birth: 1955-06-10  Today's Date: 10/26/2018  Problem List:  Patient Active Problem List   Diagnosis Date Noted  . Status post above-knee amputation ([place]) 10/25/2018  . Unilateral AKA, left ([place]) 10/25/2018  . Gangrene of left foot ([place]) 10/21/2018  . Melena   . Heme positive stool 02/23/2018  . Bacteremia due to Gram-positive bacteria 04/29/2017  . History of pulmonary embolus (PE) 04/29/2017  . Acute urinary retention 04/27/2017  . ARF (acute renal failure) ([place]) 04/26/2017  . Tachycardia 04/26/2017  . Acute anemia 04/26/2017  . Hyponatremia 04/26/2017  . Loose stools 08/31/2016  . Encounter for screening colonoscopy 04/29/2016  . Rotator cuff tear 02/24/2013  . Stiffness of joint, not elsewhere classified, other specified site 01/16/2013  . Weakness of right leg 01/16/2013  . Fracture of right shoulder 01/04/2013  . Osteopenia 01/02/2013  . Intertrochanteric fracture of right hip ([place]) 12/20/2012  . Shoulder fracture 12/20/2012  . Essential hypertension, benign 10/16/2012  . Diabetes mellitus with peripheral vascular disease ([place]) 08/16/2012  . Prostate hypertrophy 08/16/2012   Past Medical History:  Past Medical History:  Diagnosis Date  . Diabetes mellitus without complication ([place])   . Hypertension   . Prostate hypertrophy   . Pulmonary emboli ([place]) 2018   Past Surgical History:  Past Surgical History:  Procedure Laterality Date  . AMPUTATION Left 10/23/2018   Procedure: LEFT AMPUTATION ABOVE KNEE;  Surgeon: Rosetta Posner, MD;  Location: [place] [facility];  Service: Vascular;  Laterality: Left;  . BIOPSY  04/06/2018   Procedure: BIOPSY;  Surgeon: Daneil Dolin, MD;  [facility];  Service: Endoscopy;;  (gastric)  . COLONOSCOPY WITH PROPOFOL N/A 04/06/2018   Procedure: COLONOSCOPY WITH PROPOFOL;  Surgeon: Daneil Dolin, MD;  [facility];   Service: Endoscopy;  Laterality: N/A;  . ESOPHAGOGASTRODUODENOSCOPY (EGD) WITH PROPOFOL N/A 04/06/2018   Procedure: ESOPHAGOGASTRODUODENOSCOPY (EGD) WITH PROPOFOL;  Surgeon: Daneil Dolin, MD;  [facility];  Service: Endoscopy;  Laterality: N/A;  . INTRAMEDULLARY (IM) NAIL INTERTROCHANTERIC Right 12/06/2012   Procedure: INTRAMEDULLARY (IM) NAIL INTERTROCHANTRIC;  Surgeon: Carole Civil, MD;  Location: [facility];  Service: Orthopedics;  Laterality: Right;  after lunch   . KNEE SURGERY    . POLYPECTOMY  04/06/2018   Procedure: POLYPECTOMY;  Surgeon: Daneil Dolin, MD;  [facility];  Service: Endoscopy;;  colon  . SHOULDER SURGERY     Social History:  reports that he has quit smoking. His smoking use included e-cigarettes. He smoked 1.00 pack per day. He has never used smokeless tobacco. He reports that he does not drink alcohol or use drugs.  Family / Support Systems Marital Status: Widow/Widower Patient Roles: Parent, Other (Comment)(Sibling) Children: Sharyn Lull daughter [phone]  John-son [phone]-cell Other Supports: [name] [phone] Anticipated Caregiver: Two children whom live with him Ability/Limitations of Caregiver: Daughter works but son is unemployed Careers adviser: 24/7 Family Dynamics: Close knit family both of his children live with him. He has a sister in [place] who calls daily. He has friends who are supportive and involved and will come by and check on him at home.  Social History Preferred language: English Religion: Catholic Cultural Background: No issues Education: HS Read: Yes Write: Yes Employment Status: Disabled Public relations account executive Issues: No issues Guardian/Conservator: None-according to MD pt is capable of making his own decisions while here  Abuse/Neglect Abuse/Neglect Assessment Can Be Completed: Yes Physical Abuse: Denies Verbal Abuse: Denies Sexual Abuse: Denies Exploitation of  patient/patient's resources: Denies Self-Neglect: Denies  Emotional Status Pt's affect, behavior and adjustment status: Pt is motivated to do well, but is having pain issues and is trying to push through this and not take anymore pain meds due to makes him loopy. He has always been one who could care for himself and he plans on doing this again. He knows it may take some time and is prepared to be pateint. Recent Psychosocial Issues: other health issues-been struggling with his leg for some time now and was using wheelchair at home prior to surgery Psychiatric History: No history deferred depression screen due to his pain issues. He would benefit from seeing neuro-psych while here for coping, since has been dealing with this issue for sometime now. Substance Abuse History: Smokes e-cigarettes plans on quiting. Is aware of the risks involved with them and interference with wound healing  Patient / Family Perceptions, Expectations & Goals Pt/Family understanding of illness & functional limitations: Pt is able to explain his amputation and issues regarding his pain. He does talk with the MD daily and is trying to do well and participate in therapies with therapists. He feels his questions and concerns are being addressed. Premorbid pt/family roles/activities: Father, friend, brother, friend, retireee, etc Anticipated changes in roles/activities/participation: resume Pt/family expectations/goals: Pt states: " I want to be as independent as I can be before going home, my kids will help."  Son states: " I will help him and be there if he needs me to help."  [employer]: None Premorbid Home Care/DME Agencies: Other (Comment)(has rw and wc using prior to admission) Transportation available at discharge: family members Resource referrals recommended: Neuropsychology, Support group (specify)  Discharge Planning Living Arrangements: Children Support Systems: Children, Other  relatives, Friends/neighbors Type of Residence: Private residence Insurance Resources: [employer] Financial Resources: SSD Financial Screen Referred: No Living Expenses: Own Money Management: Patient Does the patient have any problems obtaining your medications?: No Home Management: he and children Patient/Family Preliminary Plans: Return home with his two Ezequiel Essex is there all of the time due to unemployed and daughter works. He will have 24 hr care if needed. Will await therapy evaluations and work on discharge needs. Hopefully pt's pain can be managed and he can not be limited in therapies due to this. Social Work Anticipated Follow Up Needs: HH/OP, Support Group  Clinical Impression Pleasant gentleman in pain and trying to push through it without asking any more pain meds. He feels it make him loopy. He has supportive/involved children who will assist at discharge. Will await therapy team evaluations and work on discharge needs. He would benefit from seeing neuro-psych while here.  Elease Hashimoto 10/26/2018, 10:54 AM

## 2018-10-26 NOTE — Progress Notes (Signed)
[place] PHYSICAL MEDICINE & REHABILITATION PROGRESS NOTE   Subjective/Complaints:  Undergoing SLP evaluation  ROS- neg CP, SOB, N/V/D  Objective:   No results found. Recent Labs    10/25/18 0136 10/26/18 0351  WBC 13.1* 14.1*  HGB 7.7* 8.6*  HCT 24.2* 27.0*  PLT 683* 747*   Recent Labs    10/25/18 0136 10/26/18 0351  NA 133* 133*  K 4.1 4.4  CL 106 104  CO2 19* 22  GLUCOSE 74 111*  BUN 17 12  CREATININE 0.79 0.77  CALCIUM 7.9* 8.1*    Intake/Output Summary (Last 24 hours) at 10/26/2018 4580 Last data filed at 10/25/2018 1900 Gross per 24 hour  Intake -  Output 250 ml  Net -250 ml     Physical Exam: Vital Signs Blood pressure (!) 150/90, pulse (!) 109, temperature 98.2 F (36.8 C), temperature source Oral, resp. rate 20, height 5\' 11"  (1.803 m), weight 96.1 kg, SpO2 97 %. He isoriented to person, place, and time. He appearswell-developedand well-nourished.No distress.  GI: He exhibitsno distension. There isno abdominal tenderness.  Positive bowel sounds Chest clear to auscultation no rales or rhonchi no wheezing no respiratory distress. Genitourinary: Genitourinary Comments: Foley in place. Musculoskeletal:  Comments: Multiple healed scars right knee. Dry dressing right ankle. Neurological: He is alertand oriented to person, place, and time. Flat affect. Difficulty to engage. Delayed processing--+HOH. Skin: He isnot diaphoretic. 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 Functional deficits secondary to left AKA which require 3+ hours per day of interdisciplinary therapy in a comprehensive inpatient rehab setting.  Physiatrist is providing close team supervision and 24 hour management of  active medical problems listed below.  Physiatrist and rehab team continue to assess barriers to discharge/monitor patient progress toward functional and medical goals  Care Tool:  Bathing              Bathing assist       Upper Body Dressing/Undressing Upper body dressing        Upper body assist      Lower Body Dressing/Undressing Lower body dressing            Lower body assist       Toileting Toileting    Toileting assist Assist for toileting: Minimal Assistance - Patient > 75%     Transfers Chair/bed transfer  Transfers assist  Chair/bed transfer activity did not occur: Safety/medical concerns        Locomotion Ambulation   Ambulation assist              Walk 10 feet activity   Assist           Walk 50 feet activity   Assist           Walk 150 feet activity   Assist           Walk 10 feet on uneven surface  activity   Assist           Wheelchair     Assist               Wheelchair 50 feet with 2 turns activity    Assist            Wheelchair 150 feet activity  Assist           Medical Problem List and Plan: 1. 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 Antithrombotics: -DVT/anticoagulation:Pharmaceutical:Lovenox -antiplatelet therapy: ASA 81mg  daily 3. Pain Management:On Lyrica tid. Change dilaudid to oxycodone prn. 4. Mood:LCSW to follow for evaluation and support. -antipsychotic agents: NA 5. Neuropsych: This patientiscapable of making decisions on hisown behalf. 6. Skin/Wound Care:Pre-albumin 6.8--will add proteins supplements for malnutrition and to promote wound healing. 7. Fluids/Electrolytes/Nutrition:Monitor I/O--intake poor--lunch untouched.Check lytes in am to follow up on hyponatremia.  8. 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 Donnetta Hutching in 4  weeks for staple removal  9. Acute on chronic anemia/H/o GIB: Continue to monitor stools. Augment bowel regimen. Serial check of H/H and transfuse if Hgb , 7.0.  10: HTN: Monitor BP tid 11. T2DM with peripheral neuropathy: Hgb A1C-7.7.Lantus was decreased to 15 units today to avoid hypoglycemia. 12. Leucocytosis: Resolving from 18.6-->13.1, up to 14K on 6/11 afebrile. Monitor for signs of infection/fevers.  13. Urinary retention:foley placed today. Will check UA/UCS and start bladder program I/O cath if needed 14. Constipation:Will augment bowel program.    LOS: 1 days A FACE TO FACE EVALUATION WAS PERFORMED  [name] 10/26/2018, 6:05 AM

## 2018-10-26 NOTE — Progress Notes (Signed)
Patient ID: Daniel Campos, male   DOB: Jul 27, 1955, 63 y.o.   MRN: [id number] Sleeping comfortably.  Did not awaken.  Nurse reports some stump pain but controlled with analgesics.  Stump sock in place.  Will not follow actively.  We will see in the office in approximately 3 weeks for staple removal

## 2018-10-26 NOTE — Significant Event (Signed)
Hypoglycemic Event  CBG: 68  Treatment: 4 oz juice/soda  Symptoms: None  Follow-up CBG: Time:1300 CBG Result: 71  Possible Reasons for Event: Inadequate meal intake and Unknown  Comments/MD notified:PA notifed, pt drank 8oz juice and ate crackers. Lunch tray arrived and set up for pt. Calorie count ordered and hold lantus for tonight. Pt asymptomatic. Will cont to monitor.   Erie Noe, RN

## 2018-10-26 NOTE — Progress Notes (Signed)
Initial Nutrition Assessment  RD working remotely.  DOCUMENTATION CODES:   Not applicable  INTERVENTION:   - 48-hour calorie count ordered to begin today at dinner meal and end 6/13 after lunch meal. RD will provide the results of Day 1 tomorrow, 6/12. RD will provide the results of Day 2 on Monday, 6/15.  - Continue Ensure Max po daily, each supplement provides 150 kcal and 30 grams of protein  - Continue 1 packet Juven BID, each packet provides 95 calories, 2.5 grams of protein (collagen), and 9.8 grams of carbohydrate (3 grams sugar); also contains 7 grams of L-arginine and L-glutamine, 300 mg vitamin C, 15 mg vitamin E, 1.2 mcg vitamin B-12, 9.5 mg zinc, 200 mg calcium, and 1.5 g  Calcium Beta-hydroxy-Beta-methylbutyrate to support wound healing  - Continue MVI with minerals daily  NUTRITION DIAGNOSIS:   Increased nutrient needs related to wound healing, post-op healing as evidenced by estimated needs.  GOAL:   Patient will meet greater than or equal to 90% of their needs  MONITOR:   PO intake, Supplement acceptance, Labs, Weight trends, Skin  REASON FOR ASSESSMENT:   Consult Calorie Count  ASSESSMENT:   63 year old male with PMH of T2DM, PE, HTN who was admitted on 6/6 with 3 week history of non-healing LLE ulcers with calf pain that progressed to loss of sensation from knee down and gangrenous changes of foot and toes. Pt was evaluated by Dr. Donzetta Matters who recommended L-AKA due to profound ischemia LLE. WOC consulted for full thickness wound RLE felt to be due to trauma and recommended local care with santyl and daily dressing changes. Pt underwent L-AKA on 6/8 by Dr. Donnetta Hutching and stump sock ordered by [facility]. Pt admitted to CIR on 6/10.  Discussed calorie count purpose with RN. RN reports PA wanted calorie count due to pt having labile blood sugars and multiple lows overnight.  RD will provide results of Day 1 tomorrow, 6/12. RD unable to provide results of Day 2 until  Monday, 6/15.  Attempted to speak with pt via phone call to room. Despite RD changing location and raising voice, pt unable to understand what RD was saying, stating "it sounds like you are talking baby talk." Unable to elicit any information from pt at this time. Will attempt to obtain diet and weight history at follow-up.  Weight stable over the last 2 years. Weight on admission to CIR is well above weight on 10/21/18. Suspect scale error. Will monitor trends.  Reviewed RN edema assessment. Pt with non-pitting edema to LLE.  Meal Completion: 50% x 1 recorded meal (breakfast this AM)  Medications reviewed and include: Colace, SSI, Novolog 5 units TID, Lantus 12 units daily, MVI with minerals, Juven BID, Protonix, Miralax, Ensure Max daily, Senna  Labs reviewed: sodium 133, magnesium 1.2, hemoglobin 8.6 CBG's: 53-161 x 24 hours  NUTRITION - FOCUSED PHYSICAL EXAM:  Unable to complete at this time. RD working remotely.  Diet Order:   Diet Order            Diet heart healthy/carb modified Room service appropriate? Yes; Fluid consistency: Thin  Diet effective now              EDUCATION NEEDS:   No education needs have been identified at this time  Skin:  Skin Integrity Issues: Incisions: left leg s/p AKA Other: wound to right leg  Last BM:  10/23/18  Height:   Ht Readings from Last 1 Encounters:  10/25/18 5\' 11"  (1.803 m)  Weight:   Wt Readings from Last 1 Encounters:  10/25/18 96.1 kg    Ideal Body Weight:  71.9 kg (adjusted for L AKA)  BMI:  Body mass index is 29.55 kg/m.  Estimated Nutritional Needs:   Kcal:  2000-2200  Protein:  105-120 grams  Fluid:  >/= 2.0 L    Gaynell Face, MS, RD, LDN Inpatient Clinical Dietitian Pager: [phone] Weekend/After Hours: [phone]

## 2018-10-26 NOTE — Evaluation (Signed)
Occupational Therapy Assessment and Plan  Patient Details  Name: Daniel Campos MRN: [id number] Date of Birth: [birth year]  OT Diagnosis: acute pain and muscle weakness (generalized) Rehab Potential: Rehab Potential (ACUTE ONLY): Good ELOS: 7-10 days   Today's Date: 10/26/2018 OT Individual Time: 4492-0100 OT Individual Time Calculation (min): 40 min  and Today's Date: 10/26/2018 OT Missed Time: 20 Minutes Missed Time Reason: Pain    Problem List:  Patient Active Problem List   Diagnosis Date Noted  . Status post above-knee amputation ([place]) 10/25/2018  . Unilateral AKA, left ([place]) 10/25/2018  . Gangrene of left foot ([place]) 10/21/2018  . Melena   . Heme positive stool 02/23/2018  . Bacteremia due to Gram-positive bacteria 04/29/2017  . History of pulmonary embolus (PE) 04/29/2017  . Acute urinary retention 04/27/2017  . ARF (acute renal failure) ([place]) 04/26/2017  . Tachycardia 04/26/2017  . Acute anemia 04/26/2017  . Hyponatremia 04/26/2017  . Loose stools 08/31/2016  . Encounter for screening colonoscopy 04/29/2016  . Rotator cuff tear 02/24/2013  . Stiffness of joint, not elsewhere classified, other specified site 01/16/2013  . Weakness of right leg 01/16/2013  . Fracture of right shoulder 01/04/2013  . Osteopenia 01/02/2013  . Intertrochanteric fracture of right hip ([place]) 12/20/2012  . Shoulder fracture 12/20/2012  . Essential hypertension, benign 10/16/2012  . Diabetes mellitus with peripheral vascular disease ([place]) 08/16/2012  . Prostate hypertrophy 08/16/2012    Past Medical History:  Past Medical History:  Diagnosis Date  . Diabetes mellitus without complication ([place])   . Hypertension   . Prostate hypertrophy   . Pulmonary emboli ([place]) 2018   Past Surgical History:  Past Surgical History:  Procedure Laterality Date  . AMPUTATION Left 10/23/2018   Procedure: LEFT AMPUTATION ABOVE KNEE;  Surgeon: Rosetta Posner, MD;  Location: [facility];  Service: Vascular;   Laterality: Left;  . BIOPSY  04/06/2018   Procedure: BIOPSY;  Surgeon: Daneil Dolin, MD;  [facility];  Service: Endoscopy;;  (gastric)  . COLONOSCOPY WITH PROPOFOL N/A 04/06/2018   Procedure: COLONOSCOPY WITH PROPOFOL;  Surgeon: Daneil Dolin, MD;  [facility];  Service: Endoscopy;  Laterality: N/A;  . ESOPHAGOGASTRODUODENOSCOPY (EGD) WITH PROPOFOL N/A 04/06/2018   Procedure: ESOPHAGOGASTRODUODENOSCOPY (EGD) WITH PROPOFOL;  Surgeon: Daneil Dolin, MD;  [facility];  Service: Endoscopy;  Laterality: N/A;  . INTRAMEDULLARY (IM) NAIL INTERTROCHANTERIC Right 12/06/2012   Procedure: INTRAMEDULLARY (IM) NAIL INTERTROCHANTRIC;  Surgeon: Carole Civil, MD;  Location: [facility];  Service: Orthopedics;  Laterality: Right;  after lunch   . KNEE SURGERY    . POLYPECTOMY  04/06/2018   Procedure: POLYPECTOMY;  Surgeon: Daneil Dolin, MD;  [facility];  Service: Endoscopy;;  colon  . SHOULDER SURGERY      Assessment & Plan Clinical Impression: Daniel Campos is a 63 year old male with history of T2DM, PE, HTN who was admitted on 10/21/18 with 3 week history of non-healing LLE ulcers with calf pain that progressed to loss of sensation from knees down and gangrenous changes of foot and toes.  He was noted to be anemic with Hgb-6 and had leucocytosis with WBC-18. He was started on broad spectrum antibiotics and transfused with 2 units PRBC.  He was evaluated by Dr. Donzetta Matters who recommended L-AKA due to profound ischemia LLE.  WOC consulted for full thickness wound RLE felt to be due to trauma and recommended local care with santyl and daily dressing changes.  He underwent L-AKA on 06/08 by Dr. Donnetta Hutching and stump sock ordered by [facility].  He has had issue with urinary retention with volumes up to a l000 cc requiring I/O caths therefore foley placed, ABLA- Hgb down to 7.7, hyponatremia and labile blood sugars.  Therapy evaluations completed and CIR recommended due  to functional decline.    Patient transferred to CIR on 10/25/2018 .    Patient currently requires min with basic self-care skills secondary to muscle weakness, decreased cardiorespiratoy endurance and decreased standing balance and decreased balance strategies.  Prior to hospitalization, patient could complete BADLs with modified independent .  Patient will benefit from skilled intervention to increase independence with basic self-care skills prior to discharge home with care partner.  Anticipate patient will require intermittent supervision and follow up home health.  OT - End of Session Endurance Deficit: Yes Endurance Deficit Description: Pain prohibited patient from participating. OT Assessment Rehab Potential (ACUTE ONLY): Good OT Patient demonstrates impairments in the following area(s): Balance;Endurance;Pain OT Basic ADL's Functional Problem(s): Bathing;Dressing;Toileting OT Transfers Functional Problem(s): Toilet;Tub/Shower OT Additional Impairment(s): None OT Plan OT Intensity: Minimum of 1-2 x/day, 45 to 90 minutes OT Frequency: 5 out of 7 days OT Duration/Estimated Length of Stay: 7-10 days OT Treatment/Interventions: Balance/vestibular training;Discharge planning;Disease Lawyer;Functional mobility training;Pain management;Patient/family education;Psychosocial support;Self Care/advanced ADL retraining;Skin care/wound managment;UE/LE Strength taining/ROM;Therapeutic Exercise;Therapeutic Activities OT Self Feeding Anticipated Outcome(s): no goal, pt is Independent OT Basic Self-Care Anticipated Outcome(s): mod I OT Toileting Anticipated Outcome(s): Mod I OT Bathroom Transfers Anticipated Outcome(s): Mod I OT Recommendation Patient destination: Home Follow Up Recommendations: Home health OT Equipment Recommended: To be determined   Skilled Therapeutic Intervention Pt received in bed and immediately said, " I am in so much pain,  dont ask me to do anything!"  Engaged pt in conversation about his PMH, PLOF, goals for rehab and OT POC.  Pt easily engaged in discussion with no cognitive challenges noted.    Pt's L limb was unwrapped, so replaced clean gauze and kerlex, ACE wrap and limb sock.  This writer was extremely gentle barely touching his limb but pt yelled out in pain saying any and all touch is excruciating. He said there was no way he could handle doing any bed mobility or sitting right now. Pt refused further therapy at this time. Pt in bed with all needs met, alarm on and RN aware of pt's pain.  OT Evaluation Precautions/Restrictions  Precautions Precautions: Fall Restrictions Weight Bearing Restrictions: Yes LLE Weight Bearing: Non weight bearing General OT Amount of Missed Time: 20 Minutes Vital Signs Therapy Vitals Temp: 98.2 F (36.8 C) Temp Source: Oral Pulse Rate: (!) 109 Resp: 20 BP: (!) 150/90 Patient Position (if appropriate): Lying Oxygen Therapy SpO2: 97 % O2 Device: Room Air Pain Pain Assessment Pain Scale: 0-10 Pain Score: 10-Worst pain ever Pain Type: Acute pain;Surgical pain Pain Location: Leg Pain Orientation: Left Pain Descriptors / Indicators: Throbbing Pain Frequency: Intermittent Pain Onset: On-going Patients Stated Pain Goal: 5 Pain Intervention(s): Rest Home Living/Prior Functioning Home Living Family/patient expects to be discharged to:: Private residence Living Arrangements: Children Available Help at Discharge: Family Type of Home: House Home Access: Stairs to enter Technical brewer of Steps: 1 Home Layout: One level Bathroom Shower/Tub: Tub/shower unit, Curtain(has grab bars, hand held shower and tub bench) Bathroom Toilet: Standard(pt has toilet riser) Bathroom Accessibility: Yes  Lives With: Family Prior Function Level of Independence: Independent with basic ADLs, Independent with transfers(used w/c primarily)  Able to Take Stairs?:  Yes Driving:  Yes Vocation: Retired ADL ADL ADL Comments: Pt refused all mobility and self care due to 10/10 pain. Vision Baseline Vision/History: Wears glasses Wears Glasses: Reading only Patient Visual Report: No change from baseline Vision Assessment?: No apparent visual deficits Perception  Perception: Within Functional Limits Praxis Praxis: Intact Cognition Overall Cognitive Status: Within Functional Limits for tasks assessed Arousal/Alertness: Awake/alert Orientation Level: Person;Place;Situation Person: Oriented Place: Oriented Situation: Oriented Year: 2020 Month: June Day of Week: Correct Memory: Appears intact Immediate Memory Recall: Sock;Blue;Bed Memory Recall: Sock;Blue;Bed Memory Recall Sock: Without Cue Memory Recall Blue: Without Cue Memory Recall Bed: Without Cue Awareness: Appears intact Sensation Sensation Light Touch: Appears Intact(occasional tingling in R foot) Hot/Cold: Appears Intact Stereognosis: Appears Intact Motor  Appears WFL Mobility    Not evaluated due to 10/10 pain and pt refusal Trunk/Postural Assessment    Not evaluated due to 10/10 pain and pt refusal Balance   Not evaluated due to 10/10 pain and pt refusal Extremity/Trunk Assessment RUE Assessment RUE Assessment: Within Functional Limits LUE Assessment LUE Assessment: Within Functional Limits     Refer to Care Plan for Long Term Goals  Recommendations for other services: None    Discharge Criteria: Patient will be discharged from OT if patient refuses treatment 3 consecutive times without medical reason, if treatment goals not met, if there is a change in medical status, if patient makes no progress towards goals or if patient is discharged from hospital.  The above assessment, treatment plan, treatment alternatives and goals were discussed and mutually agreed upon: by patient  [facility] 10/26/2018, 9:29 AM

## 2018-10-26 NOTE — Care Management Note (Signed)
[facility] Individual Statement of Services  Patient Name:  Daniel Campos  Date:  10/26/2018  Welcome to the [facility].  Our goal is to provide you with an individualized program based on your diagnosis and situation, designed to meet your specific needs.  With this comprehensive rehabilitation program, you will be expected to participate in at least 3 hours of rehabilitation therapies Monday-Friday, with modified therapy programming on the weekends.  Your rehabilitation program will include the following services:  Physical Therapy (PT), Occupational Therapy (OT), 24 hour per day rehabilitation nursing, Neuropsychology, Case Management (Social Worker), Rehabilitation Medicine, Nutrition Services and Pharmacy Services  Weekly team conferences will be held on Wednesday to discuss your progress.  Your Social Worker will talk with you frequently to get your input and to update you on team discussions.  Team conferences with you and your family in attendance may also be held.  Expected length of stay: 7-10 days  Overall anticipated outcome: supervision to independent  Depending on your progress and recovery, your program may change. Your Social Worker will coordinate services and will keep you informed of any changes. Your Social Worker's name and contact numbers are listed  below.  The following services may also be recommended but are not provided by the [facility] will be made to provide these services after discharge if needed.  Arrangements include referral to agencies that provide these services.  Your insurance has been verified to be:  Medicare part [place] primary doctor is:  [place]  Pertinent information will be shared with your doctor and your insurance company.  Social Worker:  Ovidio Kin, [place] or ([phone]  Information discussed with and copy given to patient by: Elease Hashimoto, 10/26/2018, 10:57 AM

## 2018-10-26 NOTE — Significant Event (Signed)
Hypoglycemic Event  CBG: 53  Treatment: 4 oz juice/soda  Symptoms: None  Follow-up CBG: Time:1238 CBG Result:68  Possible Reasons for Event: Unknown  Comments/MD notified:pt given juice and crackers    Lonn Georgia M Shawnn Bouillon

## 2018-10-26 NOTE — Evaluation (Signed)
Speech Language Pathology Assessment and Plan  Patient Details  Name: Daniel Campos MRN: [id number] Date of Birth: 1956/04/07  SLP Diagnosis: N/A Rehab Potential: N/A ELOS: N/A   Today's Date: 10/26/2018 SLP Individual Time: 7048-8891 SLP Individual Time Calculation (min): 55 min   Problem List:  Patient Active Problem List   Diagnosis Date Noted  . Status post above-knee amputation ([place]) 10/25/2018  . Unilateral AKA, left ([place]) 10/25/2018  . Gangrene of left foot ([place]) 10/21/2018  . Melena   . Heme positive stool 02/23/2018  . Bacteremia due to Gram-positive bacteria 04/29/2017  . History of pulmonary embolus (PE) 04/29/2017  . Acute urinary retention 04/27/2017  . ARF (acute renal failure) ([place]) 04/26/2017  . Tachycardia 04/26/2017  . Acute anemia 04/26/2017  . Hyponatremia 04/26/2017  . Loose stools 08/31/2016  . Encounter for screening colonoscopy 04/29/2016  . Rotator cuff tear 02/24/2013  . Stiffness of joint, not elsewhere classified, other specified site 01/16/2013  . Weakness of right leg 01/16/2013  . Fracture of right shoulder 01/04/2013  . Osteopenia 01/02/2013  . Intertrochanteric fracture of right hip ([place]) 12/20/2012  . Shoulder fracture 12/20/2012  . Essential hypertension, benign 10/16/2012  . Diabetes mellitus with peripheral vascular disease ([place]) 08/16/2012  . Prostate hypertrophy 08/16/2012   Past Medical History:  Past Medical History:  Diagnosis Date  . Diabetes mellitus without complication ([place])   . Hypertension   . Prostate hypertrophy   . Pulmonary emboli ([place]) 2018   Past Surgical History:  Past Surgical History:  Procedure Laterality Date  . AMPUTATION Left 10/23/2018   Procedure: LEFT AMPUTATION ABOVE KNEE;  Surgeon: Rosetta Posner, MD;  Location: [facility];  Service: Vascular;  Laterality: Left;  . BIOPSY  04/06/2018   Procedure: BIOPSY;  Surgeon: Daneil Dolin, MD;  [facility];  Service: Endoscopy;;  (gastric)  .  COLONOSCOPY WITH PROPOFOL N/A 04/06/2018   Procedure: COLONOSCOPY WITH PROPOFOL;  Surgeon: Daneil Dolin, MD;  [facility];  Service: Endoscopy;  Laterality: N/A;  . ESOPHAGOGASTRODUODENOSCOPY (EGD) WITH PROPOFOL N/A 04/06/2018   Procedure: ESOPHAGOGASTRODUODENOSCOPY (EGD) WITH PROPOFOL;  Surgeon: Daneil Dolin, MD;  [facility];  Service: Endoscopy;  Laterality: N/A;  . INTRAMEDULLARY (IM) NAIL INTERTROCHANTERIC Right 12/06/2012   Procedure: INTRAMEDULLARY (IM) NAIL INTERTROCHANTRIC;  Surgeon: Carole Civil, MD;  Location: [facility];  Service: Orthopedics;  Laterality: Right;  after lunch   . KNEE SURGERY    . POLYPECTOMY  04/06/2018   Procedure: POLYPECTOMY;  Surgeon: Daneil Dolin, MD;  [facility];  Service: Endoscopy;;  colon  . SHOULDER SURGERY      Assessment / Plan / Recommendation Clinical Impression Patient is a 63 year old male with history of T2DM, PE, HTN who was admitted on 10/21/18 with 3 week history of non-healing LLE ulcers with calf pain that progressed to loss of sensation from knees down and gangrenous changes of foot and toes. He was noted to be anemic with Hgb-6 and had leucocytosis with WBC-18. He was started on broad spectrum antibiotics and transfused with 2 units PRBC. He was evaluated by Dr. Donzetta Matters who recommended L-AKA due to profound ischemia LLE. WOC consulted for full thickness wound RLE felt to be due to trauma and recommended local care with santyl and daily dressing changes. He underwent L-AKA on 06/08 by Dr. Donnetta Hutching and stump sock ordered by [facility]. He has had issue with urinary retention with volumes up to a l000 cc  requiring I/O caths therefore foley placed, ABLA- Hgb down to 7.7, hyponatremia and labile blood sugars. Therapy evaluations completed and CIR recommended due to functional decline and patient admitted 10/25/18.  Patient's overall cognitive functioning appears [facility] for all tasks assessed. Patient was  administered the MoCA-BASIC and scored 25/30 points with a score of 26 or above considered normal. Suspect mild short-term memory deficits that were observed were impacted by severe pain and distractibility. Suspect patient is at his cognitive baseline, therefore, skilled SLP intervention is not warranted at this time. Educated patient in regards to recommendations, he verbalized understanding and agreement.    Skilled Therapeutic Interventions          Administered a cognitive-linguistic evaluation, please see above for details.   SLP Assessment  N/A   Recommendations  N/A   SLP Frequency N/A  SLP Duration  SLP Intensity  SLP Treatment/Interventions N/A  N/A  N/A   Pain 10/10 pain in residual limb, patient repositioned and RN administered medications   Short Term Goals: N/A  Refer to Care Plan for Long Term Goals  Recommendations for other services: Neuropsych  Discharge Criteria: Patient will be discharged from SLP if patient refuses treatment 3 consecutive times without medical reason, if treatment goals not met, if there is a change in medical status, if patient makes no progress towards goals or if patient is discharged from hospital.  The above assessment, treatment plan, treatment alternatives and goals were discussed and mutually agreed upon: by patient  [name] 10/26/2018, 6:25 AM

## 2018-10-26 NOTE — Progress Notes (Signed)
FC removed at 1637 without difficulty. Pt tolerated well. 350 mL urine emptied from drainage bag. Pt to be scanned approx 2030. Will report to night shift nurse. No void noted at present. Will cont to monitor.   Erie Noe, RN

## 2018-10-26 NOTE — Progress Notes (Signed)
Patient information reviewed and entered into eRehab System by Becky Jamell Opfer, PPS coordinator. Information including medical coding, function ability, and quality indicators will be reviewed and updated through discharge.  During the Covid 19 public health emergency, the inpatient rehabilitation unit of the [facility] will use acute care beds on another unit to provide each patient with a private room. This effort is to assure proper infection control and to optimize care management during this public health emergency.     

## 2018-10-26 NOTE — Plan of Care (Signed)
  Problem: RH BLADDER ELIMINATION Goal: RH STG MANAGE BLADDER WITH ASSISTANCE Description: STG Manage Bladder With min Assistance Outcome: Progressing Goal: RH STG MANAGE BLADDER WITH EQUIPMENT WITH ASSISTANCE Description: STG Manage Bladder With Equipment With min Assistance Outcome: Progressing   Problem: RH SKIN INTEGRITY Goal: RH STG SKIN FREE OF INFECTION/BREAKDOWN Description: Pt will remain free from skin breakdown or infection during stay with min assist Outcome: Progressing Goal: RH STG MAINTAIN SKIN INTEGRITY WITH ASSISTANCE Description: STG Maintain Skin Integrity With min Assistance. Outcome: Progressing Goal: RH STG ABLE TO PERFORM INCISION/WOUND CARE W/ASSISTANCE Description: STG Able To Perform Incision/Wound Care With mod Assistance. Outcome: Progressing   Problem: RH SAFETY Goal: RH STG ADHERE TO SAFETY PRECAUTIONS W/ASSISTANCE/DEVICE Description: STG Adhere to Safety Precautions With min Assistance/Device. Outcome: Progressing   Problem: RH PAIN MANAGEMENT Goal: RH STG PAIN MANAGED AT OR BELOW PT'S PAIN GOAL Description: Less than 4 on 0-10 scale  Outcome: Progressing   Problem: Consults Goal: RH LIMB LOSS PATIENT EDUCATION Description: Description: See Patient Education module for eduction specifics. Outcome: Progressing   Problem: RH KNOWLEDGE DEFICIT LIMB LOSS Goal: RH STG INCREASE KNOWLEDGE OF SELF CARE AFTER LIMB LOSS Description: Pt will demonstrate techniques to prevent limb loss injury and decrease pain sensation  Outcome: Progressing   Problem: Consults Goal: Skin Care Protocol Initiated - if Braden Score 18 or less Description: If consults are not indicated, leave blank or document N/A Outcome: Progressing Goal: Nutrition Consult-if indicated Outcome: Progressing   Problem: RH BOWEL ELIMINATION Goal: RH STG MANAGE BOWEL WITH ASSISTANCE Description: STG Manage Bowel with min Assistance. Outcome: Not Progressing Goal: RH STG MANAGE BOWEL  W/MEDICATION W/ASSISTANCE Description: STG Manage Bowel with Medication with supervision Assistance. Outcome: Not Progressing

## 2018-10-26 NOTE — Evaluation (Signed)
Physical Therapy Assessment and Plan  Patient Details  Name: Daniel Campos MRN: [id number] Date of Birth: [birth year]  PT Diagnosis: Abnormality of gait, Difficulty walking and Pain in L leg Rehab Potential: Good ELOS: 7-10 days   Today's Date: 10/26/2018 PT Individual Time: 7829-5621 PT Individual Time Calculation (min): 69 min    Problem List:  Patient Active Problem List   Diagnosis Date Noted  . Status post above-knee amputation ([place]) 10/25/2018  . Unilateral AKA, left ([place]) 10/25/2018  . Gangrene of left foot ([place]) 10/21/2018  . Melena   . Heme positive stool 02/23/2018  . Bacteremia due to Gram-positive bacteria 04/29/2017  . History of pulmonary embolus (PE) 04/29/2017  . Acute urinary retention 04/27/2017  . ARF (acute renal failure) ([place]) 04/26/2017  . Tachycardia 04/26/2017  . Acute anemia 04/26/2017  . Hyponatremia 04/26/2017  . Loose stools 08/31/2016  . Encounter for screening colonoscopy 04/29/2016  . Rotator cuff tear 02/24/2013  . Stiffness of joint, not elsewhere classified, other specified site 01/16/2013  . Weakness of right leg 01/16/2013  . Fracture of right shoulder 01/04/2013  . Osteopenia 01/02/2013  . Intertrochanteric fracture of right hip ([place]) 12/20/2012  . Shoulder fracture 12/20/2012  . Essential hypertension, benign 10/16/2012  . Diabetes mellitus with peripheral vascular disease ([place]) 08/16/2012  . Prostate hypertrophy 08/16/2012    Past Medical History:  Past Medical History:  Diagnosis Date  . Diabetes mellitus without complication ([place])   . Hypertension   . Prostate hypertrophy   . Pulmonary emboli ([place]) 2018   Past Surgical History:  Past Surgical History:  Procedure Laterality Date  . AMPUTATION Left 10/23/2018   Procedure: LEFT AMPUTATION ABOVE KNEE;  Surgeon: Rosetta Posner, MD;  Location: [facility];  Service: Vascular;  Laterality: Left;  . BIOPSY  04/06/2018   Procedure: BIOPSY;  Surgeon: Daneil Dolin, MD;  [facility];  Service: Endoscopy;;  (gastric)  . COLONOSCOPY WITH PROPOFOL N/A 04/06/2018   Procedure: COLONOSCOPY WITH PROPOFOL;  Surgeon: Daneil Dolin, MD;  [facility];  Service: Endoscopy;  Laterality: N/A;  . ESOPHAGOGASTRODUODENOSCOPY (EGD) WITH PROPOFOL N/A 04/06/2018   Procedure: ESOPHAGOGASTRODUODENOSCOPY (EGD) WITH PROPOFOL;  Surgeon: Daneil Dolin, MD;  [facility];  Service: Endoscopy;  Laterality: N/A;  . INTRAMEDULLARY (IM) NAIL INTERTROCHANTERIC Right 12/06/2012   Procedure: INTRAMEDULLARY (IM) NAIL INTERTROCHANTRIC;  Surgeon: Carole Civil, MD;  [facility];  Service: Orthopedics;  Laterality: Right;  after lunch   . KNEE SURGERY    . POLYPECTOMY  04/06/2018   Procedure: POLYPECTOMY;  Surgeon: Daneil Dolin, MD;  [facility];  Service: Endoscopy;;  colon  . SHOULDER SURGERY      Assessment & Plan Clinical Impression: Daniel Campos is a 63 year old male with history of T2DM, PE, HTN who was admitted on 10/21/18 with 3 week history of non-healing LLE ulcers with calf pain that progressed to loss of sensation from knees down and gangrenous changes of foot and toes. He was noted to be anemic with Hgb-6 and had leucocytosis with WBC-18. He was started on broad spectrum antibiotics and transfused with 2 units PRBC. He was evaluated by Dr. Donzetta Matters who recommended L-AKA due to profound ischemia LLE. WOC consulted for full thickness wound RLE felt to be due to trauma and recommended local care with santyl and daily dressing changes. He underwent L-AKA on 06/08 by Dr. Donnetta Hutching and stump sock ordered by [facility]. He has had  issue with urinary retention with volumes up to a l000 cc requiring I/O caths therefore foley placed, ABLA- Hgb down to 7.7, hyponatremia and labile blood sugars. Therapy evaluations completed and CIR recommended due to functional decline.    Patient transferred to CIR on 10/25/2018 .    Patient currently requires min with  mobility secondary to muscle weakness and pain in L leg.  Prior to hospitalization, patient was modified independent  with mobility and lived with Son, Family, Daughter in a House home.  Home access is 1Stairs to enter.  Patient will benefit from skilled PT intervention to maximize safe functional mobility, minimize fall risk and decrease caregiver burden for planned discharge home with intermittent assist.  Anticipate patient will benefit from follow up [facility] at discharge.  PT - End of Session Activity Tolerance: Improving;Decreased this session;Tolerates 10 - 20 min activity with multiple rests;Tolerates 30+ min activity with multiple rests Endurance Deficit: Yes Endurance Deficit Description: Patient fatigues with activity and with significant pain. PT Assessment Rehab Potential (ACUTE/IP ONLY): Good PT Barriers to Discharge: Wound Care PT Patient demonstrates impairments in the following area(s): Balance;Pain;Safety;Endurance PT Transfers Functional Problem(s): Bed Mobility;Bed to Chair;Car;Furniture PT Locomotion Functional Problem(s): Wheelchair Mobility;Ambulation PT Plan PT Intensity: Minimum of 1-2 x/day ,45 to 90 minutes PT Frequency: 5 out of 7 days PT Duration Estimated Length of Stay: 7-10 days PT Treatment/Interventions: Ambulation/gait training;DME/adaptive equipment instruction;Functional mobility training;Therapeutic Activities;UE/LE Strength taining/ROM;Wheelchair propulsion/positioning;Therapeutic Exercise;Patient/family education;Neuromuscular re-education;Balance/vestibular training PT Transfers Anticipated Outcome(s): mod I PT Locomotion Anticipated Outcome(s): mod I w/c level PT Recommendation Follow Up Recommendations: Home health PT Patient destination: Home Equipment Details: has w/c at home, not sure about walker  Skilled Therapeutic Intervention Patient in supine and agreeable to PT.  Educated on evaluation procedures and performed in bed extremity assessment.   Supine to sit with CGA and cues and increased time.  Patient seated EOB without balance issues noted for static balance.  Scoot/pivot bed to w/c to R side with min/CGA.  In w/c assisted for parts and pt propelled x 200' with S in hallway.  Sit to stand to walker in hallway with min a and increased time.  Able to stand about 45 sec to 1 minutes with cues for hip extension and pt reporting feeling light headed.  Patient with HR up to 104 with activity.  Propelled another 200' with S.  Scoot-pivot to R to bed with CGA.  Patient sit to supine min a for R LE and increased time for positioning.  Educated on plan for stay and ELOS.  Left in supine with call bell in reach and bed alarm activated.   PT Evaluation Precautions/Restrictions Precautions Precautions: Fall Restrictions Weight Bearing Restrictions: Yes LLE Weight Bearing: Non weight bearing General   Vital SignsTherapy Vitals Temp: 98.4 F (36.9 C) Temp Source: Oral Pulse Rate: (!) 103 Resp: 19 BP: 133/85 Patient Position (if appropriate): Sitting Oxygen Therapy SpO2: 100 % O2 Device: Room Air Patient Activity (if Appropriate): In chair Pain Pain Assessment Pain Scale: 0-10 Pain Score: 8  Pain Type: Acute pain Pain Location: Leg Pain Orientation: Left Pain Descriptors / Indicators: Aching;Discomfort Pain Frequency: Constant Pain Onset: On-going Patients Stated Pain Goal: 4 Pain Intervention(s): Medication (See eMAR) Home Living/Prior Functioning Home Living Available Help at Discharge: Family Type of Home: House Home Access: Stairs to enter Technical brewer of Steps: 1 Home Layout: One level Bathroom Shower/Tub: Product/process development scientist: Standard Bathroom Accessibility: Yes  Lives With: Son;Family;Daughter Prior Function Level of Independence: Independent with  basic ADLs;Independent with transfers Driving: Yes 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 Comments: couple of  months since he was walking Vision/Perception  Geologist, engineering: Within Advertising copywriter Praxis Praxis: Intact  Cognition Overall Cognitive Status: Within Functional Limits for tasks assessed Arousal/Alertness: Awake/alert Orientation Level: Oriented X4 Memory: Appears intact Problem Solving: Appears intact Safety/Judgment: Appears intact Sensation Sensation Light Touch: Appears Intact Hot/Cold: Not tested Proprioception: Not tested Stereognosis: Not tested Coordination Gross Motor Movements are Fluid and Coordinated: No Fine Motor Movements are Fluid and Coordinated: Yes Coordination and Movement Description: limited L leg due to pain Motor  Motor Motor: Within Functional Limits Motor - Skilled Clinical Observations: generalized weakness  Mobility Bed Mobility Bed Mobility: Supine to Sit;Sit to Supine Supine to Sit: Minimal Assistance - Patient > 75% Sit to Supine: Minimal Assistance - Patient > 75% Transfers Transfers: Sit to Stand;Lateral/Scoot Transfers Sit to Stand: Minimal Assistance - Patient > 75% Lateral/Scoot Transfers: Contact Guard/Touching assist Transfer (Assistive device): Rolling walker Locomotion  Gait Ambulation: No Gait Gait: No Stairs / Additional Locomotion Stairs: No Wheelchair Mobility Wheelchair Mobility: Yes Wheelchair Assistance: Chartered loss adjuster: Both upper extremities Wheelchair Parts Management: Needs assistance Distance: 200'  Trunk/Postural Assessment  Cervical Assessment Cervical Assessment: Exceptions to WFL(forward head) Thoracic Assessment Thoracic Assessment: Within Functional Limits Lumbar Assessment Lumbar Assessment: Within Functional Limits Postural Control Postural Control: Deficits on evaluation Righting Reactions: slowed  Balance Balance Balance Assessed: Yes Static Sitting Balance Static Sitting - Balance Support: No upper extremity supported Static Sitting - Level of  Assistance: 6: Modified independent (Device/Increase time) Dynamic Sitting Balance Dynamic Sitting - Balance Support: Left upper extremity supported;During functional activity Dynamic Sitting - Level of Assistance: 4: Min assist Dynamic Sitting - Balance Activities: Forward lean/weight shifting;Lateral lean/weight shifting Static Standing Balance Static Standing - Balance Support: Bilateral upper extremity supported Static Standing - Level of Assistance: 4: Min assist Static Standing - Comment/# of Minutes: standing about 45 seconds Extremity Assessment  RUE Assessment RUE Assessment: Within Functional Limits LUE Assessment LUE Assessment: Within Functional Limits RLE Assessment Active Range of Motion (AROM) Comments: limited knee flexion in supine with large scar over knee, but functional AROM for sit<>stand [facility] General Strength Comments: hip flexion 4/5, knee extension 4/5, flexion 3+/5, ankle DF 4+/5 LLE Assessment LLE Assessment: Exceptions to [facility] Active Range of Motion (AROM) Comments: limited hip extension in supine due to pain General Strength Comments: lifts in supine antigravity, but no resistance testing due to pain    Refer to Care Plan for Long Term Goals  Recommendations for other services: None   Discharge Criteria: Patient will be discharged from PT if patient refuses treatment 3 consecutive times without medical reason, if treatment goals not met, if there is a change in medical status, if patient makes no progress towards goals or if patient is discharged from hospital.  The above assessment, treatment plan, treatment alternatives and goals were discussed and mutually agreed upon: by patient  Jamison Oka, PT 10/26/2018, 5:21 PM

## 2018-10-27 ENCOUNTER — Inpatient Hospital Stay (HOSPITAL_COMMUNITY): Payer: Non-veteran care | Admitting: Occupational Therapy

## 2018-10-27 ENCOUNTER — Inpatient Hospital Stay (HOSPITAL_COMMUNITY): Payer: Non-veteran care | Admitting: Physical Therapy

## 2018-10-27 DIAGNOSIS — I739 Peripheral vascular disease, unspecified: Secondary | ICD-10-CM

## 2018-10-27 LAB — GLUCOSE, CAPILLARY
Glucose-Capillary: 227 mg/dL — ABNORMAL HIGH (ref 70–99)
Glucose-Capillary: 350 mg/dL — ABNORMAL HIGH (ref 70–99)
Glucose-Capillary: 226 mg/dL — ABNORMAL HIGH (ref 70–99)
Glucose-Capillary: 73 mg/dL (ref 70–99)

## 2018-10-27 LAB — URINALYSIS, ROUTINE W REFLEX MICROSCOPIC
Bilirubin Urine: NEGATIVE
Glucose, UA: NEGATIVE mg/dL
Hgb urine dipstick: NEGATIVE
Ketones, ur: 5 mg/dL — AB
Nitrite: NEGATIVE
Protein, ur: NEGATIVE mg/dL
Specific Gravity, Urine: 1.015 (ref 1.005–1.030)
pH: 5 (ref 5.0–8.0)

## 2018-10-27 LAB — OCCULT BLOOD X 1 CARD TO LAB, STOOL: Fecal Occult Bld: NEGATIVE

## 2018-10-27 LAB — MAGNESIUM: Magnesium: 1.6 mg/dL — ABNORMAL LOW (ref 1.7–2.4)

## 2018-10-27 MED ORDER — POLYETHYLENE GLYCOL 3350 17 G PO PACK
17.0000 g | PACK | Freq: Two times a day (BID) | ORAL | Status: DC
  Administered 2018-10-28 – 2018-11-01 (×8): 17 g via ORAL
  Filled 2018-10-27 (×9): qty 1

## 2018-10-27 MED ORDER — SENNOSIDES-DOCUSATE SODIUM 8.6-50 MG PO TABS
2.0000 | ORAL_TABLET | Freq: Every day | ORAL | Status: DC
  Administered 2018-10-28 – 2018-10-31 (×3): 2 via ORAL
  Filled 2018-10-27 (×4): qty 2

## 2018-10-27 MED ORDER — MAGNESIUM OXIDE 400 (241.3 MG) MG PO TABS
400.0000 mg | ORAL_TABLET | Freq: Two times a day (BID) | ORAL | Status: DC
  Administered 2018-10-27 – 2018-11-01 (×10): 400 mg via ORAL
  Filled 2018-10-27 (×10): qty 1

## 2018-10-27 NOTE — Progress Notes (Signed)
Occupational Therapy Session Note  Patient Details  Name: Daniel Campos MRN: [id number] Date of Birth: [birth year]  Today's Date: 10/27/2018 OT Individual Time: 0476-7378 OT Individual Time Calculation (min): 45 min    Short Term Goals: Week 1:  OT Short Term Goal 1 (Week 1): Pt will be able to complete toilet transfers with S. OT Short Term Goal 2 (Week 1): Pt will complete toileting with S. OT Short Term Goal 3 (Week 1): Pt will complete LB dressing with S.  Skilled Therapeutic Interventions/Progress Updates:    Pt had just gotten back to bed.  NT reported pt had been sitting for a long time and pt stated his pain had increased quite a bit and he could not tolerate sitting up or moving around. He was agreeable to working on UE strengthening theraband exercises from bed level.  Pt used orange level 2 band for a variety of shoulder, tricep, and bicep strengthening.  PNF D1, D2 patterns with band.  Pt participated well.  Resting in bed with all needs met and alarm on.   Therapy Documentation Precautions:  Precautions Precautions: Fall Restrictions Weight Bearing Restrictions: Yes LLE Weight Bearing: Non weight bearing  Pain: Pain Assessment Pain Scale: 0-10 Pain Score: 10-Worst pain ever Pain Type: Acute pain Pain Location: Leg Pain Orientation: Left Pain Descriptors / Indicators: Aching Pain Onset: Progressive Patients Stated Pain Goal: 2 Pain Intervention(s): Rest ADL: ADL ADL Comments: Pt refused all mobility and self care due to 10/10 pain.   Therapy/Group: Individual Therapy  [place] 10/27/2018, 10:50 AM

## 2018-10-27 NOTE — Plan of Care (Signed)
  Problem: RH BOWEL ELIMINATION Goal: RH STG MANAGE BOWEL WITH ASSISTANCE Description: STG Manage Bowel with min Assistance. Outcome: Progressing Goal: RH STG MANAGE BOWEL W/MEDICATION W/ASSISTANCE Description: STG Manage Bowel with Medication with supervision Assistance. Outcome: Progressing   Problem: RH BLADDER ELIMINATION Goal: RH STG MANAGE BLADDER WITH ASSISTANCE Description: STG Manage Bladder With min Assistance Outcome: Progressing Goal: RH STG MANAGE BLADDER WITH EQUIPMENT WITH ASSISTANCE Description: STG Manage Bladder With Equipment With min Assistance Outcome: Progressing   Problem: RH SKIN INTEGRITY Goal: RH STG SKIN FREE OF INFECTION/BREAKDOWN Description: Pt will remain free from skin breakdown or infection during stay with min assist Outcome: Progressing Goal: RH STG MAINTAIN SKIN INTEGRITY WITH ASSISTANCE Description: STG Maintain Skin Integrity With min Assistance. Outcome: Progressing   Problem: RH SAFETY Goal: RH STG ADHERE TO SAFETY PRECAUTIONS W/ASSISTANCE/DEVICE Description: STG Adhere to Safety Precautions With min Assistance/Device. Outcome: Progressing   Problem: RH PAIN MANAGEMENT Goal: RH STG PAIN MANAGED AT OR BELOW PT'S PAIN GOAL Description: Less than 4 on 0-10 scale  Outcome: Progressing   Problem: Consults Goal: RH LIMB LOSS PATIENT EDUCATION Description: Description: See Patient Education module for eduction specifics. Outcome: Progressing   Problem: RH KNOWLEDGE DEFICIT LIMB LOSS Goal: RH STG INCREASE KNOWLEDGE OF SELF CARE AFTER LIMB LOSS Description: Pt will demonstrate techniques to prevent limb loss injury and decrease pain sensation  Outcome: Progressing   Problem: Consults Goal: Skin Care Protocol Initiated - if Braden Score 18 or less Description: If consults are not indicated, leave blank or document N/A Outcome: Progressing Goal: Nutrition Consult-if indicated Outcome: Progressing Goal: Diabetes Guidelines if  Diabetic/Glucose > 140 Description: If diabetic or lab glucose is > 140 mg/dl - Initiate Diabetes/Hyperglycemia Guidelines & Document Interventions  Outcome: Progressing

## 2018-10-27 NOTE — Progress Notes (Signed)
Occupational Therapy Session Note  Patient Details  Name: Daniel Campos MRN: [id number] Date of Birth: 17-Nov-1955  Today's Date: 10/27/2018 OT Individual Time: 1350-1420 OT Individual Time Calculation (min): 30 min    Short Term Goals: Week 1:  OT Short Term Goal 1 (Week 1): Pt will be able to complete toilet transfers with S. OT Short Term Goal 2 (Week 1): Pt will complete toileting with S. OT Short Term Goal 3 (Week 1): Pt will complete LB dressing with S.  Skilled Therapeutic Interventions/Progress Updates:    Patient asleep in bed.  He requires increased time to waken for active participation in therapy session.  Supine to/from SSP with CS.  Sit to stand at edge of elevated bed CS for 3 trials - able to maintain stance for approx 2 minutes each attempt.  Unsupported sitting with CS.  He returned to supine position at close of session with bed alarm set and call bell in reach.    Therapy Documentation Precautions:  Precautions Precautions: Fall Restrictions Weight Bearing Restrictions: Yes LLE Weight Bearing: Non weight bearing General:   Vital Signs: Therapy Vitals Temp: (!) 97.3 F (36.3 C) Temp Source: Oral Pulse Rate: (!) 106 Resp: 18 BP: 132/82 Patient Position (if appropriate): Lying Oxygen Therapy SpO2: 98 % O2 Device: Room Air Pain: Pain Assessment Pain Scale: 0-10 Pain Score: 2  Pain Type: Surgical pain;Phantom pain Pain Location: Leg Pain Orientation: Left Pain Descriptors / Indicators: Throbbing Pain Onset: Progressive Pain Intervention(s): Repositioned;Shower   Other Treatments:     Therapy/Group: Individual Therapy  [name] 10/27/2018, 2:41 PM

## 2018-10-27 NOTE — Progress Notes (Signed)
Occupational Therapy Session Note  Patient Details  Name: Daniel Campos MRN: [id number] Date of Birth: [birth year]  Today's Date: 10/27/2018 OT Individual Time: 0900-1000 OT Individual Time Calculation (min): 60 min    Short Term Goals: Week 1:  OT Short Term Goal 1 (Week 1): Pt will be able to complete toilet transfers with S. OT Short Term Goal 2 (Week 1): Pt will complete toileting with S. OT Short Term Goal 3 (Week 1): Pt will complete LB dressing with S.  Skilled Therapeutic Interventions/Progress Updates:    Patient seated in w/c, states that he is feeling better but is worn out due to gi issues overnight.  Sit pivot transfers to/from shower bench, w/c and toilet with min A, moderate cues for safe technique and w/c set up.  Right calf, iv site and residual limb covered - patient able to complete shower with min A for buttocks. UB dressing seated in w/c with set up, LB dressing mod A for pants over residual limb and hips, toileting mod A for clothing mgmt, max A for right slipper sock.  Patient remained in w/c at close of session with belt alarm set and call bell in reach.    Therapy Documentation Precautions:  Precautions Precautions: Fall Restrictions Weight Bearing Restrictions: Yes LLE Weight Bearing: Non weight bearing General:   Vital Signs:  Pain: Pain Assessment Pain Scale: 0-10 Pain Score: 5  Pain Type: Surgical pain;Phantom pain Pain Location: Leg Pain Orientation: Left Pain Descriptors / Indicators: Throbbing Pain Onset: Progressive Patients Stated Pain Goal: 2 Pain Intervention(s): Repositioned;Shower   Other Treatments:     Therapy/Group: Individual Therapy  [name] 10/27/2018, 11:34 AM

## 2018-10-27 NOTE — Progress Notes (Signed)
Calorie Count Note: Day 1  48-hour calorie count ordered and started 6/11 at lunch meal. Calorie count will end on 6/13 after breakfast meal.  RD will provide results of Day 2 of calorie count on Monday, 6/15.  Diet: Heart Healthy/Carb Modified Supplements: - Juven BID - Ensure Max BID  6/11   Lunch: 152 kcal, 16 grams of protein 6/11   Dinner: 274 kcal, 12 grams of protein 6/12   Breakfast: 136 kcal, 1 gram of protein Supplements: - 2 Juven and 2 Ensure Max per MAR (490 kcal, 65 grams of protein)  Total 24-hour intake: 1052 kcal (53% of minimum estimated needs)  94 grams of protein (90% of minimum estimated needs)  Nutrition Diagnosis: Increased nutrient needs related to wound healing, post-op healing as evidenced by estimated needs.  Goal: Patient will meet greater than or equal to 90% of their needs  Intervention:  - Continue 48-hour calorie count  - Continue Ensure Max po BID, each supplement provides 150 kcal and 30 grams of protein  - Continue 1 packet Juven BID, each packet provides 95 calories, 2.5 grams of protein (collagen), and 9.8 grams of carbohydrate (3 grams sugar); also contains 7 grams of L-arginine and L-glutamine, 300 mg vitamin C, 15 mg vitamin E, 1.2 mcg vitamin B-12, 9.5 mg zinc, 200 mg calcium, and 1.5 g  Calcium Beta-hydroxy-Beta-methylbutyrate to support wound healing  - Continue MVI with minerals daily   Gaynell Face, MS, RD, LDN Inpatient Clinical Dietitian Pager: [phone] Weekend/After Hours: [phone]

## 2018-10-27 NOTE — IPOC Note (Deleted)
Overall Plan of Care [facility]) Patient Details Name: Daniel Campos MRN: [id number] DOB: [birth year]  Admitting Diagnosis: Status post above-knee amputation [facility])  Hospital Problems: Principal Problem:   Status post above-knee amputation ([place]) Active Problems:   Diabetes mellitus with peripheral vascular disease ([place])   Essential hypertension, benign   History of pulmonary embolus (PE)   Unilateral AKA, left (HCC)     Functional Problem List: Nursing Bladder, Bowel, Medication Management, Motor, Pain, Skin Integrity  PT Balance, Pain, Safety, Endurance  OT Balance, Endurance, Pain  SLP    TR         Basic ADL's: OT Bathing, Dressing, Toileting     Advanced  ADL's: OT       Transfers: PT Bed Mobility, Bed to Chair, Car, Manufacturing systems engineer, Metallurgist: PT Emergency planning/management officer, Ambulation     Additional Impairments: OT None  SLP None      TR      Anticipated Outcomes Item Anticipated Outcome  Self Feeding no goal, pt is Independent  Swallowing      Basic self-care  mod I  Toileting  Mod I   Bathroom Transfers Mod I  Bowel/Bladder  pt to remain cont x 2  Transfers  mod I  Locomotion  mod I w/c level  Communication     Cognition     Pain  less than 3 on 0-10 scale  Safety/Judgment  pt will remain free from fallls   Therapy Plan: PT Intensity: Minimum of 1-2 x/day ,45 to 90 minutes PT Frequency: 5 out of 7 days PT Duration Estimated Length of Stay: 7-10 days OT Intensity: Minimum of 1-2 x/day, 45 to 90 minutes OT Frequency: 5 out of 7 days OT Duration/Estimated Length of Stay: 7-10 days     Due to the current state of emergency, patients may not be receiving their 3-hours of Medicare-mandated therapy.   Team Interventions: Nursing Interventions Bladder Management, Bowel Management, Pain Management, Skin Care/Wound Management, Medication Management, Patient/Family Education  PT interventions Ambulation/gait training,  DME/adaptive equipment instruction, Functional mobility training, Therapeutic Activities, UE/LE Strength taining/ROM, Wheelchair propulsion/positioning, Therapeutic Exercise, Patient/family education, Neuromuscular re-education, Training and development officer  OT Interventions Balance/vestibular training, Discharge planning, Disease mangement/prevention, DME/adaptive equipment instruction, Functional mobility training, Pain management, Patient/family education, Psychosocial support, Self Care/advanced ADL retraining, Skin care/wound managment, UE/LE Strength taining/ROM, Therapeutic Exercise, Therapeutic Activities  SLP Interventions    TR Interventions    SW/CM Interventions Discharge Planning, Psychosocial Support, Patient/Family Education   Barriers to Discharge MD  Medical stability  Nursing      PT Wound Care    OT      SLP      SW       Team Discharge Planning: Destination: PT-Home ,OT- Home , SLP-Home Projected Follow-up: PT-Home health PT, OT-  Home health OT, SLP-None Projected Equipment Needs: PT- , OT- To be determined, SLP-None recommended by SLP Equipment Details: PT-has w/c at home, not sure about walker, OT-  Patient/family involved in discharge planning: PT- Patient,  OT-Patient, SLP-Patient  MD ELOS: 7-10 days Medical Rehab Prognosis:  Excellent Assessment: .The patient has been admitted for CIR therapies with the diagnosis of left AKA. The team will be addressing functional mobility, strength, stamina, balance, safety, adaptive techniques and equipment, self-care, bowel and bladder mgt, patient and caregiver education,   Pre-prosthetic education, painmgt . Goals have been set at mod I at wc level.   Due to the current state of emergency, patients may  not be receiving their 3 hours per day of Medicare-mandated therapy.    Meredith Staggers, MD, FAAPMR      See Team Conference Notes for weekly updates to the plan of care

## 2018-10-27 NOTE — Progress Notes (Signed)
[place] PHYSICAL MEDICINE & REHABILITATION PROGRESS NOTE   Subjective/Complaints:  Lying in bed. States he's tired from having multiple bm's after "clean out" yesterday  ROS: Patient denies fever, rash, sore throat, blurred vision, nausea, vomiting, diarrhea, cough, shortness of breath or chest pain, headache, or mood change.   Objective:   No results found. Recent Labs    10/25/18 0136 10/26/18 0351  WBC 13.1* 14.1*  HGB 7.7* 8.6*  HCT 24.2* 27.0*  PLT 683* 747*   Recent Labs    10/25/18 0136 10/26/18 0351  NA 133* 133*  K 4.1 4.4  CL 106 104  CO2 19* 22  GLUCOSE 74 111*  BUN 17 12  CREATININE 0.79 0.77  CALCIUM 7.9* 8.1*    Intake/Output Summary (Last 24 hours) at 10/27/2018 1217 Last data filed at 10/27/2018 0900 Gross per 24 hour  Intake 650 ml  Output 900 ml  Net -250 ml     Physical Exam: Vital Signs Blood pressure (!) 144/86, pulse (!) 108, temperature 98.2 F (36.8 C), temperature source Oral, resp. rate 20, height 5\' 11"  (1.803 m), weight 96.1 kg, SpO2 97 %. Constitutional: No distress . Vital signs reviewed. HEENT: EOMI, oral membranes moist Neck: supple Cardiovascular: RRR without murmur. No JVD    Respiratory: CTA Bilaterally without wheezes or rales. Normal effort    GI: BS +, non-tender, non-distended  Genitourinary: Genitourinary Comments: Foley in place. Musculoskeletal:  Comments: Multiple healed scars right knee. Dry dressing right ankle. Neurological: He is alertand oriented to person, place, and time.   Skin: He isnot diaphoretic.left AKA incision clean, dressing in place Motor strength is 5/5 bilateral deltoid bicep tricep grip Right lower extremity has 4/5 strength in hip flexor knee extensor ankle dorsiflexor, Left leg with antigravity Sensation intact light touch right foot Psych: flat    Assessment/Plan: 1. Functional deficits secondary to left AKA which require 3+ hours per day of interdisciplinary therapy  in a comprehensive inpatient rehab setting.  Physiatrist is providing close team supervision and 24 hour management of active medical problems listed below.  Physiatrist and rehab team continue to assess barriers to discharge/monitor patient progress toward functional and medical goals  Care Tool:  Bathing  Bathing activity did not occur: Refused Body parts bathed by patient: Right arm, Left arm, Chest, Abdomen, Front perineal area, Right upper leg, Left upper leg, Right lower leg, Face   Body parts bathed by helper: Buttocks Body parts n/a: Left lower leg   Bathing assist Assist Level: Minimal Assistance - Patient > 75%     Upper Body Dressing/Undressing Upper body dressing Upper body dressing/undressing activity did not occur (including orthotics): Refused What is the patient wearing?: Pull over shirt    Upper body assist Assist Level: Set up assist    Lower Body Dressing/Undressing Lower body dressing    Lower body dressing activity did not occur: Refused What is the patient wearing?: Pants     Lower body assist Assist for lower body dressing: Moderate Assistance - Patient 50 - 74%     Toileting Toileting Toileting Activity did not occur Landscape architect and hygiene only): Refused  Toileting assist Assist for toileting: Moderate Assistance - Patient 50 - 74%     Transfers Chair/bed transfer  Transfers assist  Chair/bed transfer activity did not occur: Refused  Chair/bed transfer assist level: Contact Guard/Touching assist     Locomotion Ambulation   Ambulation assist   Ambulation activity did not occur: Safety/medical concerns  Walk 10 feet activity   Assist  Walk 10 feet activity did not occur: Safety/medical concerns        Walk 50 feet activity   Assist Walk 50 feet with 2 turns activity did not occur: Safety/medical concerns         Walk 150 feet activity   Assist Walk 150 feet activity did not occur: Safety/medical  concerns         Walk 10 feet on uneven surface  activity   Assist Walk 10 feet on uneven surfaces activity did not occur: Safety/medical concerns         Wheelchair     Assist Will patient use wheelchair at discharge?: Yes Type of Wheelchair: Manual    Wheelchair assist level: Supervision/Verbal cueing Max wheelchair distance: 200'    Wheelchair 50 feet with 2 turns activity    Assist        Assist Level: Supervision/Verbal cueing   Wheelchair 150 feet activity     Assist     Assist Level: Supervision/Verbal cueing     Medical Problem List and Plan: 1. 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 Antithrombotics: -DVT/anticoagulation:Pharmaceutical:Lovenox -antiplatelet therapy: ASA 81mg  daily 3. Pain Management:On Lyrica tid. Change dilaudid to oxycodone prn. 4. Mood:team to provide ego support as needed  -neuropsych eval -antipsychotic agents: NA 5. Neuropsych: This patientiscapable of making decisions on hisown behalf. 6. Skin/Wound Care:Pre-albumin 6.8--added proteins supplements for malnutrition and to promote wound healing.  -local care and ACE to stump 7. Fluids/Electrolytes/Nutrition:Monitor I/O--intake poor--lunch untouched.Check lytes in am to follow up on hyponatremia.  8. 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 Donnetta Hutching in 4 weeks for staple removal  9. Acute on chronic anemia/H/o GIB:   -most recent HGB up to 8.6 10: HTN: Monitor BP tid 11. T2DM with peripheral neuropathy: Hgb A1C-7.7.Lantus was decreased to 15 units today to avoid hypoglycemia.now sugars trending up over last 24hr  -further adjustment based on cbg patterns 12. Leucocytosis: Resolving from 18.6-->13.1, up to 14K on 6/11 afebrile. Monitor for signs of infection/fevers.  13. Urinary retention:foley discontinued.  UA  equivocal, UCX pending.  -continue bladder program/training with I/O cath if needed 14. Constipation:augmented  bowel program with results  -encouraged pt that things should settle down today    LOS: 2 days A FACE TO FACE EVALUATION WAS PERFORMED  Meredith Staggers 10/27/2018, 12:17 PM

## 2018-10-27 NOTE — IPOC Note (Signed)
Overall Plan of Care [facility]) Patient Details Name: Daniel Campos MRN: [id number] DOB: [birth year]  Admitting Diagnosis: Status post above-knee amputation [facility])  Hospital Problems: Principal Problem:   Status post above-knee amputation ([place]) Active Problems:   Diabetes mellitus with peripheral vascular disease ([place])   Essential hypertension, benign   History of pulmonary embolus (PE)   Unilateral AKA, left (HCC)     Functional Problem List: Nursing Bladder, Bowel, Medication Management, Motor, Pain, Skin Integrity  PT Balance, Pain, Safety, Endurance  OT Balance, Endurance, Pain  SLP    TR         Basic ADL's: OT Bathing, Dressing, Toileting     Advanced  ADL's: OT       Transfers: PT Bed Mobility, Bed to Chair, Car, Manufacturing systems engineer, Metallurgist: PT Emergency planning/management officer, Ambulation     Additional Impairments: OT None  SLP None      TR      Anticipated Outcomes Item Anticipated Outcome  Self Feeding no goal, pt is Independent  Swallowing      Basic self-care  mod I  Toileting  Mod I   Bathroom Transfers Mod I  Bowel/Bladder  pt to remain cont x 2  Transfers  mod I  Locomotion  mod I w/c level  Communication     Cognition     Pain  less than 3 on 0-10 scale  Safety/Judgment  pt will remain free from fallls   Therapy Plan: PT Intensity: Minimum of 1-2 x/day ,45 to 90 minutes PT Frequency: 5 out of 7 days PT Duration Estimated Length of Stay: 7-10 days OT Intensity: Minimum of 1-2 x/day, 45 to 90 minutes OT Frequency: 5 out of 7 days OT Duration/Estimated Length of Stay: 7-10 days     Due to the current state of emergency, patients may not be receiving their 3-hours of Medicare-mandated therapy.   Team Interventions: Nursing Interventions Bladder Management, Bowel Management, Pain Management, Skin Care/Wound Management, Medication Management, Patient/Family Education  PT interventions Ambulation/gait training,  DME/adaptive equipment instruction, Functional mobility training, Therapeutic Activities, UE/LE Strength taining/ROM, Wheelchair propulsion/positioning, Therapeutic Exercise, Patient/family education, Neuromuscular re-education, Training and development officer  OT Interventions Balance/vestibular training, Discharge planning, Disease mangement/prevention, DME/adaptive equipment instruction, Functional mobility training, Pain management, Patient/family education, Psychosocial support, Self Care/advanced ADL retraining, Skin care/wound managment, UE/LE Strength taining/ROM, Therapeutic Exercise, Therapeutic Activities  SLP Interventions    TR Interventions    SW/CM Interventions Discharge Planning, Psychosocial Support, Patient/Family Education   Barriers to Discharge MD  Medical stability and Wound care  Nursing      PT Wound Care    OT      SLP      SW       Team Discharge Planning: Destination: PT-Home ,OT- Home , SLP-Home Projected Follow-up: PT-Home health PT, OT-  Home health OT, SLP-None Projected Equipment Needs: PT- , OT- To be determined, SLP-None recommended by SLP Equipment Details: PT-has w/c at home, not sure about walker, OT-  Patient/family involved in discharge planning: PT- Patient,  OT-Patient, SLP-Patient  MD ELOS: 7d Medical Rehab Prognosis:  Excellent Assessment:   63 year old male with history of T2DM, PE, HTN who was admitted on 10/21/18 with 3 week history of non-healing LLE ulcers with calf pain that progressed to loss of sensation from knees down and gangrenous changes of foot and toes. He was noted to be anemic with Hgb-6 and had leucocytosis with WBC-18. He was started on broad spectrum  antibiotics and transfused with 2 units PRBC. He was evaluated by Dr. Donzetta Matters who recommended L-AKA due to profound ischemia LLE. WOC consulted for full thickness wound RLE felt to be due to trauma and recommended local care with santyl and daily dressing changes. He underwent L-AKA  on 06/08 by Dr. Donnetta Hutching and stump sock ordered by [facility]. He has had issue with urinary retention with volumes up to a l000 cc requiring I/O caths therefore foley placed, ABLA- Hgb down to 7.7, hyponatremia and labile blood sugars. Therapy evaluations completed and CIR recommended due to functional decline.    Now requiring 24/7 Rehab RN,MD, as well as CIR level PT, OT   Treatment team will focus on ADLs and mobility with goals set at [facility] See Team Conference Notes for weekly updates to the plan of care

## 2018-10-27 NOTE — Progress Notes (Signed)
Social Work Patient ID: Daniel Campos, male   DOB: Jan 02, 1956, 63 y.o.   MRN: [id number] Spoke with daughter who is working on a ramp for pt. Has [facility] and has had contractors come to the home. Discussed team conference Wed she wants an update. Will ask co-worker to contact her after team conference. Discussed home health follow up and possible DME. Will work on discharge needs.

## 2018-10-27 NOTE — Progress Notes (Signed)
Physical Therapy Session Note  Patient Details  Name: Daniel Campos MRN: [id number] Date of Birth: 1956/03/15  Today's Date: 10/27/2018 PT Individual Time: 7588-3254 PT Individual Time Calculation (min): 40 min   Short Term Goals: Week 1:  PT Short Term Goal 1 (Week 1): STG=LTG due to ELOS  Skilled Therapeutic Interventions/Progress Updates:  Pt seated in wheelchair on arrival to room. Agreeable to PT at this time. Does report limb pain, does not want pain medication until after PT session.  Pt able to propel himself from room to mat table in hallway with supervision. Scoot pivot transfer from wheelchair to/from mat table with min guard assist after PTA moved armrest out of way. On mat table pt able to lie supine with supervision, increased time and effort needed. In supine with left limb: straight leg raises, then hip abduction/adduction for 10 reps each AAROM. Pt able to roll onto right side with supervision, increased time and effort. In right sidelying with left limb: hip extension and hip abduction for 10 reps each with assistance. Pt able to bring himself back to supine and then sitting at edge of bed with increased time/effort, min guard assist. Once back in wheelchair pt was able to propel himself back to room. In room pt stood with min assist to RW. In standing with min guard to min assist for balance pt performed left limb extension and abduction for 10 reps each. Min assist for controlled descent to wheelchair.   Pt left in wheelchair with alarm belt on and all needs in reach.   Therapy Documentation Precautions:  Precautions Precautions: Fall Restrictions Weight Bearing Restrictions: Yes LLE Weight Bearing: Non weight bearing    Therapy/Group: Individual Therapy  Willow Ora, PTA 10/27/2018, 4:11 PM

## 2018-10-28 ENCOUNTER — Inpatient Hospital Stay (HOSPITAL_COMMUNITY): Payer: Non-veteran care

## 2018-10-28 LAB — URINE CULTURE: Culture: <10000 — AB

## 2018-10-28 LAB — GLUCOSE, CAPILLARY
Glucose-Capillary: 108 mg/dL — ABNORMAL HIGH (ref 70–99)
Glucose-Capillary: 200 mg/dL — ABNORMAL HIGH (ref 70–99)
Glucose-Capillary: 94 mg/dL (ref 70–99)
Glucose-Capillary: 81 mg/dL (ref 70–99)

## 2018-10-28 NOTE — Progress Notes (Signed)
Occupational Therapy Session Note  Patient Details  Name: Daniel Campos MRN: [id number] Date of Birth: July 26, 1955  Today's Date: 10/28/2018 OT Individual Time: 6333-5456 OT Individual Time Calculation (min): 56 min   Session 2: OT Individual Time: 2563-8937 OT Individual Time Calculation (min): 39 min    Short Term Goals: Week 1:  OT Short Term Goal 1 (Week 1): Pt will be able to complete toilet transfers with S. OT Short Term Goal 2 (Week 1): Pt will complete toileting with S. OT Short Term Goal 3 (Week 1): Pt will complete LB dressing with S.  Skilled Therapeutic Interventions/Progress Updates:    Pt received EOB eating breakfast. Pt was provided a hot cup of coffee and this improved rapport between OT and pt. Pt agreeable to wash up at sink. Pt completed UB bathing with cueing for initiation and no assist needed. Pt demonstrated several unsafe behaviors during LB dressing, attempting to stand without w/c locked and standing very far away from sink and attempting to reach forward. Likely this is just part of pt's personality. Following cueing for safety, pt required CGA for balance support to pull up pants in standing. A standing mirror was obtained and pt edu in mirror therapy. Pt quite resistive to this initially but then participated and reported he could carryover at home. Pt completed sit > stand with RW with CGA and extensive cueing/visual cues used for pt to position L LE in neutral. Pt was left sitting up in w/c with all needs met, belt alarm fastened.   Session 2: Pt received supine, no c/o pain. Pt transitioned to EOB with (S). He completed lateral scoot to w/c with (S). Minimal cueing for w/c positioning. Pt propelled w/c to therapy mat in hallway, 75 ft with (S). Pt transferred onto mat and required min A to transition into prone. Edu provided re benefits of prone positioning of residual limb positioning. Mirror used to provide visual feedback for residual limb positioning.  With tactile cues pt able to activate glutes and lift L leg into extension with min A. Pt returned to w/c and was left sitting up in his room with all needs within reach. Chair alarm belt fastened.   Therapy Documentation Precautions:  Precautions Precautions: Fall Restrictions Weight Bearing Restrictions: Yes LLE Weight Bearing: Non weight bearing   Therapy/Group: Individual Therapy  Curtis Sites 10/28/2018, 7:15 AM

## 2018-10-28 NOTE — Progress Notes (Signed)
Physical Therapy Session Note  Patient Details  Name: Daniel Campos MRN: [id number] Date of Birth: 1955-10-15  Today's Date: 10/28/2018 PT Individual Time: 1100-1226 PT Individual Time Calculation (min): 86 min   Short Term Goals: Week 1:  PT Short Term Goal 1 (Week 1): STG=LTG due to ELOS  Skilled Therapeutic Interventions/Progress Updates:    Pt supine in bed upon PT arrival, agreeable to therapy tx and reports pain 6/10 in L distal limb. Pt transferred to sitting with supervision and performed squat pivot to the w/c with CGA and cues for techniques. Pt worked on w/c propulsion this session in order to propel w/c throughout the unit x 150 ft with supervision using B UEs. Pt performed squat pivot transfer this session from w/c<>therapy mat with CGA and cues for set up/techniques. Pt performed x 4 sit<>stands throughout this session with CGA and RW, cues for techniques and hand placement. Pt worked on standing balance and standing activity tolerance with single UE support on RW to perform card matching activity x 2 trials with CGA. Pt performed pre-gait to perform hopping in place with R LE, UE support on RW with cues for swing through technique, min assist. Pt ambulated 3 x 10 ft this session with RW and min assist, cues for techniques and swing through technique. Pt propelled w/c x 50 ft to the nustep and performed squat pivot from w/c<>nustep with CGA. Pt used nustep this session x 8 minutes on workload 5 for global strengthening and endurance using B UEs and R LE. Pt propelled to therapy mat and performed squat pivot to the mat with supervision. Pt transferred to supine and performed supine LE therex for LE strengthening, 2 x 10 each with cues for techniques: straight leg raises, supine hip abduction, hip flexion and R LE bridges. Pt performed UE strengthening exercises including 2 x 10 each: supine chest press 3# dumbbell, supine shoulder flexion 3# dumbbell. Pt propelled back to room and left  seated in w/c with needs in reach and chair alarm set.   Therapy Documentation Precautions:  Precautions Precautions: Fall Restrictions Weight Bearing Restrictions: Yes LLE Weight Bearing: Non weight bearing    Therapy/Group: Individual Therapy  Netta Corrigan, PT, DPT 10/28/2018, 7:53 AM

## 2018-10-28 NOTE — Plan of Care (Signed)
  Problem: RH BOWEL ELIMINATION Goal: RH STG MANAGE BOWEL WITH ASSISTANCE Description: STG Manage Bowel with min Assistance. Outcome: Progressing Goal: RH STG MANAGE BOWEL W/MEDICATION W/ASSISTANCE Description: STG Manage Bowel with Medication with supervision Assistance. Outcome: Progressing   Problem: RH SKIN INTEGRITY Goal: RH STG SKIN FREE OF INFECTION/BREAKDOWN Description: Pt will remain free from skin breakdown or infection during stay with min assist Outcome: Progressing Goal: RH STG MAINTAIN SKIN INTEGRITY WITH ASSISTANCE Description: STG Maintain Skin Integrity With min Assistance. Outcome: Progressing Goal: RH STG ABLE TO PERFORM INCISION/WOUND CARE W/ASSISTANCE Description: STG Able To Perform Incision/Wound Care With mod Assistance. Outcome: Progressing   Problem: RH SAFETY Goal: RH STG ADHERE TO SAFETY PRECAUTIONS W/ASSISTANCE/DEVICE Description: STG Adhere to Safety Precautions With min Assistance/Device. Outcome: Progressing   Problem: RH PAIN MANAGEMENT Goal: RH STG PAIN MANAGED AT OR BELOW PT'S PAIN GOAL Description: Less than 4 on 0-10 scale  Outcome: Progressing   Problem: Consults Goal: RH LIMB LOSS PATIENT EDUCATION Description: Description: See Patient Education module for eduction specifics. Outcome: Progressing   Problem: RH KNOWLEDGE DEFICIT LIMB LOSS Goal: RH STG INCREASE KNOWLEDGE OF SELF CARE AFTER LIMB LOSS Description: Pt will demonstrate techniques to prevent limb loss injury and decrease pain sensation  Outcome: Progressing   Problem: Consults Goal: Skin Care Protocol Initiated - if Braden Score 18 or less Description: If consults are not indicated, leave blank or document N/A Outcome: Progressing Goal: Nutrition Consult-if indicated Outcome: Progressing Goal: Diabetes Guidelines if Diabetic/Glucose > 140 Description: If diabetic or lab glucose is > 140 mg/dl - Initiate Diabetes/Hyperglycemia Guidelines & Document Interventions   Outcome: Progressing

## 2018-10-28 NOTE — Progress Notes (Signed)
Daniel Campos, NT and I are at  the patients bedside after voiding. (PVR) per bladder scanner is 45ml. Pt educated on the importance of emptying his bladder via straight cath per MD order. Pt is alert and oriented x4. Pt made aware of risk and benefits of not emptying his bladder and being straight cathed.  Pt still refuses to be straight cathed at this time. Pt states" give me a break I will pee later". Will pass on to oncoming day shift nurse to reassess and continue to monitor urinary output.

## 2018-10-28 NOTE — Progress Notes (Signed)
[facility] PHYSICAL MEDICINE & REHABILITATION PROGRESS NOTE   Subjective/Complaints:  Patient feels well this morning.  He is up in bed eating.  He denies any significant pain.  Objective:   No results found. Recent Labs    10/26/18 0351  WBC 14.1*  HGB 8.6*  HCT 27.0*  PLT 747*   Recent Labs    10/26/18 0351  NA 133*  K 4.4  CL 104  CO2 22  GLUCOSE 111*  BUN 12  CREATININE 0.77  CALCIUM 8.1*     Physical Exam: Vital Signs Blood pressure 131/79, pulse 92, temperature 98.1 F (36.7 C), temperature source Oral, resp. rate 16, height 5\' 11"  (1.803 m), weight 96.1 kg, SpO2 98 %.   No acute distress. HEENT exam atraumatic, normocephalic Neck is supple Chest clear to auscultation Cardiac exam S1-S2 regular Abdominal exam active bowel sounds, soft Extremities with left AKA.   Assessment/Plan: 1. Functional deficits secondary to left AKA   Medical Problem List and Plan: 1. 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 Antithrombotics: -DVT/anticoagulation:Pharmaceutical:Lovenox -antiplatelet therapy: ASA 81mg  daily 3. Pain Management:Patient without significant pain. 4. Mood:team to provide ego support as needed  -neuropsych eval -antipsychotic agents: NA 5. Neuropsych: This patientiscapable of making decisions on hisown behalf. 6. Skin/Wound Care:Pre-albumin 6.8--added proteins supplements for malnutrition and to promote wound healing.  -local care and ACE to stump 7. Fluids/Electrolytes/Nutrition:Monitor I/O--i 8. 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 Donnetta Hutching in 4 weeks for staple removal  9. Acute on chronic anemia/H/o GIB:   -most recent HGB up to 8.6 10: HTN:     90/64-132/82   controlled  11. T2DM with peripheral neuropathy: Hgb A1C-7.7.Lantus was decreased to 15 units today to avoid hypoglycemia.now sugars  trending up over last 24hr  -further adjustment based on cbg patterns CBG (last 3)  Recent Labs    10/27/18 1638 10/27/18 2125 10/28/18 0610  GLUCAP 226* 73 108*   Variable , will follow for now 12. Leucocytosis:  Lab Results  Component Value Date   WBC 14.1 (H) 10/26/2018  will follow   13. Urinary retention:foley discontinued.  UA equivocal, UCX pending.  -continue bladder program/training with I/O cath if needed 14. Constipation:augmented  bowel program with results  -encouraged pt that things should settle down today    LOS: 3 days A FACE TO FACE EVALUATION WAS PERFORMED  Bruce H Swords 10/28/2018, 10:17 AM

## 2018-10-29 LAB — GLUCOSE, CAPILLARY
Glucose-Capillary: 112 mg/dL — ABNORMAL HIGH (ref 70–99)
Glucose-Capillary: 75 mg/dL (ref 70–99)
Glucose-Capillary: 138 mg/dL — ABNORMAL HIGH (ref 70–99)
Glucose-Capillary: 100 mg/dL — ABNORMAL HIGH (ref 70–99)

## 2018-10-29 MED ORDER — NAPHAZOLINE-GLYCERIN 0.012-0.2 % OP SOLN
1.0000 [drp] | Freq: Four times a day (QID) | OPHTHALMIC | Status: DC | PRN
  Filled 2018-10-29: qty 15

## 2018-10-29 NOTE — Progress Notes (Signed)
 PHYSICAL MEDICINE & REHABILITATION PROGRESS NOTE   Subjective/Complaints:  Patient feels well.  He is eating well.  He has no complaints.  Objective:   Physical Exam: Vital Signs Blood pressure 126/80, pulse 97, temperature 97.9 F (36.6 C), temperature source Oral, resp. rate 16, height 5\' 11"  (1.803 m), weight 96.1 kg, SpO2 96 %.    well-developed well-nourished male in no acute distress. HEENT exam atraumatic, normocephalic, neck supple without jugular venous distention. Chest clear to auscultation cardiac exam S1-S2 are regular. Abdominal exam overweight with bowel sounds, soft and nontender. Extremities left AKA. Neurologic exam is alert   Assessment/Plan: 1. Functional deficits secondary to left AKA   Medical Problem List and Plan: 1. 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 Antithrombotics: -DVT/anticoagulation:Pharmaceutical:Lovenox -antiplatelet therapy: ASA 81mg  daily 3. Pain Management:Patient without significant pain. 4. Mood:team to provide ego support as needed  -neuropsych eval -antipsychotic agents: NA 5. Neuropsych: This patientiscapable of making decisions on hisown behalf. 6. Skin/Wound Care:Pre-albumin 6.8--added proteins supplements for malnutrition and to promote wound healing.  -local care and ACE to stump 7. Fluids/Electrolytes/Nutrition:Monitor I/O--i 8. 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 Donnetta Hutching in 4 weeks for staple removal  9. Acute on chronic anemia/H/o GIB:   -most recent HGB up to 8.6 10: HTN:     90/64-132/82   controlled  11. T2DM with peripheral neuropathy: Hgb A1C-7.7.Lantus was decreased to 15 units today to avoid hypoglycemia.now sugars trending up over last 24hr  -further adjustment based on cbg patterns CBG (last 3)  Recent Labs    10/28/18 1643 10/28/18 2104 10/29/18 0628   GLUCAP 94 81 112*   Variable , will follow for now 12. Leucocytosis: no s/s infection Lab Results  Component Value Date   WBC 14.1 (H) 10/26/2018  will follow   13. Urinary retention:foley discontinued.  UA equivocal, UCX unremarkable  - 14. Constipation: resolved    LOS: 4 days A FACE TO FACE EVALUATION WAS PERFORMED  Bruce H Swords 10/29/2018, 7:25 AM

## 2018-10-29 NOTE — Plan of Care (Signed)
  Problem: RH BOWEL ELIMINATION Goal: RH STG MANAGE BOWEL WITH ASSISTANCE Description: STG Manage Bowel with min Assistance. Outcome: Progressing Goal: RH STG MANAGE BOWEL W/MEDICATION W/ASSISTANCE Description: STG Manage Bowel with Medication with supervision Assistance. Outcome: Progressing   Problem: RH BLADDER ELIMINATION Goal: RH STG MANAGE BLADDER WITH ASSISTANCE Description: STG Manage Bladder With min Assistance Outcome: Progressing Goal: RH STG MANAGE BLADDER WITH EQUIPMENT WITH ASSISTANCE Description: STG Manage Bladder With Equipment With min Assistance Outcome: Progressing   Problem: RH SKIN INTEGRITY Goal: RH STG SKIN FREE OF INFECTION/BREAKDOWN Description: Pt will remain free from skin breakdown or infection during stay with min assist Outcome: Progressing Goal: RH STG MAINTAIN SKIN INTEGRITY WITH ASSISTANCE Description: STG Maintain Skin Integrity With min Assistance. Outcome: Progressing Goal: RH STG ABLE TO PERFORM INCISION/WOUND CARE W/ASSISTANCE Description: STG Able To Perform Incision/Wound Care With mod Assistance. Outcome: Progressing   Problem: RH SAFETY Goal: RH STG ADHERE TO SAFETY PRECAUTIONS W/ASSISTANCE/DEVICE Description: STG Adhere to Safety Precautions With min Assistance/Device. Outcome: Progressing   Problem: RH PAIN MANAGEMENT Goal: RH STG PAIN MANAGED AT OR BELOW PT'S PAIN GOAL Description: Less than 4 on 0-10 scale  Outcome: Progressing   Problem: Consults Goal: RH LIMB LOSS PATIENT EDUCATION Description: Description: See Patient Education module for eduction specifics. Outcome: Progressing   Problem: RH KNOWLEDGE DEFICIT LIMB LOSS Goal: RH STG INCREASE KNOWLEDGE OF SELF CARE AFTER LIMB LOSS Description: Pt will demonstrate techniques to prevent limb loss injury and decrease pain sensation  Outcome: Progressing   Problem: Consults Goal: Skin Care Protocol Initiated - if Braden Score 18 or less Description: If consults are not  indicated, leave blank or document N/A Outcome: Progressing Goal: Diabetes Guidelines if Diabetic/Glucose > 140 Description: If diabetic or lab glucose is > 140 mg/dl - Initiate Diabetes/Hyperglycemia Guidelines & Document Interventions  Outcome: Progressing

## 2018-10-30 ENCOUNTER — Inpatient Hospital Stay (HOSPITAL_COMMUNITY): Payer: Non-veteran care | Admitting: Physical Therapy

## 2018-10-30 ENCOUNTER — Inpatient Hospital Stay (HOSPITAL_COMMUNITY): Payer: Non-veteran care | Admitting: Occupational Therapy

## 2018-10-30 DIAGNOSIS — D649 Anemia, unspecified: Secondary | ICD-10-CM

## 2018-10-30 DIAGNOSIS — R339 Retention of urine, unspecified: Secondary | ICD-10-CM

## 2018-10-30 DIAGNOSIS — E162 Hypoglycemia, unspecified: Secondary | ICD-10-CM

## 2018-10-30 DIAGNOSIS — I10 Essential (primary) hypertension: Secondary | ICD-10-CM

## 2018-10-30 DIAGNOSIS — R7309 Other abnormal glucose: Secondary | ICD-10-CM

## 2018-10-30 DIAGNOSIS — D72829 Elevated white blood cell count, unspecified: Secondary | ICD-10-CM

## 2018-10-30 LAB — BASIC METABOLIC PANEL
Anion gap: 8 (ref 5–15)
BUN: 16 mg/dL (ref 8–23)
CO2: 27 mmol/L (ref 22–32)
Calcium: 8.5 mg/dL — ABNORMAL LOW (ref 8.9–10.3)
Chloride: 102 mmol/L (ref 98–111)
Creatinine, Ser: 0.82 mg/dL (ref 0.61–1.24)
GFR calc Af Amer: >60 mL/min (ref >60)
GFR calc non Af Amer: >60 mL/min (ref >60)
Glucose, Bld: 59 mg/dL — ABNORMAL LOW (ref 70–99)
Potassium: 4.5 mmol/L (ref 3.5–5.1)
Sodium: 137 mmol/L (ref 135–145)

## 2018-10-30 LAB — GLUCOSE, CAPILLARY
Glucose-Capillary: 50 mg/dL — ABNORMAL LOW (ref 70–99)
Glucose-Capillary: 56 mg/dL — ABNORMAL LOW (ref 70–99)
Glucose-Capillary: 138 mg/dL — ABNORMAL HIGH (ref 70–99)
Glucose-Capillary: 116 mg/dL — ABNORMAL HIGH (ref 70–99)
Glucose-Capillary: 88 mg/dL (ref 70–99)

## 2018-10-30 LAB — MAGNESIUM: Magnesium: 1.5 mg/dL — ABNORMAL LOW (ref 1.7–2.4)

## 2018-10-30 LAB — CBC
HCT: 25.6 % — ABNORMAL LOW (ref 39.0–52.0)
Hemoglobin: 8.1 g/dL — ABNORMAL LOW (ref 13.0–17.0)
MCH: 27.1 pg (ref 26.0–34.0)
MCHC: 31.6 g/dL (ref 30.0–36.0)
MCV: 85.6 fL (ref 80.0–100.0)
Platelets: 727 10*3/uL — ABNORMAL HIGH (ref 150–400)
RBC: 2.99 MIL/uL — ABNORMAL LOW (ref 4.22–5.81)
RDW: 15.4 % (ref 11.5–15.5)
WBC: 10.6 10*3/uL — ABNORMAL HIGH (ref 4.0–10.5)
nRBC: 0 % (ref 0.0–0.2)

## 2018-10-30 MED ORDER — COLLAGENASE 250 UNIT/GM EX OINT
TOPICAL_OINTMENT | Freq: Two times a day (BID) | CUTANEOUS | Status: DC
  Administered 2018-10-30 – 2018-11-01 (×4): via TOPICAL
  Filled 2018-10-30: qty 30

## 2018-10-30 MED ORDER — TAMSULOSIN HCL 0.4 MG PO CAPS
0.4000 mg | ORAL_CAPSULE | Freq: Every day | ORAL | Status: DC
  Administered 2018-10-30 – 2018-10-31 (×2): 0.4 mg via ORAL
  Filled 2018-10-30 (×2): qty 1

## 2018-10-30 NOTE — Progress Notes (Signed)
Occupational Therapy Session Note  Patient Details  Name: Daniel Campos MRN: [id number] Date of Birth: 06/11/1955  Today's Date: 10/30/2018 OT Individual Time: 7579-7282 OT Individual Time Calculation (min): 39 min    Short Term Goals: Week 1:  OT Short Term Goal 1 (Week 1): Pt will be able to complete toilet transfers with S. OT Short Term Goal 2 (Week 1): Pt will complete toileting with S. OT Short Term Goal 3 (Week 1): Pt will complete LB dressing with S.  Skilled Therapeutic Interventions/Progress Updates:    Upon entering the room, pt sleeping soundly but agreeable to OT intervention. Pt performed bed mobility with supervision and with no c/o pain. Pt performing stand <>controlled sit with RW on EOB x 10 reps with close supervision for safety. Pt ambulating 20' x 2 reps with close supervision as well. OT demonstrating side stepping with RW and pt performing 10' with CGA for balance. Pt seated in chair with no arm rest present and requiring min guard for safety with stand. Pt returning to bed and OT discussing fall risks within home. Pt verbalized feeling "good" about home situation and not worried about fall risk. Pt with decreased insight in this regard. Pt returning to bed with call bell and all needed items within reach. Bed alarm activated.   Therapy Documentation Precautions:  Precautions Precautions: Fall Restrictions Weight Bearing Restrictions: Yes LLE Weight Bearing: Non weight bearing General: General PT Missed Treatment Reason: Other (Comment)(eating lunch) ADL: ADL ADL Comments: Pt refused all mobility and self care due to 10/10 pain.   Therapy/Group: Individual Therapy  Gypsy Decant 10/30/2018, 3:56 PM

## 2018-10-30 NOTE — Progress Notes (Signed)
Pt had hypoglycemic event this morning. BS @ [phone] was 56 after second check. Pt ate graham crackers and drank a sprite and refused for 7p7a nurse to recheck BS. He stated he was fine and that he been diabetic for many years he knew what he needed to do. Pt is A&O x4. No s/s of hypoglycemia noted. Will cont to monitor.  Erie Noe, RN

## 2018-10-30 NOTE — Progress Notes (Signed)
Occupational Therapy Session Note  Patient Details  Name: Daniel Campos MRN: [id number] Date of Birth: [birth year]  Today's Date: 10/30/2018 OT Individual time:  [phone],   60 min      Short Term Goals: Week 1:  OT Short Term Goal 1 (Week 1): Pt will be able to complete toilet transfers with S. OT Short Term Goal 2 (Week 1): Pt will complete toileting with S. OT Short Term Goal 3 (Week 1): Pt will complete LB dressing with S.  Skilled Therapeutic Interventions/Progress Updates:    Pt was received in bed and stated he was in pain but he could tolerate it and was ready for therapy.  Pt was able to sit to EOB independently and stood to RW and transferred to w/c with S.  He stated he already bathed and dressed this am.   Pt self propelled at a good pace in w/c to therapy gym (over 300 ft). He demonstrated how he used to complete squat pivots from his w/c to his tub bench at home. Pt has been doing this transfer for some time at home and feels confident with it. Pt was able to transfer, prepare chair all with mod I.  Discussed cooking strategies from w/c level to protect his legs/lap. Pt stated he did cook from w/c level at home in past, he also uses arm length pot holders.   Pt then worked on UE strengthening with 5 lb hand wt for bicep curls, tricep extension and 3lb for shoulder presses.  15 reps, 3 sets. AROM shoulder exercises.   Pt expressed that he is feeling ready to go home as he feels very confident with his w/c transfers and standing with RW.  Discussed possibility of Wednesday discharge, will discuss with PT, MD, SW.    Pt returned to room and used squat pivot (his preference over RW) back to bed with S only.     Pt resting in bed. Provided pt with new ice pack.    Therapy Documentation Precautions:  Precautions Precautions: Fall Restrictions Weight Bearing Restrictions: Yes LLE Weight Bearing: Non weight bearing   Pain: Pain Assessment Pain Scale: 0-10 Pain Score:  6  Pain Type: Surgical pain Pain Location: Leg Pain Orientation: Left Pain Descriptors / Indicators: Aching;Tightness Pain Frequency: Intermittent Pain Onset: Gradual Pain Intervention(s): Cold applied;Repositioned ADL: ADL ADL Comments: Pt refused all mobility and self care due to 10/10 pain.   Therapy/Group: Individual Therapy  [place] 10/30/2018, 10:10 AM

## 2018-10-30 NOTE — Progress Notes (Signed)
Calorie Count Note: Day 2  48-hour calorie count ordered and started 6/11 at lunch meal. Calorie count ended on 6/13 after breakfast meal.  Only 1 meal slip available in pt's calorie count envelope. However, lunch on 6/12 documented as 50% and dinner on 6/12 documented as 75%.  Diet: Heart Healthy/Carb Modified Supplements: - Juven BID - Ensure Max BID  6/13  Breakfast: 241 kcal, 19 grams of protein Supplements: - 2 Juven and 2 Ensure Max per MAR (490 kcal, 65 grams of protein)  Total 24-hour intake based on 1 meal slip and supplements: 731 kcal but likely more (37% of minimum estimated needs)  84 grams of protein but likely more (80% of minimum estimated needs)  Nutrition Diagnosis: Increased nutrient needsrelated to wound healing, post-op healingas evidenced by estimated needs.  Goal: Patient will meet greater than or equal to 90% of their needs  Intervention:  - d/c 48-hour calorie count  - ContinueEnsure Max poBID, each supplement provides 150 kcal and 30 grams of protein  - Continue1 packet Juven BID, each packet provides 95 calories, 2.5 grams of protein (collagen), and 9.8 grams of carbohydrate (3 grams sugar); also contains 7 grams of L-arginine and L-glutamine, 300 mg vitamin C, 15 mg vitamin E, 1.2 mcg vitamin B-12, 9.5 mg zinc, 200 mg calcium, and 1.5 g Calcium Beta-hydroxy-Beta-methylbutyrate to support wound healing  - Continue MVI with minerals daily  - RD will continue to follow pt   Gaynell Face, MS, RD, LDN Inpatient Clinical Dietitian Pager: [phone] Weekend/After Hours: [phone]

## 2018-10-30 NOTE — Progress Notes (Signed)
Physical Therapy Session Note  Patient Details  Name: Daniel Campos MRN: [id number] Date of Birth: Jun 28, 1955  Today's Date: 10/30/2018 PT Individual Time: 6144-3154; 1300-1400 PT Individual Time Calculation (min): 45 min and 60 min PT Missed Time: 15 min Missed Time Reason: Pt just received lunch  Short Term Goals: Week 1:  PT Short Term Goal 1 (Week 1): STG=LTG due to ELOS  Skilled Therapeutic Interventions/Progress Updates:   Session 1: Pt received seated in bed eating breakfast, agreeable to PT session. Pt reports 6/10 pain in L residual limb and that pain kept him up most of the night and over the weekend. Provide ice pack for pain relief. Pt is setup A to doff underwear and don shirt and pants while seated in bed. Supine to sitting EOB with Supervision with HOB elevated. Sit to stand with CGA to RW. Pt is CGA for standing balance while pulling pants up over bottom. Pt reports feeling unsteady in standing. Lateral scoot transfer bed to w/c with CGA. Pt is dependent to don ACE wrap and residual limb sock while seated in w/c. Pt left seated in w/c in room with needs in reach at end of session.  Session 2: Pt received supine in bed taking a nap, agreeable to PT session. No complaints of pain and pt is using icepack intermittently on residual limb for pain relief. Pt with pillow under residual limb, education with patient and positioning and to not use a pillow under limb to avoid hip flexion contracture. Pt resistant to education and reports he is going to position himself however he is comfortable. Education with patient that if he plans to wear a prosthetic limb he needs to avoid joint contractures. Supine to sit with Supervision from flat bed without use of bedrails. Squat pivot transfer w/c to/from bed with Supervision. Stand pivot transfer w/c to/from bed with RW and CGA. Ambulation x 25 ft with RW and CGA. Pt exhibits poor safety awareness with gait and decreased attention to onset of  fatigue, requires v/c to safely sit in w/c. Reviewed how to ACE wrap residual limb and don limb sock. Education with patient about phantom limb pain and discussed mirror therapy and desensitization techniques. Provided HEP handout of AKA therex, will review with pt next session. CSW present during session to discuss equipment needs and pt home setup. Per pt he has one step to get up onto this porch and his family has been bumping him up the one step in his w/c or he has been walking up the step with his RW. Education with pt that he will need to practice going up a step similar to his home environment, pt insisting that he has performed this before coming to the hospital and will be fine doing it once he is home, reminded pt he has one less leg to perform steps with when he goes home. CSW followed up with family and pt actually has two steps to get up onto porch, will practice steps next session. Pt missed 15 min at end of session as his lunch just arrived and he wished to eat. Pt left seated in w/c in room with needs in reach.  Therapy Documentation Precautions:  Precautions Precautions: Fall Restrictions Weight Bearing Restrictions: Yes LLE Weight Bearing: Non weight bearing    Therapy/Group: Individual Therapy   Excell Seltzer, PT, DPT  10/30/2018, 9:54 AM

## 2018-10-30 NOTE — Plan of Care (Signed)
  Problem: RH BOWEL ELIMINATION Goal: RH STG MANAGE BOWEL WITH ASSISTANCE Description: STG Manage Bowel with min Assistance. Outcome: Progressing Goal: RH STG MANAGE BOWEL W/MEDICATION W/ASSISTANCE Description: STG Manage Bowel with Medication with supervision Assistance. Outcome: Progressing   Problem: RH BLADDER ELIMINATION Goal: RH STG MANAGE BLADDER WITH ASSISTANCE Description: STG Manage Bladder With min Assistance Outcome: Progressing Goal: RH STG MANAGE BLADDER WITH EQUIPMENT WITH ASSISTANCE Description: STG Manage Bladder With Equipment With min Assistance Outcome: Progressing   Problem: RH SKIN INTEGRITY Goal: RH STG SKIN FREE OF INFECTION/BREAKDOWN Description: Pt will remain free from skin breakdown or infection during stay with min assist Outcome: Progressing Goal: RH STG MAINTAIN SKIN INTEGRITY WITH ASSISTANCE Description: STG Maintain Skin Integrity With min Assistance. Outcome: Progressing Goal: RH STG ABLE TO PERFORM INCISION/WOUND CARE W/ASSISTANCE Description: STG Able To Perform Incision/Wound Care With mod Assistance. Outcome: Progressing   Problem: RH SAFETY Goal: RH STG ADHERE TO SAFETY PRECAUTIONS W/ASSISTANCE/DEVICE Description: STG Adhere to Safety Precautions With min Assistance/Device. Outcome: Progressing   Problem: RH PAIN MANAGEMENT Goal: RH STG PAIN MANAGED AT OR BELOW PT'S PAIN GOAL Description: Less than 4 on 0-10 scale  Outcome: Progressing   Problem: Consults Goal: RH LIMB LOSS PATIENT EDUCATION Description: Description: See Patient Education module for eduction specifics. Outcome: Progressing   Problem: RH KNOWLEDGE DEFICIT LIMB LOSS Goal: RH STG INCREASE KNOWLEDGE OF SELF CARE AFTER LIMB LOSS Description: Pt will demonstrate techniques to prevent limb loss injury and decrease pain sensation  Outcome: Progressing   Problem: Consults Goal: Skin Care Protocol Initiated - if Braden Score 18 or less Description: If consults are not  indicated, leave blank or document N/A Outcome: Progressing

## 2018-10-30 NOTE — Progress Notes (Signed)
[name] PHYSICAL MEDICINE & REHABILITATION PROGRESS NOTE   Subjective/Complaints: Patient seen sitting up in bed this morning.  He states he did not sleep well overnight due to pain.  Discussed dressing changes with nursing, as well as Hemoccult as well as urinary retention.  ROS: Denies CP, SOB, N/V/D  Objective:   No results found. Recent Labs    10/30/18 0622  WBC 10.6*  HGB 8.1*  HCT 25.6*  PLT 727*   Recent Labs    10/30/18 0622  NA 137  K 4.5  CL 102  CO2 27  GLUCOSE 59*  BUN 16  CREATININE 0.82  CALCIUM 8.5*    Intake/Output Summary (Last 24 hours) at 10/30/2018 1146 Last data filed at 10/30/2018 0525 Gross per 24 hour  Intake 698 ml  Output 2450 ml  Net -1752 ml     Physical Exam: Vital Signs Blood pressure 140/83, pulse 97, temperature 98.1 F (36.7 C), temperature source Oral, resp. rate 16, height 5\' 11"  (1.803 m), weight 96.1 kg, SpO2 95 %. Constitutional: No distress . Vital signs reviewed. HENT: Normocephalic.  Atraumatic. Eyes: EOMI. No discharge. Cardiovascular: No JVD. Respiratory: Normal effort. GI: Non-distended. Musc: Left stump edema and tenderness. Neurological: Alert and oriented Motor: Bilateral upper extremities: 5/5 proximal distal Right lower extremity: 4/5 proximal distal Left lower extremity: Hip flexion 4/5 Sensation intact light touch Skin: Left AKA with staples C/D/I Right posterior distal leg wound with ulcer with slough Psych: Normal mood.  Normal affect.    Assessment/Plan: 1. Functional deficits secondary to left AKA which require 3+ hours per day of interdisciplinary therapy in a comprehensive inpatient rehab setting.  Physiatrist is providing close team supervision and 24 hour management of active medical problems listed below.  Physiatrist and rehab team continue to assess barriers to discharge/monitor patient progress toward functional and medical goals  Care Tool:  Bathing  Bathing activity did not  occur: Refused Body parts bathed by patient: Right arm, Left arm, Chest, Abdomen, Front perineal area, Right upper leg, Left upper leg, Right lower leg, Face   Body parts bathed by helper: Buttocks Body parts n/a: Left lower leg   Bathing assist Assist Level: Minimal Assistance - Patient > 75%     Upper Body Dressing/Undressing Upper body dressing Upper body dressing/undressing activity did not occur (including orthotics): Refused What is the patient wearing?: Pull over shirt    Upper body assist Assist Level: Set up assist    Lower Body Dressing/Undressing Lower body dressing    Lower body dressing activity did not occur: Refused What is the patient wearing?: Pants     Lower body assist Assist for lower body dressing: Moderate Assistance - Patient 50 - 74%     Toileting Toileting Toileting Activity did not occur Landscape architect and hygiene only): Refused  Toileting assist Assist for toileting: Contact Guard/Touching assist     Transfers Chair/bed transfer  Transfers assist  Chair/bed transfer activity did not occur: Refused  Chair/bed transfer assist level: Contact Guard/Touching assist     Locomotion Ambulation   Ambulation assist   Ambulation activity did not occur: Safety/medical concerns  Assist level: Minimal Assistance - Patient > 75% Assistive device: Walker-rolling Max distance: 10 ft   Walk 10 feet activity   Assist  Walk 10 feet activity did not occur: Safety/medical concerns  Assist level: Minimal Assistance - Patient > 75% Assistive device: Walker-rolling   Walk 50 feet activity   Assist Walk 50 feet with 2 turns activity did not  occur: Safety/medical concerns         Walk 150 feet activity   Assist Walk 150 feet activity did not occur: Safety/medical concerns         Walk 10 feet on uneven surface  activity   Assist Walk 10 feet on uneven surfaces activity did not occur: Safety/medical concerns          Wheelchair     Assist Will patient use wheelchair at discharge?: Yes Type of Wheelchair: Manual    Wheelchair assist level: Supervision/Verbal cueing Max wheelchair distance: 150 ft    Wheelchair 50 feet with 2 turns activity    Assist        Assist Level: Supervision/Verbal cueing   Wheelchair 150 feet activity     Assist     Assist Level: Supervision/Verbal cueing     Medical Problem List and Plan: 1. 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 Antithrombotics: -DVT/anticoagulation:Pharmaceutical:Lovenox -antiplatelet therapy: ASA 81mg  daily 3. Pain Management:On Lyrica tid.  Oxycodone prn.  Encouraged local measures to stop for pain relief 4. Mood:team to provide ego support as needed  -neuropsych eval -antipsychotic agents: NA 5. Neuropsych: This patientiscapable of making decisions on hisown behalf. 6. Skin/Wound Care: Added proteins supplements for malnutrition and to promote wound healing.  -local care and ACE to stump  Increased stent ~twice daily for right lower extremity 7. Fluids/Electrolytes/Nutrition:Monitor I/Os 8. 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 Donnetta Hutching in 4 weeks for staple removal  9. Acute on chronic anemia/H/o GIB:   Hemoglobin 8.1 on 6/15  Continue to monitor 10: HTN: Monitor BP tid  Relatively controlled on 6/15  Heart rate slightly elevated, continue to monitor 11. T2DM with peripheral neuropathy: Hgb A1C-7.7.  Lantus DC'd on 6/15  NovoLog 5 units 3 times daily  Labile with hypoglycemia this a.m.,  12. Leucocytosis:   WBCs 10.6 on 6/15  Afebrile  Monitor for signs of infection/fevers.  13. Urinary retention:  UA equivocal, UCX insignificant growth  Flomax started on 6/15  -continue bladder program/training with I/O cath if needed 14.  Constipation:augmented  bowel program with results   LOS: 5 days A FACE TO FACE EVALUATION WAS PERFORMED  Daniel Campos 10/30/2018, 11:46 AM

## 2018-10-31 ENCOUNTER — Inpatient Hospital Stay (HOSPITAL_COMMUNITY): Payer: Non-veteran care

## 2018-10-31 ENCOUNTER — Inpatient Hospital Stay (HOSPITAL_COMMUNITY): Payer: Non-veteran care | Admitting: Physical Therapy

## 2018-10-31 ENCOUNTER — Inpatient Hospital Stay (HOSPITAL_COMMUNITY): Payer: Non-veteran care | Admitting: Occupational Therapy

## 2018-10-31 LAB — GLUCOSE, CAPILLARY
Glucose-Capillary: 335 mg/dL — ABNORMAL HIGH (ref 70–99)
Glucose-Capillary: 252 mg/dL — ABNORMAL HIGH (ref 70–99)
Glucose-Capillary: 138 mg/dL — ABNORMAL HIGH (ref 70–99)
Glucose-Capillary: 122 mg/dL — ABNORMAL HIGH (ref 70–99)

## 2018-10-31 MED ORDER — MAGNESIUM SULFATE 2 GM/50ML IV SOLN
2.0000 g | Freq: Once | INTRAVENOUS | Status: AC
  Administered 2018-10-31: 13:00:00 2 g via INTRAVENOUS
  Filled 2018-10-31: qty 50

## 2018-10-31 MED ORDER — INSULIN GLARGINE 100 UNIT/ML ~~LOC~~ SOLN
6.0000 [IU] | Freq: Every day | SUBCUTANEOUS | Status: DC
  Administered 2018-10-31: 6 [IU] via SUBCUTANEOUS
  Filled 2018-10-31 (×2): qty 0.06

## 2018-10-31 MED ORDER — BETHANECHOL CHLORIDE 25 MG PO TABS
25.0000 mg | ORAL_TABLET | Freq: Three times a day (TID) | ORAL | Status: DC
  Administered 2018-10-31 – 2018-11-01 (×3): 25 mg via ORAL
  Filled 2018-10-31 (×3): qty 1

## 2018-10-31 NOTE — Progress Notes (Addendum)
Occupational Therapy Session Note  Patient Details  Name: Daniel Campos MRN: [id number] Date of Birth: Sep 08, 1955  Today's Date: 10/31/2018 OT Individual Time: 8329-1916 OT Individual Time Calculation (min): 35 min    Short Term Goals: Week 1:  OT Short Term Goal 1 (Week 1): Pt will be able to complete toilet transfers with S. OT Short Term Goal 2 (Week 1): Pt will complete toileting with S. OT Short Term Goal 3 (Week 1): Pt will complete LB dressing with S.  Skilled Therapeutic Interventions/Progress Updates:    Pt received in bed and was ready to get up for the day. He wanted to demonstrate how he can do everything independently.  He used his w/c primarily with squat pivot transfers from bed, toilet.  He bathed at sink this morning and dressed from w/c. He can rise to stand with RW.   Pt can also transfer to tub bench with mod I but opted not to shower today.  Pt said he knew how to wrap his leg and demonstrated a circular technique with ACE wrap. Recommended the criss cross method.  Demonstrated to patient and also provided a handout to pt.   Called his son to discuss his progress and how he is now at a mod I level.    Pt resting in room with all needs met.   Therapy Documentation Precautions:  Precautions Precautions: Fall Restrictions Weight Bearing Restrictions: Yes LLE Weight Bearing: Non weight bearing   Pain: Pain Assessment Pain Scale: 0-10 Pain Score: 7  Pain Type: Surgical pain;Acute pain Pain Location: Leg Pain Orientation: Left Pain Descriptors / Indicators: Throbbing Pain Onset: On-going Pain Intervention(s): Rest(therapy to tolerance - pt denies intervention stating only thing that helps is "laying back and giving it a break")    Therapy/Group: Individual Therapy  [place] 10/31/2018, 12:44 PM

## 2018-10-31 NOTE — Progress Notes (Signed)
Occupational Therapy Discharge Summary  Patient Details  Name: Daniel Campos MRN: [id number] Date of Birth: Jan 26, 1956     Patient has met 7 of 7 long term goals due to improved activity tolerance, improved balance and ability to compensate for deficits.  Patient to discharge at overall Modified Independent level with BADLs.  Patient's son and daughter are independent to provide the necessary physical assistance at discharge with complex IADLs.  Spoke with son via phone for family education.   Reasons goals not met: n/a  Recommendation:  Patient will benefit from ongoing skilled OT services in home health setting to continue to advance functional skills in the area of iADL.  Equipment: No equipment provided  Reasons for discharge: treatment goals met  Patient/family agrees with progress made and goals achieved: Yes  OT Discharge Precautions/Restrictions  Precautions Precautions: Fall Restrictions Weight Bearing Restrictions: Yes LLE Weight Bearing: Non weight bearing   ADL ADL ADL Comments: Mod I with all BADLs using squat pivot transfers from w/c. Vision Baseline Vision/History: Wears glasses Wears Glasses: Reading only Patient Visual Report: No change from baseline Vision Assessment?: No apparent visual deficits Perception  Perception: Within Functional Limits Praxis Praxis: Intact Cognition Overall Cognitive Status: Within Functional Limits for tasks assessed Arousal/Alertness: Awake/alert Orientation Level: Oriented X4 Attention: Focused;Sustained Focused Attention: Appears intact Sustained Attention: Appears intact Memory: Appears intact Awareness: Appears intact Safety/Judgment: Appears intact Sensation Sensation Light Touch: Appears Intact Hot/Cold: Appears Intact Proprioception: Appears Intact Stereognosis: Appears Intact Motor  Motor Motor: Within Functional Limits Mobility  Bed Mobility Supine to Sit: Independent Sit to Supine:  Independent Transfers Sit to Stand: Independent with assistive device  Trunk/Postural Assessment  Cervical Assessment Cervical Assessment: Exceptions to [facility] Thoracic Assessment Thoracic Assessment: Within Functional Limits Lumbar Assessment Lumbar Assessment: Within Functional Limits Postural Control Postural Control: Within Functional Limits  Balance Static Sitting Balance Static Sitting - Level of Assistance: 7: Independent Dynamic Sitting Balance Dynamic Sitting - Level of Assistance: 7: Independent Static Standing Balance Static Standing - Level of Assistance: 6: Modified independent (Device/Increase time) Extremity/Trunk Assessment RUE Assessment RUE Assessment: Within Functional Limits LUE Assessment LUE Assessment: Within Functional Limits   SAGUIER,JULIA 10/31/2018, 12:55 PM

## 2018-10-31 NOTE — Progress Notes (Signed)
Occupational Therapy Session Note  Patient Details  Name: Daniel Campos MRN: [id number] Date of Birth: Sep 24, 1955  Today's Date: 10/31/2018 OT Individual Time: 1600-1630 OT Individual Time Calculation (min): 30 min    Short Term Goals: Week 1:  OT Short Term Goal 1 (Week 1): Pt will be able to complete toilet transfers with S. OT Short Term Goal 2 (Week 1): Pt will complete toileting with S. OT Short Term Goal 3 (Week 1): Pt will complete LB dressing with S.  Skilled Therapeutic Interventions/Progress Updates:    Session focused on BUE strengthening to increase independence with ADL transfers and IADL tasks. Pt transitioned to EOB with (S). Discussed limb wrapping and ace wrap vs. shrinker with pt, pt asking appropriate questions. Pt held 3 lb dumbbell and completed tricep extension, bicep curl, and chest press 2x 12 repetitions. Using blue theraband pt completed lat pull downs and scapular add/abd. Constant demo/guidance provided. Pt returned to supine and bed alarm set.   Therapy Documentation Precautions:  Precautions Precautions: Fall Restrictions Weight Bearing Restrictions: Yes LLE Weight Bearing: Non weight bearing   Therapy/Group: Individual Therapy  Curtis Sites 10/31/2018, 4:40 PM

## 2018-10-31 NOTE — Progress Notes (Signed)
Physical Therapy Discharge Summary  Patient Details  Name: Daniel Campos MRN: [id number] Date of Birth: Aug 06, 1955  Today's Date: 10/31/2018 PT Individual Time: 0922-1041 PT Individual Time Calculation (min): 79 min    Patient has met 7 of 9 long term goals due to improved activity tolerance, improved balance, increased strength, decreased pain, ability to compensate for deficits and improved awareness.  Patient to discharge at a wheelchair level Modified Independent.   Therapist spoke with patient's care partner (son, Jenny Reichmann) and he reports he is independent to provide the necessary physical assistance at discharge for stair navigation.  Reasons goals not met: Patient continues to require CGA for dynamic standing balance and sit<>stand transfers for steadying/safety.   Recommendation:  Patient will benefit from ongoing skilled PT services in home health setting to continue to advance safe functional mobility, address ongoing impairments in B LE strength, standing balance, gait training, stair navigation, limb loss education, and pre-prosthetic training, as well as minimize fall risk.  Equipment: No equipment provided - patient reports having all necessary equipment  Reasons for discharge: treatment goals met and discharge from hospital  Patient/family agrees with progress made and goals achieved: Yes  Skilled Therapeutic Interventions/Progress Updates:  Patient received sitting in w/c and agreeable to therapy session despite reporting pain level of 7/10 - RN notified and present for medication administration. Performed B UE w/c mobility ~270f mod-I with pt demonstrating proper use of brakes and armrests without cuing. Patient reports he has 1 step followed by a step up onto his porch at home without handrails (just the railing of the deck at the top) - patient reports his son, JJenny Reichmann has experience bumping him up/down the steps in w/c - therapist educated patient on need to perform stair  navigation via family bumping him up/down in w/c at home for increased safety due to no handrails. Patient requires encouragement throughout session to continue participating with therapy. Patient set-up w/c for squat pivot transfer w/c>EOM without cuing and performed transfer with close supervision for safety. Patient performed bed mobility on mat table independently.  Performed the patient's printed HEP to review proper technique/form of exercises, x10 repetitions on L LE: - supine SLR  - R LE bridging with pt demonstrating minimal ability to lift hips from mat - max cuing with visual demonstration on proper technique - supine hip abduction   - supine hip adductor squeeze with pt reporting onset of L LE phantom foot burning pain   - R sidelying hip abduction with pt reporting continued onset of  L LE phantom foot burning pain that increases with increased ROM - cuing for staying in pain free ROM - prone lying for ~3 minutes with education on using pillows to prop under chest for increased comfort - prone L LE hip extension Cuing throughout for proper technique/form and educated on modifying exercises to decrease pain as well as education on stopping the exercises if pain increases.   Squat pivot transfer EOM>w/c mod-I. Sit<>stand w/c<>RW with CGA. Ambulated ~577fusing RW with CGA for steadying - pt demonstrates decreased R LE foot clearance with increased fatigue - cuing for increased attention to this for safety. Performed B UE w/c propulsion ~[id number]. Therapist spoke with patient's son, JohJenny Reichmannn phone regarding pt's current functional mobility level and mobility recommendations at home including: need to bump pt up/down stairs in w/c, squat pivot car transfer to their lower height vehicle, pt performing w/c mobility mod-I but needing hands-on physical assistance for standing/ambulation - pt's son reports  no concerns/quesions and confirms having prior experience bumping pt up/down stairs in the  w/c. Pt left sitting in w/c with needs in reach and seat belt alarm on.   PT Discharge Precautions/Restrictions Precautions Precautions: Fall Restrictions Weight Bearing Restrictions: Yes LLE Weight Bearing: Non weight bearing Pain Pain Assessment Pain Scale: 0-10 Pain Score: 7  Pain Type: Surgical pain;Acute pain Pain Location: Leg Pain Orientation: Left Pain Descriptors / Indicators: Throbbing Pain Onset: On-going Pain Intervention(s): Rest(therapy to tolerance - pt denies intervention stating only thing that helps is "laying back and giving it a break") Vision/Perception  Perception Perception: Within Functional Limits Praxis Praxis: Intact  Cognition Overall Cognitive Status: Within Functional Limits for tasks assessed Arousal/Alertness: Awake/alert Orientation Level: Oriented X4 Attention: Focused;Sustained Focused Attention: Appears intact Sustained Attention: Appears intact Memory: Appears intact Awareness: Impaired(pt reports he can just step up the stairs like he did prior to the amputation with lack of awareness on how this has affected his mobility) Problem Solving: Impaired(pt unable to problem solve how he can step up/down step without UE support until therapist demonstrated that it is not possible to do safely) Safety/Judgment: Impaired(pt wanting to attempted stair navigation without UE support) Sensation Sensation Light Touch: Impaired by gross assessment Hot/Cold: Not tested Proprioception: Impaired by gross assessment Stereognosis: Not tested Coordination Gross Motor Movements are Fluid and Coordinated: Yes Motor  Motor Motor: Other (comment) Motor - Discharge Observations: generalized weakness  Mobility Bed Mobility Bed Mobility: Rolling Right;Rolling Left;Sit to Supine;Supine to Sit Rolling Right: Independent Rolling Left: Independent Supine to Sit: Independent Sit to Supine: Independent Transfers Transfers: Sit to Stand;Stand to Sit;Squat  Pivot Transfers Sit to Stand: Contact Guard/Touching assist Stand to Sit: Contact Guard/Touching assist Squat Pivot Transfers: Independent with assistive device Transfer (Assistive device): Rolling walker(RW for sit<>stand) Locomotion  Gait Ambulation: Yes Gait Assistance: Contact Guard/Touching assist Gait Distance (Feet): 50 Feet Assistive device: Rolling walker Gait Gait: Yes Gait Pattern: Impaired Gait Pattern: Poor foot clearance - right(hop-to pattern with gradually decreased foot clearance with increased fatigue) Gait velocity: decreased Stairs / Additional Locomotion Stairs: No Wheelchair Mobility Wheelchair Mobility: Yes Wheelchair Assistance: Independent with Camera operator: Both upper extremities Wheelchair Parts Management: Independent Distance: 226f  Trunk/Postural Assessment  Cervical Assessment Cervical Assessment: Within Functional Limits Thoracic Assessment Thoracic Assessment: Within Functional Limits Lumbar Assessment Lumbar Assessment: Within Functional Limits Postural Control Postural Control: Deficits on evaluation Righting Reactions: delayed  Balance Balance Balance Assessed: Yes Static Sitting Balance Static Sitting - Balance Support: No upper extremity supported Static Sitting - Level of Assistance: 7: Independent Dynamic Sitting Balance Dynamic Sitting - Balance Support: During functional activity Dynamic Sitting - Level of Assistance: 7: Independent Static Standing Balance Static Standing - Balance Support: During functional activity;Bilateral upper extremity supported Static Standing - Level of Assistance: 4: Min assist Dynamic Standing Balance Dynamic Standing - Balance Support: During functional activity;Bilateral upper extremity supported Dynamic Standing - Level of Assistance: 4: Min assist Extremity Assessment  RLE Assessment RLE Assessment: Within Functional Limits LLE Assessment LLE Assessment: Within  Functional Limits    CTawana Scale PT ,DPT 10/31/2018, 9:29 AM

## 2018-10-31 NOTE — Progress Notes (Signed)
Physical Therapy Session Note  Patient Details  Name: Daniel Campos MRN: [id number] Date of Birth: 1955/12/26  Today's Date: 10/31/2018 PT Individual Time: 1330-1430 PT Individual Time Calculation (min): 60 min   Short Term Goals: Week 1:  PT Short Term Goal 1 (Week 1): STG=LTG due to ELOS  Skilled Therapeutic Interventions/Progress Updates:     Patient in bed upon PT arrival. Patient alert and agreeable to PT session.  Therapeutic Activity: Bed Mobility: Patient performed supine to/from sit independently with bed flat and without use of bed rails. Patient sat EOB and wrapped his residual limb with 2 4" ACE wraps with min cues from PT for increased pressure at the bottom and less pressure at the top and avoiding circumferential wrapping for reduced risk ob occluding blood flow. Transfers: Patient performed side scoot transfers independently, setting up the w/c for the transfer, removing the arm rest, and performing the transfer without cues from therapist.Educated on performing side scoot transfers only at home, except with therapy for safety. He performed car transfers x2 at 19.5" to simulate a standard height sedan and at 28" to simulate a small size truck. He performed both transfers with supervision performing a side scoot into the car. Educated on having someone with him for safety.  Wheelchair Mobility:  Patient propelled wheelchair >500 feet independently and was independent with management of leg rest and breaks throughout session. Reported that his son has been bumping him up his shallow steps at home for a long time and that he felt confident in his ability to continue doing this at d/c.  Patient in bed at end of session with breaks locked, bed alarm set, and all needs within reach. Educated on fall risk/prevention at home and activating emergency response in the event of a fall.    Therapy Documentation Precautions:  Precautions Precautions: Fall Restrictions Weight  Bearing Restrictions: Yes LLE Weight Bearing: Non weight bearing Vital Signs: Therapy Vitals Temp: 97.6 F (36.4 C) Temp Source: Oral Pulse Rate: 97 Resp: 20 BP: 124/76 Patient Position (if appropriate): Sitting Oxygen Therapy SpO2: 100 % O2 Device: Room Air Pain: Patient reported 6/10 pain in his residual limb that was constant. PT provided repositioning, distraction, and relaxation techniques throughout session for pain interventions.     Therapy/Group: Individual Therapy  Stormee Duda L [place] W[place]lick PT, DPT  10/31/2018, 4:46 PM

## 2018-10-31 NOTE — Progress Notes (Addendum)
[facility] PHYSICAL MEDICINE & REHABILITATION PROGRESS NOTE   Subjective/Complaints: Patient seen laying in bed this morning.  He states he slept well overnight.  He states he is looking for discharge tomorrow.  ROS: Denies CP, SOB, N/V/D  Objective:   No results found. Recent Labs    10/30/18 0622  WBC 10.6*  HGB 8.1*  HCT 25.6*  PLT 727*   Recent Labs    10/30/18 0622  NA 137  K 4.5  CL 102  CO2 27  GLUCOSE 59*  BUN 16  CREATININE 0.82  CALCIUM 8.5*    Intake/Output Summary (Last 24 hours) at 10/31/2018 1005 Last data filed at 10/31/2018 0717 Gross per 24 hour  Intake -  Output 1825 ml  Net -1825 ml     Physical Exam: Vital Signs Blood pressure 128/83, pulse (!) 101, temperature 97.7 F (36.5 C), temperature source Oral, resp. rate 16, height 5\' 11"  (1.803 m), weight 96.1 kg, SpO2 94 %. Constitutional: No distress . Vital signs reviewed. HENT: Normocephalic.  Atraumatic. Eyes: EOMI.  No discharge. Cardiovascular: No JVD. Respiratory: Normal effort. GI: Non-distended. Musc: Left stump edema and tenderness. Neurological: Alert and oriented Motor: Bilateral upper extremities: 5/5 proximal distal Right lower extremity: 4+/5 proximal distal Left lower extremity: Hip flexion 4+/5 Sensation intact light touch Skin: Left AKA with dressing C/D/I Right posterior distal leg wound with dressing C/D/ Psych: Normal mood.  Normal affect.    Assessment/Plan: 1. Functional deficits secondary to left AKA which require 3+ hours per day of interdisciplinary therapy in a comprehensive inpatient rehab setting.  Physiatrist is providing close team supervision and 24 hour management of active medical problems listed below.  Physiatrist and rehab team continue to assess barriers to discharge/monitor patient progress toward functional and medical goals  Care Tool:  Bathing  Bathing activity did not occur: Refused Body parts bathed by patient: Right arm, Left arm,  Chest, Abdomen, Front perineal area, Right upper leg, Left upper leg, Right lower leg, Face   Body parts bathed by helper: Buttocks Body parts n/a: Left lower leg   Bathing assist Assist Level: Minimal Assistance - Patient > 75%     Upper Body Dressing/Undressing Upper body dressing Upper body dressing/undressing activity did not occur (including orthotics): Refused What is the patient wearing?: Pull over shirt    Upper body assist Assist Level: Set up assist    Lower Body Dressing/Undressing Lower body dressing    Lower body dressing activity did not occur: Refused What is the patient wearing?: Pants     Lower body assist Assist for lower body dressing: Moderate Assistance - Patient 50 - 74%     Toileting Toileting Toileting Activity did not occur Landscape architect and hygiene only): Refused  Toileting assist Assist for toileting: Contact Guard/Touching assist     Transfers Chair/bed transfer  Transfers assist  Chair/bed transfer activity did not occur: Refused  Chair/bed transfer assist level: Supervision/Verbal cueing     Locomotion Ambulation   Ambulation assist   Ambulation activity did not occur: Safety/medical concerns  Assist level: Supervision/Verbal cueing Assistive device: Walker-rolling Max distance: 10'   Walk 10 feet activity   Assist  Walk 10 feet activity did not occur: Safety/medical concerns  Assist level: Contact Guard/Touching assist Assistive device: Walker-rolling   Walk 50 feet activity   Assist Walk 50 feet with 2 turns activity did not occur: Safety/medical concerns         Walk 150 feet activity   Assist Walk 150 feet  activity did not occur: Safety/medical concerns         Walk 10 feet on uneven surface  activity   Assist Walk 10 feet on uneven surfaces activity did not occur: Safety/medical concerns         Wheelchair     Assist Will patient use wheelchair at discharge?: Yes Type of Wheelchair:  Manual    Wheelchair assist level: Supervision/Verbal cueing Max wheelchair distance: 150 ft    Wheelchair 50 feet with 2 turns activity    Assist        Assist Level: Supervision/Verbal cueing   Wheelchair 150 feet activity     Assist     Assist Level: Supervision/Verbal cueing     Medical Problem List and Plan: 1. 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 Antithrombotics: -DVT/anticoagulation:Pharmaceutical:Lovenox -antiplatelet therapy: ASA 81mg  daily 3. Pain Management:On Lyrica tid.  Oxycodone prn.  Encouraged local measures to stop for pain relief 4. Mood:team to provide ego support as needed  -neuropsych eval -antipsychotic agents: NA 5. Neuropsych: This patientiscapable of making decisions on hisown behalf. 6. Skin/Wound Care: Added proteins supplements for malnutrition and to promote wound healing.  -local care and ACE to stump  Increased stent ~twice daily for right lower extremity 7. Fluids/Electrolytes/Nutrition:Monitor I/Os 8. 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 Donnetta Hutching in 4 weeks for staple removal  9. Acute on chronic anemia/H/o GIB:   Hemoglobin 8.1 on 6/15  Continue to monitor 10: HTN: Monitor BP tid  Relatively controlled on 6/15  Heart rate slightly elevated, continue to monitor 11. T2DM with peripheral neuropathy: Hgb A1C-7.7.  Lantus DC'd on 6/15, restarted 6 units on 6/16  NovoLog 5 units 3 times daily  Extremely labile with hyperglycemia this a.m.  Will require ambulatory monitoring with further adjustments 12. Leucocytosis:   WBCs 10.6 on 6/15  Afebrile  Monitor for signs of infection/fevers.  13. Urinary retention:  UA equivocal, UCX insignificant growth  Flomax started on 6/15  Bethanechol ordered on  6/16  -continue bladder program/training with I/O cath if needed 14. Constipation:augmented  bowel program with results 15.  Hypomagnesemia  Magnesium 1.5 on 6/15  IV supplementation x1 day on 6/16,   Cont daily supplementation   LOS: 6 days A FACE TO FACE EVALUATION WAS PERFORMED  Vicktoria Muckey Lorie Phenix 10/31/2018, 10:05 AM

## 2018-10-31 NOTE — Progress Notes (Signed)
10/31/2018 2202: Pt performed self-cath with min assist from RN and NT. Please refer to Flowsheet Intake and Output. Pt educated by Therapist, sports on proper self-cath technique and hygiene practices. Pt shows eagerness to learn. RN will continue to educate.

## 2018-11-01 LAB — GLUCOSE, CAPILLARY: Glucose-Capillary: 245 mg/dL — ABNORMAL HIGH (ref 70–99)

## 2018-11-01 MED ORDER — JUVEN PO PACK: 1.0000 | PACK | Freq: Two times a day (BID) | ORAL | 0 refills | Status: AC

## 2018-11-01 MED ORDER — POLYETHYLENE GLYCOL 3350 17 G PO PACK: 17.0000 g | PACK | Freq: Two times a day (BID) | ORAL | 0 refills | Status: AC

## 2018-11-01 MED ORDER — OXYCODONE-ACETAMINOPHEN 5-325 MG PO TABS: 1.0000 | ORAL_TABLET | Freq: Three times a day (TID) | ORAL | 0 refills | Status: DC | PRN

## 2018-11-01 MED ORDER — PREGABALIN 100 MG PO CAPS: 100.0000 mg | ORAL_CAPSULE | Freq: Three times a day (TID) | ORAL | 1 refills | Status: AC

## 2018-11-01 MED ORDER — BETHANECHOL CHLORIDE 25 MG PO TABS: 25.0000 mg | ORAL_TABLET | Freq: Three times a day (TID) | ORAL | 1 refills | Status: AC

## 2018-11-01 MED ORDER — SENNOSIDES-DOCUSATE SODIUM 8.6-50 MG PO TABS: 2.0000 | ORAL_TABLET | Freq: Every day | ORAL | 1 refills | Status: AC

## 2018-11-01 MED ORDER — MAGNESIUM OXIDE 400 (241.3 MG) MG PO TABS: 400.0000 mg | ORAL_TABLET | Freq: Two times a day (BID) | ORAL | 1 refills | Status: AC

## 2018-11-01 MED ORDER — TRAMADOL HCL 50 MG PO TABS: 50.0000 mg | ORAL_TABLET | Freq: Four times a day (QID) | ORAL | 0 refills | Status: AC | PRN

## 2018-11-01 MED ORDER — INSULIN GLARGINE 100 UNIT/ML ~~LOC~~ SOLN: 6.0000 [IU] | Freq: Every evening | SUBCUTANEOUS | 11 refills | Status: AC

## 2018-11-01 MED ORDER — NAPHAZOLINE-GLYCERIN 0.012-0.2 % OP SOLN: 1.0000 [drp] | Freq: Four times a day (QID) | OPHTHALMIC | 0 refills | Status: AC | PRN

## 2018-11-01 MED ORDER — METHOCARBAMOL 500 MG PO TABS: 500.0000 mg | ORAL_TABLET | Freq: Four times a day (QID) | ORAL | 1 refills | Status: AC | PRN

## 2018-11-01 MED ORDER — COLLAGENASE 250 UNIT/GM EX OINT: TOPICAL_OINTMENT | Freq: Two times a day (BID) | CUTANEOUS | 3 refills | Status: DC

## 2018-11-01 MED ORDER — TAMSULOSIN HCL 0.4 MG PO CAPS: 0.4000 mg | ORAL_CAPSULE | Freq: Every day | ORAL | 1 refills | Status: AC

## 2018-11-01 MED ORDER — ADULT MULTIVITAMIN W/MINERALS CH: 1.0000 | ORAL_TABLET | Freq: Every day | ORAL | Status: AC

## 2018-11-01 MED ORDER — ACETAMINOPHEN 325 MG PO TABS: 325.0000 mg | ORAL_TABLET | ORAL | Status: AC | PRN

## 2018-11-01 MED ORDER — OXYCODONE-ACETAMINOPHEN 5-325 MG PO TABS: 1.0000 | ORAL_TABLET | Freq: Three times a day (TID) | ORAL | 0 refills | Status: AC | PRN

## 2018-11-01 NOTE — Progress Notes (Signed)
Daniel Campos to be D/C'd per MD order. Discussed with the patient and all questions fully answered. ? VSS, Skin clean, dry and intact without evidence of skin break down, no evidence of skin tears noted. ? IV catheter discontinued intact. Site without signs and symptoms of complications. Dressing and pressure applied. ? An After Visit Summary was printed and given to the patient. Patient informed where to pickup prescriptions. ? D/c education completed with patient/family including follow up instructions, medication list, d/c activities limitations if indicated, with other d/c instructions as indicated by MD - patient able to verbalize understanding, all questions fully answered.  ? Patient instructed to return to ED, call 911, or call MD for any changes in condition.  ? Patient to be escorted via [place], and D/C home via private auto.

## 2018-11-01 NOTE — Discharge Instructions (Signed)
Inpatient Rehab Discharge Instructions  [facility] Discharge date and time: 11/01/18   Activities/Precautions/ Functional Status: Activity: no lifting, driving, or strenuous exercise till cleared by MD Diet: diabetic diet Wound Care: To right ankle ulcer: Cleanse off area daily. Apply a layer of santyl and cover with damp to dry dressing. Change twice a day.                        To left AKA incision: wash with soap and water. Pat dry and apply dry dressing/cover with sock.   Functional status:  ___ No restrictions     ___ Walk up steps independently _X__ 24/7 supervision/assistance   ___ Walk up steps with assistance ___ Intermittent supervision/assistance  ___ Bathe/dress independently ___ Walk with walker     __X_ Bathe/dress with assistance ___ Walk Independently    ___ Shower independently ___ Walk with assistance    ___ Shower with assistance _X__ No alcohol     ___ Return to work/school ________   Special Instructions: 1. Monitor blood sugars before meals and at bedtime. Use 5 units Novolog with meals if blood sugars above 80 and you eat more than 50% of meal.  2. Resume Levemir or Glargine (your long acting insulin) --6 units at bedtime.  3. Keep a record of fluid intake and output. You need to cath yourself every 4-5 times a day to keep volumes less than 400 cc.    COMMUNITY REFERRALS UPON DISCHARGE:   Home Health:   PT     OT     RN     Agency:  [facility] Phone:  [phone]  Medical Equipment/Items Ordered:  You have all recommended DME.   GENERAL COMMUNITY RESOURCES FOR PATIENT/FAMILY: Support Groups:  [facility] - on hold for COVID-19 right now                              Meets the second Thursday of each month from 7-8:30 pm                              [facility] [address] [place] [address]                              In the [facility] conference room  For more information or peer-to-peer support, contact Jamey Reas, PT, DPT  [phone]  My questions have been answered and I understand these instructions. I will adhere to these goals and the provided educational materials after my discharge from the hospital.  Patient/Caregiver Signature _______________________________ Date __________  Clinician Signature _______________________________________ Date __________  Please bring this form and your medication list with you to all your follow-up doctor's appointments.

## 2018-11-02 DIAGNOSIS — E11622 Type 2 diabetes mellitus with other skin ulcer: Secondary | ICD-10-CM | POA: Diagnosis not present

## 2018-11-02 DIAGNOSIS — Z7982 Long term (current) use of aspirin: Secondary | ICD-10-CM | POA: Diagnosis not present

## 2018-11-02 DIAGNOSIS — L97212 Non-pressure chronic ulcer of right calf with fat layer exposed: Secondary | ICD-10-CM | POA: Diagnosis not present

## 2018-11-02 DIAGNOSIS — R338 Other retention of urine: Secondary | ICD-10-CM | POA: Diagnosis not present

## 2018-11-02 DIAGNOSIS — N401 Enlarged prostate with lower urinary tract symptoms: Secondary | ICD-10-CM | POA: Diagnosis not present

## 2018-11-02 DIAGNOSIS — D649 Anemia, unspecified: Secondary | ICD-10-CM | POA: Diagnosis not present

## 2018-11-02 DIAGNOSIS — Z960 Presence of urogenital implants: Secondary | ICD-10-CM | POA: Diagnosis not present

## 2018-11-02 DIAGNOSIS — Z4781 Encounter for orthopedic aftercare following surgical amputation: Secondary | ICD-10-CM | POA: Diagnosis not present

## 2018-11-02 DIAGNOSIS — Z89612 Acquired absence of left leg above knee: Secondary | ICD-10-CM | POA: Diagnosis not present

## 2018-11-02 DIAGNOSIS — Z794 Long term (current) use of insulin: Secondary | ICD-10-CM | POA: Diagnosis not present

## 2018-11-02 DIAGNOSIS — E1151 Type 2 diabetes mellitus with diabetic peripheral angiopathy without gangrene: Secondary | ICD-10-CM | POA: Diagnosis not present

## 2018-11-02 DIAGNOSIS — E1142 Type 2 diabetes mellitus with diabetic polyneuropathy: Secondary | ICD-10-CM | POA: Diagnosis not present

## 2018-11-02 DIAGNOSIS — E871 Hypo-osmolality and hyponatremia: Secondary | ICD-10-CM | POA: Diagnosis not present

## 2018-11-02 DIAGNOSIS — Z993 Dependence on wheelchair: Secondary | ICD-10-CM | POA: Diagnosis not present

## 2018-11-02 DIAGNOSIS — Z87891 Personal history of nicotine dependence: Secondary | ICD-10-CM | POA: Diagnosis not present

## 2018-11-02 DIAGNOSIS — I10 Essential (primary) hypertension: Secondary | ICD-10-CM | POA: Diagnosis not present

## 2018-11-02 DIAGNOSIS — K5900 Constipation, unspecified: Secondary | ICD-10-CM | POA: Diagnosis not present

## 2018-11-02 DIAGNOSIS — Z9181 History of falling: Secondary | ICD-10-CM | POA: Diagnosis not present

## 2018-11-02 NOTE — Progress Notes (Signed)
Social Work Discharge Note  The overall goal for the admission was met for:   Discharge location: Yes - home with family  Length of Stay: Yes - 7 days  Discharge activity level: Yes - modified independent from w/c  Home/community participation: Yes  Services provided included: MD, RD, PT, OT, RN, Pharmacy and SW  Financial Services: Medicare  Follow-up services arranged: Home Health: PT/OT/RN from [facility] and Patient/Family has no preference for HH/DME agencies  Pt has all recommended DME.  Comments (or additional information):  Therapists talked to pt's son and dtr and they feel prepared to care for pt at home.  Son has been bumping pt up steps in w/c.  Ramp to be installed at some point by [place].  Patient/Family verbalized understanding of follow-up arrangements: Yes  Individual responsible for coordination of the follow-up plan: pt's dtr, Sharyn Lull [phone]; son Jenny Reichmann [phone] and Loann Quill - friend [phone]   Confirmed correct DME delivered: Trey Sailors 11/02/2018    Lizandro Spellman, Silvestre Mesi

## 2018-11-02 NOTE — Patient Care Conference (Signed)
Inpatient RehabilitationTeam Conference and Plan of Care Update Date: 11/01/2018   Time: 1:50 PM    Patient Name: Daniel Campos      Medical Record Number: [id number]  Date of Birth: November 08, 1955 Sex: Male         Room/Bed: 5N17C/5N17C-01 Payor Info: Payor: MEDICARE / Plan: MEDICARE PART A AND B / Product Type: *No Product type* /    Admitting Diagnosis: 5. NT Team  Lt. Edwena Blow, PVD; 15-18 days  Admit Date/Time:  10/25/2018  4:20 PM Admission Comments: No comment available   Primary Diagnosis:  Status post above-knee amputation (HCC) Principal Problem: Status post above-knee amputation [facility])  Patient Active Problem List   Diagnosis Date Noted  . Hypomagnesemia   . Urinary retention   . Leukocytosis   . Essential hypertension   . Labile blood glucose   . Hypoglycemia   . Acute on chronic anemia   . Status post above-knee amputation ([place]) 10/25/2018  . Unilateral AKA, left ([place]) 10/25/2018  . Gangrene of left foot ([place]) 10/21/2018  . Melena   . Heme positive stool 02/23/2018  . Bacteremia due to Gram-positive bacteria 04/29/2017  . History of pulmonary embolus (PE) 04/29/2017  . Acute urinary retention 04/27/2017  . ARF (acute renal failure) ([place]) 04/26/2017  . Tachycardia 04/26/2017  . Acute anemia 04/26/2017  . Hyponatremia 04/26/2017  . Loose stools 08/31/2016  . Encounter for screening colonoscopy 04/29/2016  . Rotator cuff tear 02/24/2013  . Stiffness of joint, not elsewhere classified, other specified site 01/16/2013  . Weakness of right leg 01/16/2013  . Fracture of right shoulder 01/04/2013  . Osteopenia 01/02/2013  . Intertrochanteric fracture of right hip ([place]) 12/20/2012  . Shoulder fracture 12/20/2012  . Essential hypertension, benign 10/16/2012  . Diabetes mellitus with peripheral vascular disease ([place]) 08/16/2012  . Prostate hypertrophy 08/16/2012    Expected Discharge Date: Expected Discharge Date: 11/01/18  Team Members Present: Physician leading  conference: Dr. 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 Pt performed self cath. Continent of bowel.  Remain continent. Mod I assist with caths.  Montior bowel and bladder status.   Swallow/Nutrition/ Hydration             ADL's   on admission, pt needed min A.  Today he is mod I with BADLs and ready for d/c tomorrow.  Mod I with BADLs  pt education, balance training in standing   Mobility   mod I w/c, CGA standing balance  supervision  d/c planning   Communication             Safety/Cognition/ Behavioral Observations            Pain   Pt complains of pain to left leg. Percocet given q4hrs prn.  <3 out of 10.  Assess for pain and treat accordingly.   Skin   Incision to left lower extremity with staples intact. No drainage and Ace wrap in place. Wound to Right leg with santyl wet to dry dressing daily.  No new breakdown or infection  assess  skin q shift and prn. Dressing changes as ordered.    Rehab Goals Patient on target to meet rehab goals: Yes Rehab Goals Revised: none *See Care Plan and progress notes for long and short-term goals.     Barriers to Discharge  Current Status/Progress Possible Resolutions Date Resolved   Physician    Medical stability;Wound Care;Weight bearing restrictions     See above  Progressing and overall improving, plan for discharge today      Nursing                  PT                    OT                  SLP                SW                Discharge Planning/Teaching  Needs:  Pt to return home where family will be with him to assist as needed.  Family has been assisting pt and no family education was needed.   Team Discussion:  Pt has met goals and is medically stable for d/c.  Revisions to Treatment Plan:  none    Continued Need for Acute Rehabilitation Level of Care: The patient requires daily medical management by a physician with specialized training in physical medicine and rehabilitation for the following conditions: Daily direction of a multidisciplinary physical rehabilitation program to ensure safe treatment while eliciting the highest outcome that is of practical value to the patient.: Yes Daily medical management of patient stability for increased activity during participation in an intensive rehabilitation regime.: Yes Daily analysis of laboratory values and/or radiology reports with any subsequent need for medication adjustment of medical intervention for : Post surgical problems;Diabetes problems;Urological problems;Blood pressure problems   I attest that I was present, lead the team conference, and concur with the assessment and plan of the team.Team conference was held via web/ teleconference due to [place] - 19.    Geovanni Rahming, Silvestre Mesi 11/02/2018, 3:34 PM

## 2018-11-02 NOTE — Progress Notes (Signed)
Social Work Patient ID: Daniel Campos, male   DOB: 03-12-1956, 63 y.o.   MRN: [id number]   CSW met with pt on 10-31-18 and talked with pt's friend, Jeannene Patella and dtr, Sharyn Lull about pt being ready for d/c on 11-01-18.  CSW talked with them about ramp, as they are working on [place] to build ramp.  CSW called local [employer] person helping them and he did not need anything from [place].  Pt's family talked with therapists via phone and family education was completed that way, no in person education necessary.  CSW remains available to assist as needed.

## 2018-11-03 ENCOUNTER — Telehealth: Payer: Self-pay

## 2018-11-03 NOTE — Telephone Encounter (Signed)
Daughter called and said that patient had AKA and went to rehab but just recently came home. She said that he came home with a DM cyst and she wanted to know if someone could come out and do wound care as well as show them how to wrap the amputated leg because no one has shown them  [place] with [facility] to initiate and notified daughter as well.   York Cerise, CMA

## 2018-11-05 DIAGNOSIS — S78112A Complete traumatic amputation at level between left hip and knee, initial encounter: Secondary | ICD-10-CM | POA: Diagnosis not present

## 2018-11-05 DIAGNOSIS — K921 Melena: Secondary | ICD-10-CM | POA: Diagnosis not present

## 2018-11-05 DIAGNOSIS — Z4781 Encounter for orthopedic aftercare following surgical amputation: Secondary | ICD-10-CM | POA: Diagnosis not present

## 2018-11-05 DIAGNOSIS — Z794 Long term (current) use of insulin: Secondary | ICD-10-CM | POA: Diagnosis not present

## 2018-11-05 DIAGNOSIS — E1151 Type 2 diabetes mellitus with diabetic peripheral angiopathy without gangrene: Secondary | ICD-10-CM | POA: Diagnosis not present

## 2018-11-05 DIAGNOSIS — I2699 Other pulmonary embolism without acute cor pulmonale: Secondary | ICD-10-CM | POA: Diagnosis not present

## 2018-11-05 DIAGNOSIS — Z7982 Long term (current) use of aspirin: Secondary | ICD-10-CM | POA: Diagnosis not present

## 2018-11-05 DIAGNOSIS — D649 Anemia, unspecified: Secondary | ICD-10-CM | POA: Diagnosis not present

## 2018-11-05 DIAGNOSIS — Z87891 Personal history of nicotine dependence: Secondary | ICD-10-CM | POA: Diagnosis not present

## 2018-11-05 DIAGNOSIS — N400 Enlarged prostate without lower urinary tract symptoms: Secondary | ICD-10-CM | POA: Diagnosis not present

## 2018-11-05 DIAGNOSIS — R339 Retention of urine, unspecified: Secondary | ICD-10-CM | POA: Diagnosis not present

## 2018-11-05 DIAGNOSIS — I10 Essential (primary) hypertension: Secondary | ICD-10-CM | POA: Diagnosis not present

## 2018-11-05 DIAGNOSIS — I70262 Atherosclerosis of native arteries of extremities with gangrene, left leg: Secondary | ICD-10-CM | POA: Diagnosis not present

## 2018-11-05 DIAGNOSIS — Z89612 Acquired absence of left leg above knee: Secondary | ICD-10-CM | POA: Diagnosis not present

## 2018-11-05 DIAGNOSIS — Z9181 History of falling: Secondary | ICD-10-CM | POA: Diagnosis not present

## 2018-11-05 NOTE — Discharge Summary (Addendum)
Physician Discharge Summary  Patient ID: Daniel Campos MRN: [id number] DOB/AGE: 64-Mar-1957 64 y.o.  Admit date: 10/25/2018 Discharge date: 11/01/2018  Discharge Diagnoses:  Principal Problem:   Unilateral AKA, left (HCC) Active Problems:   Diabetes mellitus with peripheral vascular disease ([place])   History of pulmonary embolus (PE)   Urinary retention   Leukocytosis   Essential hypertension   Acute on chronic anemia   Discharged Condition:  Stable  Significant Diagnostic Studies: N/A   Labs:  Basic Metabolic Panel: BMP Latest Ref Rng & Units 10/30/2018 10/26/2018 10/25/2018  Glucose 70 - 99 mg/dL 59(L) 111(H) 74  BUN 8 - 23 mg/dL 16 12 17   Creatinine 0.61 - 1.24 mg/dL 0.82 0.77 0.79  Sodium 135 - 145 mmol/L 137 133(L) 133(L)  Potassium 3.5 - 5.1 mmol/L 4.5 4.4 4.1  Chloride 98 - 111 mmol/L 102 104 106  CO2 22 - 32 mmol/L 27 22 19(L)  Calcium 8.9 - 10.3 mg/dL 8.5(L) 8.1(L) 7.9(L)    CBC: CBC Latest Ref Rng & Units 10/30/2018 10/26/2018 10/25/2018  WBC 4.0 - 10.5 K/uL 10.6(H) 14.1(H) 13.1(H)  Hemoglobin 13.0 - 17.0 g/dL 8.1(L) 8.6(L) 7.7(L)  Hematocrit 39.0 - 52.0 % 25.6(L) 27.0(L) 24.2(L)  Platelets 150 - 400 K/uL 727(H) 747(H) 683(H)    CBG: Recent Labs  Lab 10/31/18 0707 10/31/18 1302 10/31/18 1708 10/31/18 2139 11/01/18 0622  GLUCAP 335* 252* 138* 122* 245*    Brief HPI:   Daniel Campos is a 63 year old male with history of T2DM, PE, HTN who was admitted on 10/21/18 with non healing ulcers LLE and ischemia, ABLA and leucocytosis. He was treated with broad spectrum, steroids and underwent L-AKA on 06/08 By Dr. Donzetta Matters. Post op has had problems with urinary retention with volumes up to 1000 cc therefore foley placed, acute on chronic anemia, hyponatremia and labile BS. Therapy evaluation completed and CIR was consulted due to functional deficits.    Hospital Course: HARNOOR KOHLES was admitted to rehab 10/25/2018 for inpatient therapies to consist of PT and OT  at least three hours five days a week. Past admission physiatrist, therapy team and rehab RN have worked together to provide customized collaborative inpatient rehab. His left AKA incision is healing well and is C/D/I. Ulcer on right shin has been treated with santyl and wet to dry dressing changes. Follow up CBC showed that ABLA and leucocytosis is resolving. Blood pressures have been reasonably. Hypo magnesemia was supplemented with IV dose with improvement and oral supplement added on 06/15. Po intake has improved and blood sugars were monitored on ac/hs basis.  Blood sugars have been labile with multiple changes during his stay.  Long acting insulin has been adjusted to 5 units in am and he is to continue meal coverage as per scale advised.  He continues to use oxycodone and ultram prn for pain management. Bowel program has been augmented to help manage constipation. Foley was discontinued past admission and he continued to have problems with urinary retention. Flomax was added without much improvement. Urecholine was added and has been titrated to 25 mg tid.  He continues to require I/O caths 4-5 times a day and is able to complete self cath's without difficulty.  He has made good gains during his rehab stay and he will continue to receive follow up [place] [place] [facility] after discharge.    Rehab course: During patient's stay in rehab weekly team conferences were held to monitor patient's progress,  set goals and discuss barriers to discharge. At admission, patient required min assist with ADLs and min assist with mobility Cognitive evaluation revealed mild cognitive deficits felt to be at baseline with MoCA score 25/30.  He has had improvement in activity tolerance, balance, postural control as well as ability to compensate for deficits. He is able to complete ADL tasks at modified independent level. He is modified independent for transfers and is able to ambulate 62' with CGA and  use of a walker.  He is able to propel his wheelchair for 200' at modified independent level. Family eduction completed over the phone with son.     Disposition: Home  Diet: Diabetic.   Special Instructions: 1. Continue I/O cath 4-5 times a day to keep bladder volumes < 350 cc. 2. Will need to follow up with primary care at [facility] for follow up on urinary retention.  3. Resume long acting insulin (levemir or lantus) at 6 units daily. Continue to monitor BS ac/hs  4. Will need to have CBC and Magnesium levels rechecked in 1-2 weeks to monitor for stability.   Discharge Instructions    Ambulatory referral to Physical Medicine Rehab   Complete by: As directed    1-2 weeks transitional care appt     Allergies as of 11/01/2018   No Known Allergies     Medication List    STOP taking these medications   bisacodyl 5 MG EC tablet Commonly known as: DULCOLAX   docusate sodium 100 MG capsule Commonly known as: COLACE   guaiFENesin-dextromethorphan 100-10 MG/5ML syrup Commonly known as: ROBITUSSIN DM   ondansetron 4 MG tablet Commonly known as: ZOFRAN     TAKE these medications   acetaminophen 325 MG tablet Commonly known as: TYLENOL Take 1-2 tablets (325-650 mg total) by mouth every 4 (four) hours as needed for mild pain.   aspirin 81 MG tablet Take 1 tablet (81 mg total) by mouth daily. Resume on 04/12/18   B-complex with vitamin C tablet Take 1 tablet by mouth daily.   bethanechol 25 MG tablet Commonly known as: URECHOLINE Take 1 tablet (25 mg total) by mouth 3 (three) times daily.   collagenase ointment Commonly known as: SANTYL Apply topically 2 (two) times daily. To right shin ulcer   insulin glargine 100 UNIT/ML injection Commonly known as: LANTUS Inject 0.06 mLs (6 Units total) into the skin every evening. Injects by personal sliding scale What changed: how much to take   magnesium oxide 400 (241.3 Mg) MG tablet Commonly known as: MAG-OX Take  1 tablet (400 mg total) by mouth 2 (two) times daily.   methocarbamol 500 MG tablet Commonly known as: ROBAXIN Take 1 tablet (500 mg total) by mouth every 6 (six) hours as needed for muscle spasms.   multivitamin with minerals Tabs tablet Take 1 tablet by mouth daily.   naphazoline-glycerin 0.012-0.2 % Soln Commonly known as: CLEAR EYES REDNESS Place 1-2 drops into both eyes 4 (four) times daily as needed for eye irritation.   NovoLOG FlexPen 100 UNIT/ML FlexPen Generic drug: insulin aspart Inject 5 Units into the skin 3 (three) times daily with meals.   nutrition supplement (JUVEN) Pack Take 1 packet by mouth 2 (two) times daily between meals.   omeprazole 40 MG capsule Commonly known as: PRILOSEC Take 40 mg by mouth every morning.   oxyCODONE-acetaminophen 5-325 MG tablet--Rx# 40 pills.  Commonly known as: PERCOCET/ROXICET Take 1-2 tablets by mouth every 8 (eight) hours as needed  for severe pain. What changed:   when to take this  reasons to take this   polyethylene glycol 17 g packet Commonly known as: MIRALAX / GLYCOLAX Take 17 g by mouth 2 (two) times daily. What changed: when to take this   pregabalin 100 MG capsule Commonly known as: LYRICA Take 1 capsule (100 mg total) by mouth 3 (three) times daily.   senna-docusate 8.6-50 MG tablet Commonly known as: Senokot-S Take 2 tablets by mouth at bedtime.   tamsulosin 0.4 MG Caps capsule Commonly known as: FLOMAX Take 1 capsule (0.4 mg total) by mouth daily after supper.   traMADol 50 MG tablet--Rx# 28 pills Commonly known as: ULTRAM Take 1 tablet (50 mg total) by mouth every 6 (six) hours as needed for moderate pain. Notes to patient: This is for moderate pain   VITAMIN D PO Take 1 tablet by mouth daily.      [facility] Follow up.   Why: Nurse from [place] will call you to schedule a post hospital follow up visit. Contact information: [address] [place]  [address] [phone]        Jamse Arn, MD Follow up.   Specialty: Physical Medicine and Rehabilitation Why: Office will call you with follow up appointment Contact information: [address] [place] [address] [phone]        Rosetta Posner, MD. Call.   Specialties: Vascular Surgery, Cardiology Why: for post op appointment Contact information: [place] [address] [phone]           Signed: Bary Leriche 11/06/2018, 12:34 AM Patient seen and examined by me on day of discharge. Delice Lesch, MD, ABPMR

## 2018-11-07 ENCOUNTER — Telehealth (HOSPITAL_COMMUNITY): Payer: Self-pay

## 2018-11-07 DIAGNOSIS — Z89612 Acquired absence of left leg above knee: Secondary | ICD-10-CM | POA: Diagnosis not present

## 2018-11-07 DIAGNOSIS — Z4781 Encounter for orthopedic aftercare following surgical amputation: Secondary | ICD-10-CM | POA: Diagnosis not present

## 2018-11-07 DIAGNOSIS — R339 Retention of urine, unspecified: Secondary | ICD-10-CM | POA: Diagnosis not present

## 2018-11-07 DIAGNOSIS — I10 Essential (primary) hypertension: Secondary | ICD-10-CM | POA: Diagnosis not present

## 2018-11-07 DIAGNOSIS — S78112A Complete traumatic amputation at level between left hip and knee, initial encounter: Secondary | ICD-10-CM | POA: Diagnosis not present

## 2018-11-07 DIAGNOSIS — E1151 Type 2 diabetes mellitus with diabetic peripheral angiopathy without gangrene: Secondary | ICD-10-CM | POA: Diagnosis not present

## 2018-11-07 NOTE — Telephone Encounter (Signed)
Follow up phone call post discharge home to check pt status. No answer. Left a voice message and call back number for pt to return call.

## 2018-11-09 ENCOUNTER — Other Ambulatory Visit: Payer: Self-pay | Admitting: Vascular Surgery

## 2018-11-09 DIAGNOSIS — Z4781 Encounter for orthopedic aftercare following surgical amputation: Secondary | ICD-10-CM | POA: Diagnosis not present

## 2018-11-09 DIAGNOSIS — E1151 Type 2 diabetes mellitus with diabetic peripheral angiopathy without gangrene: Secondary | ICD-10-CM | POA: Diagnosis not present

## 2018-11-09 DIAGNOSIS — R339 Retention of urine, unspecified: Secondary | ICD-10-CM | POA: Diagnosis not present

## 2018-11-09 DIAGNOSIS — S78112A Complete traumatic amputation at level between left hip and knee, initial encounter: Secondary | ICD-10-CM | POA: Diagnosis not present

## 2018-11-09 DIAGNOSIS — Z89612 Acquired absence of left leg above knee: Secondary | ICD-10-CM | POA: Diagnosis not present

## 2018-11-09 DIAGNOSIS — I10 Essential (primary) hypertension: Secondary | ICD-10-CM | POA: Diagnosis not present

## 2018-11-09 MED ORDER — COLLAGENASE 250 UNIT/GM EX OINT: TOPICAL_OINTMENT | Freq: Two times a day (BID) | CUTANEOUS | 3 refills | Status: AC

## 2018-11-10 NOTE — Progress Notes (Signed)
  Patient ID: Daniel Campos, male   DOB: Jan 20, 1956, 63 y.o.   MRN: [id number]   Due to Daniel Campos's permanent urinary retention, he will need to perform CIC 4x/day 1-2 months.

## 2018-11-12 DIAGNOSIS — I10 Essential (primary) hypertension: Secondary | ICD-10-CM | POA: Diagnosis not present

## 2018-11-12 DIAGNOSIS — Z4781 Encounter for orthopedic aftercare following surgical amputation: Secondary | ICD-10-CM | POA: Diagnosis not present

## 2018-11-12 DIAGNOSIS — Z89612 Acquired absence of left leg above knee: Secondary | ICD-10-CM | POA: Diagnosis not present

## 2018-11-12 DIAGNOSIS — R339 Retention of urine, unspecified: Secondary | ICD-10-CM | POA: Diagnosis not present

## 2018-11-12 DIAGNOSIS — S78112A Complete traumatic amputation at level between left hip and knee, initial encounter: Secondary | ICD-10-CM | POA: Diagnosis not present

## 2018-11-12 DIAGNOSIS — E1151 Type 2 diabetes mellitus with diabetic peripheral angiopathy without gangrene: Secondary | ICD-10-CM | POA: Diagnosis not present

## 2018-11-13 ENCOUNTER — Encounter: Payer: Medicare Other | Attending: Physical Medicine & Rehabilitation | Admitting: Physical Medicine & Rehabilitation

## 2018-11-14 DIAGNOSIS — S78112A Complete traumatic amputation at level between left hip and knee, initial encounter: Secondary | ICD-10-CM | POA: Diagnosis not present

## 2018-11-14 DIAGNOSIS — I10 Essential (primary) hypertension: Secondary | ICD-10-CM | POA: Diagnosis not present

## 2018-11-14 DIAGNOSIS — E1151 Type 2 diabetes mellitus with diabetic peripheral angiopathy without gangrene: Secondary | ICD-10-CM | POA: Diagnosis not present

## 2018-11-14 DIAGNOSIS — R339 Retention of urine, unspecified: Secondary | ICD-10-CM | POA: Diagnosis not present

## 2018-11-14 DIAGNOSIS — Z89612 Acquired absence of left leg above knee: Secondary | ICD-10-CM | POA: Diagnosis not present

## 2018-11-14 DIAGNOSIS — Z4781 Encounter for orthopedic aftercare following surgical amputation: Secondary | ICD-10-CM | POA: Diagnosis not present

## 2018-11-16 DIAGNOSIS — Z89612 Acquired absence of left leg above knee: Secondary | ICD-10-CM | POA: Diagnosis not present

## 2018-11-16 DIAGNOSIS — S78112A Complete traumatic amputation at level between left hip and knee, initial encounter: Secondary | ICD-10-CM | POA: Diagnosis not present

## 2018-11-16 DIAGNOSIS — Z4781 Encounter for orthopedic aftercare following surgical amputation: Secondary | ICD-10-CM | POA: Diagnosis not present

## 2018-11-16 DIAGNOSIS — E1151 Type 2 diabetes mellitus with diabetic peripheral angiopathy without gangrene: Secondary | ICD-10-CM | POA: Diagnosis not present

## 2018-11-16 DIAGNOSIS — I10 Essential (primary) hypertension: Secondary | ICD-10-CM | POA: Diagnosis not present

## 2018-11-16 DIAGNOSIS — R339 Retention of urine, unspecified: Secondary | ICD-10-CM | POA: Diagnosis not present

## 2018-11-20 ENCOUNTER — Telehealth (HOSPITAL_COMMUNITY): Payer: Self-pay | Admitting: Rehabilitation

## 2018-11-20 NOTE — Telephone Encounter (Signed)

## 2018-11-21 ENCOUNTER — Other Ambulatory Visit: Payer: Self-pay

## 2018-11-21 ENCOUNTER — Encounter: Payer: Self-pay | Admitting: Vascular Surgery

## 2018-11-21 ENCOUNTER — Ambulatory Visit (INDEPENDENT_AMBULATORY_CARE_PROVIDER_SITE_OTHER): Payer: Self-pay | Admitting: Vascular Surgery

## 2018-11-21 VITALS — BP 132/88 | HR 97 | Temp 97.3°F | Resp 16 | Ht 71.0 in | Wt 160.0 lb

## 2018-11-21 DIAGNOSIS — Z89612 Acquired absence of left leg above knee: Secondary | ICD-10-CM

## 2018-11-21 NOTE — Progress Notes (Signed)
Patient name: Daniel Campos MRN: [id number] DOB: [birth year] Sex: male  REASON FOR VISIT: Follow up above-knee amputation 10/23/2018  HPI: Daniel Campos is a 63 y.o. male here for follow-up.  He had presented with unreconstructable ischemia left foot.  Underwent left above-knee amputation on 10/22/2016.  Did well in the hospital and spent 1 week in inpatient rehab.  Current Outpatient Medications  Medication Sig Dispense Refill  . acetaminophen (TYLENOL) 325 MG tablet Take 1-2 tablets (325-650 mg total) by mouth every 4 (four) hours as needed for mild pain.    Marland Kitchen aspirin 81 MG tablet Take 1 tablet (81 mg total) by mouth daily. Resume on 04/12/18 30 tablet   . B Complex-C (B-COMPLEX WITH VITAMIN C) tablet Take 1 tablet by mouth daily.    . bethanechol (URECHOLINE) 25 MG tablet Take 1 tablet (25 mg total) by mouth 3 (three) times daily. 90 tablet 1  . Cholecalciferol (VITAMIN D PO) Take 1 tablet by mouth daily.    . collagenase (SANTYL) ointment Apply topically 2 (two) times daily. To right shin ulcer 15 g 3  . insulin glargine (LANTUS) 100 UNIT/ML injection Inject 0.06 mLs (6 Units total) into the skin every evening. Injects by personal sliding scale 10 mL 11  . magnesium oxide (MAG-OX) 400 (241.3 Mg) MG tablet Take 1 tablet (400 mg total) by mouth 2 (two) times daily. 60 tablet 1  . methocarbamol (ROBAXIN) 500 MG tablet Take 1 tablet (500 mg total) by mouth every 6 (six) hours as needed for muscle spasms. 60 tablet 1  . Multiple Vitamin (MULTIVITAMIN WITH MINERALS) TABS tablet Take 1 tablet by mouth daily.    . naphazoline-glycerin (CLEAR EYES REDNESS) 0.012-0.2 % SOLN Place 1-2 drops into both eyes 4 (four) times daily as needed for eye irritation.  0  . NOVOLOG FLEXPEN 100 UNIT/ML FlexPen Inject 5 Units into the skin 3 (three) times daily with meals.     . nutrition supplement, JUVEN, (JUVEN) PACK Take 1 packet by mouth 2 (two) times daily between meals.   0  . omeprazole (PRILOSEC) 40 MG capsule Take 40 mg by mouth every morning.    Marland Kitchen oxyCODONE-acetaminophen (PERCOCET/ROXICET) 5-325 MG tablet Take 1-2 tablets by mouth every 8 (eight) hours as needed for severe pain. 40 tablet 0  . polyethylene glycol (MIRALAX / GLYCOLAX) 17 g packet Take 17 g by mouth 2 (two) times daily. 60 each 0  . pregabalin (LYRICA) 100 MG capsule Take 1 capsule (100 mg total) by mouth 3 (three) times daily. 90 capsule 1  . senna-docusate (SENOKOT-S) 8.6-50 MG tablet Take 2 tablets by mouth at bedtime. 30 tablet 1  . tamsulosin (FLOMAX) 0.4 MG CAPS capsule Take 1 capsule (0.4 mg total) by mouth daily after supper. 30 capsule 1  . traMADol (ULTRAM) 50 MG tablet Take 1 tablet (50 mg total) by mouth every 6 (six) hours as needed for moderate pain. 28 tablet 0   No current facility-administered medications for this visit.      PHYSICAL EXAM: Vitals:   11/21/18 0812  BP: 132/88  Pulse: 97  Resp: 16  Temp: (!) 97.3 F (36.3 C)  TempSrc: Temporal  SpO2: 100%  Weight: 160 lb (72.6 kg)  Height: 5\' 11"  (1.803 m)    GENERAL: The patient is a well-nourished male, in no acute distress. The vital signs are documented above. Amputation is well-healed.  Mild erythema at the suture line.  MEDICAL ISSUES: For staple removal today.  Is continuing  [facility]  Will see [place] again on an as-needed basis   Rosetta Posner, MD [facility] Tel [phone] Pager [phone]

## 2018-11-24 DIAGNOSIS — Z89612 Acquired absence of left leg above knee: Secondary | ICD-10-CM | POA: Diagnosis not present

## 2018-11-24 DIAGNOSIS — R339 Retention of urine, unspecified: Secondary | ICD-10-CM | POA: Diagnosis not present

## 2018-11-24 DIAGNOSIS — S78112A Complete traumatic amputation at level between left hip and knee, initial encounter: Secondary | ICD-10-CM | POA: Diagnosis not present

## 2018-11-24 DIAGNOSIS — Z4781 Encounter for orthopedic aftercare following surgical amputation: Secondary | ICD-10-CM | POA: Diagnosis not present

## 2018-11-24 DIAGNOSIS — I10 Essential (primary) hypertension: Secondary | ICD-10-CM | POA: Diagnosis not present

## 2018-11-24 DIAGNOSIS — E1151 Type 2 diabetes mellitus with diabetic peripheral angiopathy without gangrene: Secondary | ICD-10-CM | POA: Diagnosis not present

## 2018-12-01 DIAGNOSIS — E1151 Type 2 diabetes mellitus with diabetic peripheral angiopathy without gangrene: Secondary | ICD-10-CM | POA: Diagnosis not present

## 2018-12-01 DIAGNOSIS — R339 Retention of urine, unspecified: Secondary | ICD-10-CM | POA: Diagnosis not present

## 2018-12-01 DIAGNOSIS — I10 Essential (primary) hypertension: Secondary | ICD-10-CM | POA: Diagnosis not present

## 2018-12-01 DIAGNOSIS — Z89612 Acquired absence of left leg above knee: Secondary | ICD-10-CM | POA: Diagnosis not present

## 2018-12-01 DIAGNOSIS — S78112A Complete traumatic amputation at level between left hip and knee, initial encounter: Secondary | ICD-10-CM | POA: Diagnosis not present

## 2018-12-01 DIAGNOSIS — Z4781 Encounter for orthopedic aftercare following surgical amputation: Secondary | ICD-10-CM | POA: Diagnosis not present

## 2019-02-21 ENCOUNTER — Other Ambulatory Visit: Payer: Self-pay

## 2019-02-21 ENCOUNTER — Ambulatory Visit (INDEPENDENT_AMBULATORY_CARE_PROVIDER_SITE_OTHER): Payer: No Typology Code available for payment source | Admitting: Podiatry

## 2019-02-21 VITALS — Temp 98.1°F

## 2019-02-21 DIAGNOSIS — L97912 Non-pressure chronic ulcer of unspecified part of right lower leg with fat layer exposed: Secondary | ICD-10-CM

## 2019-02-21 DIAGNOSIS — S78112A Complete traumatic amputation at level between left hip and knee, initial encounter: Secondary | ICD-10-CM

## 2019-02-25 NOTE — Progress Notes (Signed)
   Subjective:  63 y.o. male with PMHx of diabetes mellitus presenting today as a new patient with a chief complaint of an ulceration of the right posterior leg that has been present for the past two months. He states the wound is improving but he still has some intermittent soreness. He has been applying Betadine daily with a dry sterile dressing. He denies any worsening factors. Patient is here for further evaluation and treatment.    Past Medical History:  Diagnosis Date  . Diabetes mellitus without complication ([place])   . Hypertension   . Prostate hypertrophy   . Pulmonary emboli ([place]) 2018      Objective/Physical Exam General: The patient is alert and oriented x3 in no acute distress.  Dermatology:  Wound #1 noted to the right posterior leg measuring 2.0 x 2.0 x 0.1 cm (LxWxD).   To the noted ulceration(s), there is no eschar. There is a moderate amount of slough, fibrin, and necrotic tissue noted. Granulation tissue and wound base is red. There is a minimal amount of serosanguineous drainage noted. There is no exposed bone muscle-tendon ligament or joint. There is no malodor. Periwound integrity is intact. Skin is warm, dry and supple right lower extremities.  Vascular: Palpable pedal pulses RLE. No edema or erythema noted. Capillary refill within normal limits.  Neurological: Epicritic and protective threshold diminished RLE.   Musculoskeletal Exam: Above the knee amputation noted of the LLE. Range of motion within normal limits to all pedal and ankle joints of the right lower extremity. Muscle strength 5/5 in all groups right.   Assessment: 1. Ulceration right posterior leg - improving 2. S/p AKA LLE secondary to PAD DOS: 11/2018   Plan of Care:  1. Patient was evaluated. 2. medically necessary excisional debridement including subcutaneous tissue was performed using a tissue nipper and a chisel blade. Excisional debridement of all the necrotic nonviable tissue down to  healthy bleeding viable tissue was performed with post-debridement measurements same as pre-. 3. the wound was cleansed and dry sterile dressing applied. 4. Continue using Betadine daily with a dry sterile dressing.  5. patient is to return to clinic in 3 weeks.   Edrick Kins, DPM [facility]  Dr. Edrick Kins, [place]                                        [place], [place] [address]                Office [phone]  Fax [phone]

## 2019-03-14 ENCOUNTER — Ambulatory Visit: Payer: Non-veteran care | Admitting: Podiatry

## 2019-08-02 DIAGNOSIS — Z23 Encounter for immunization: Secondary | ICD-10-CM | POA: Diagnosis not present

## 2019-08-30 DIAGNOSIS — Z23 Encounter for immunization: Secondary | ICD-10-CM | POA: Diagnosis not present

## 2020-03-19 ENCOUNTER — Other Ambulatory Visit: Payer: Self-pay

## 2020-03-19 ENCOUNTER — Emergency Department (HOSPITAL_COMMUNITY)
Admission: EM | Admit: 2020-03-19 | Discharge: 2020-03-19 | Disposition: A | Discharge status: Home / Self Care | Payer: No Typology Code available for payment source | Attending: Emergency Medicine | Admitting: Emergency Medicine

## 2020-03-19 ENCOUNTER — Encounter (HOSPITAL_COMMUNITY): Payer: Self-pay

## 2020-03-19 DIAGNOSIS — R9431 Abnormal electrocardiogram [ECG] [EKG]: Secondary | ICD-10-CM | POA: Diagnosis present

## 2020-03-19 DIAGNOSIS — I10 Essential (primary) hypertension: Secondary | ICD-10-CM | POA: Insufficient documentation

## 2020-03-19 DIAGNOSIS — E119 Type 2 diabetes mellitus without complications: Secondary | ICD-10-CM | POA: Diagnosis not present

## 2020-03-19 DIAGNOSIS — Z87891 Personal history of nicotine dependence: Secondary | ICD-10-CM | POA: Insufficient documentation

## 2020-03-19 LAB — CBC
HCT: 34.4 % — ABNORMAL LOW (ref 39.0–52.0)
Hemoglobin: 11.1 g/dL — ABNORMAL LOW (ref 13.0–17.0)
MCH: 28.5 pg (ref 26.0–34.0)
MCHC: 32.3 g/dL (ref 30.0–36.0)
MCV: 88.2 fL (ref 80.0–100.0)
Platelets: 296 10*3/uL (ref 150–400)
RBC: 3.9 MIL/uL — ABNORMAL LOW (ref 4.22–5.81)
RDW: 12.9 % (ref 11.5–15.5)
WBC: 7.8 10*3/uL (ref 4.0–10.5)
nRBC: 0 % (ref 0.0–0.2)

## 2020-03-19 LAB — BASIC METABOLIC PANEL
Anion gap: 8 (ref 5–15)
BUN: 11 mg/dL (ref 8–23)
CO2: 27 mmol/L (ref 22–32)
Calcium: 9 mg/dL (ref 8.9–10.3)
Chloride: 102 mmol/L (ref 98–111)
Creatinine, Ser: 0.97 mg/dL (ref 0.61–1.24)
GFR, Estimated: >60 mL/min (ref >60)
Glucose, Bld: 156 mg/dL — ABNORMAL HIGH (ref 70–99)
Potassium: 4.3 mmol/L (ref 3.5–5.1)
Sodium: 137 mmol/L (ref 135–145)

## 2020-03-19 LAB — TROPONIN I (HIGH SENSITIVITY): Troponin I (High Sensitivity): 16 ng/L (ref <18)

## 2020-03-19 NOTE — Discharge Instructions (Signed)
You were evaluated in the Emergency Department and after careful evaluation, we did not find any emergent condition requiring admission or further testing in the hospital.  Your exam/testing today was overall reassuring.  Your EKG shows some nonspecific findings that do not appear significantly changed from your prior EKGs that we have on record here.  Given your lack of symptoms and your reassuring labs, we do not think you are having an active heart attack at this time.  We recommend that you follow-up with a cardiologist for further preoperative assessment.  This can be done through the [place] or through [facility] health.  Please return to the Emergency Department if you experience any worsening of your condition.  Thank you for allowing [place] to be a part of your care.

## 2020-03-19 NOTE — ED Triage Notes (Signed)
Pt reports went to the [facility] Friday for pre op because he's scheduled to have cataract surgery.  Reports ekg was abnormal and he needed to go to ER.  Denies any chest pain.

## 2020-03-19 NOTE — ED Provider Notes (Signed)
[facility] Emergency Department Provider Note MRN:  [id number]  Arrival date & time: 03/19/20     Chief Complaint   Abnormal ECG   History of Present Illness   Daniel Campos is a 64 y.o. year-old male with a history of hypertension, diabetes presenting to the ED with chief complaint of normal EKG.  Patient had an EKG performed today as a preoperative assessment for cataract surgery.  He was sent here for evaluation for possible heart attack and blood work.  He is not experiencing any symptoms and he feels his normal self.  No recent chest pain or shortness of breath, no abdominal pain, no dizziness diaphoresis, no nausea or vomiting, no shortness of breath.  Review of Systems  A complete 10 system review of systems was obtained and all systems are negative except as noted in the HPI and PMH.   Patient's Health History    Past Medical History:  Diagnosis Date  . Diabetes mellitus without complication ([place])   . Hypertension   . Prostate hypertrophy   . Pulmonary emboli ([place]) 2018    Past Surgical History:  Procedure Laterality Date  . AMPUTATION Left 10/23/2018   Procedure: LEFT AMPUTATION ABOVE KNEE;  Surgeon: Rosetta Posner, MD;  Location: [employer];  Service: Vascular;  Laterality: Left;  . BIOPSY  04/06/2018   Procedure: BIOPSY;  Surgeon: Daneil Dolin, MD;  [facility];  Service: Endoscopy;;  (gastric)  . COLONOSCOPY WITH PROPOFOL N/A 04/06/2018   Procedure: COLONOSCOPY WITH PROPOFOL;  Surgeon: Daneil Dolin, MD;  [facility];  Service: Endoscopy;  Laterality: N/A;  . ESOPHAGOGASTRODUODENOSCOPY (EGD) WITH PROPOFOL N/A 04/06/2018   Procedure: ESOPHAGOGASTRODUODENOSCOPY (EGD) WITH PROPOFOL;  Surgeon: Daneil Dolin, MD;  [facility];  Service: Endoscopy;  Laterality: N/A;  . INTRAMEDULLARY (IM) NAIL INTERTROCHANTERIC Right 12/06/2012   Procedure: INTRAMEDULLARY (IM) NAIL INTERTROCHANTRIC;  Surgeon: Carole Civil, MD;  Location: [facility];  Service: Orthopedics;  Laterality: Right;  after lunch   . KNEE SURGERY    . POLYPECTOMY  04/06/2018   Procedure: POLYPECTOMY;  Surgeon: Daneil Dolin, MD;  [facility];  Service: Endoscopy;;  colon  . SHOULDER SURGERY      Family History  Problem Relation Age of Onset  . Heart attack Father   . Colon polyps Neg Hx   . Colon cancer Neg Hx     Social History   Socioeconomic History  . Marital status: Widowed    Spouse name: Not on file  . Number of children: Not on file  . Years of education: Not on file  . Highest education level: Not on file  Occupational History  . Not on file  Tobacco Use  . Smoking status: Former Smoker    Packs/day: 1.00    Types: E-cigarettes  . Smokeless tobacco: Never Used  Vaping Use  . Vaping Use: Former  Substance and Sexual Activity  . Alcohol use: No  . Drug use: No  . Sexual activity: Not on file  Other Topics Concern  . Not on file  Social History Narrative  . Not on file   Social Determinants of Health   Financial Resource Strain:   . Difficulty of Paying Living Expenses: Not on file  Food Insecurity:   . Worried About Charity fundraiser in the Last Year: Not on file  . Ran Out of Food in the Last Year: Not on file  Transportation  Needs:   . Lack of Transportation (Medical): Not on file  . Lack of Transportation (Non-Medical): Not on file  Physical Activity:   . Days of Exercise per Week: Not on file  . Minutes of Exercise per Session: Not on file  Stress:   . Feeling of Stress : Not on file  Social Connections:   . Frequency of Communication with Friends and Family: Not on file  . Frequency of Social Gatherings with Friends and Family: Not on file  . Attends Religious Services: Not on file  . Active Member of Clubs or Organizations: Not on file  . Attends Archivist Meetings: Not on file  . Marital Status: Not on file  Intimate Partner Violence:   . Fear of Current  or Ex-Partner: Not on file  . Emotionally Abused: Not on file  . Physically Abused: Not on file  . Sexually Abused: Not on file     Physical Exam   Vitals:   03/19/20 1230 03/19/20 1245  BP: (!) 149/83 (!) 150/82  Pulse: 86 88  Resp: 16 20  Temp:  98 F (36.7 C)  SpO2:  100%    CONSTITUTIONAL: Well-appearing, NAD NEURO:  Alert and oriented x 3, no focal deficits EYES:  eyes equal and reactive ENT/NECK:  no LAD, no JVD CARDIO: Regular rate, well-perfused, normal S1 and S2 PULM:  CTAB no wheezing or rhonchi GI/GU:  normal bowel sounds, non-distended, non-tender MSK/SPINE: Left above-the-knee amputation SKIN:  no rash, atraumatic PSYCH:  Appropriate speech and behavior  *Additional and/or pertinent findings included in MDM below  Diagnostic and Interventional Summary    EKG Interpretation  Date/Time:  Wednesday March 19 2020 11:37:00 EDT Ventricular Rate:  101 PR Interval:    QRS Duration: 96 QT Interval:  351 QTC Calculation: 455 R Axis:   84 Text Interpretation: Sinus tachycardia Anterolateral infarct, old No significant change was found Confirmed by Gerlene Fee [phone]) on 03/19/2020 11:50:14 AM      Labs Reviewed  CBC - Abnormal; Notable for the following components:      Result Value   RBC 3.90 (*)    Hemoglobin 11.1 (*)    HCT 34.4 (*)    All other components within normal limits  BASIC METABOLIC PANEL - Abnormal; Notable for the following components:   Glucose, Bld 156 (*)    All other components within normal limits  TROPONIN I (HIGH SENSITIVITY)    No orders to display    Medications - No data to display   Procedures  /  Critical Care Procedures  ED Course and Medical Decision Making  I have reviewed the triage vital signs, the nursing notes, and pertinent available records from the EMR.  Listed above are laboratory and imaging tests that I personally ordered, reviewed, and interpreted and then considered in my medical decision making (see  below for details).  No symptoms, sent here specifically for laboratory assessment in the setting of abnormal EKG.  His EKG does not seem significantly changed to me, unclear if they also did lab tests that were abnormal and that is the reason why he was sent here.  Will repeat basic labs with troponin.    Troponin is negative, appropriate for discharge.   Barth Kirks. Sedonia Small, Elias-Fela Solis [email or web address]  Final Clinical Impressions(s) / ED Diagnoses     ICD-10-CM   1. Abnormal EKG  R94.31     ED Discharge Orders    None  Discharge Instructions Discussed with and Provided to Patient:     Discharge Instructions     You were evaluated in the Emergency Department and after careful evaluation, we did not find any emergent condition requiring admission or further testing in the hospital.  Your exam/testing today was overall reassuring.  Your EKG shows some nonspecific findings that do not appear significantly changed from your prior EKGs that we have on record here.  Given your lack of symptoms and your reassuring labs, we do not think you are having an active heart attack at this time.  We recommend that you follow-up with a cardiologist for further preoperative assessment.  This can be done through the [place] or through [facility] health.  Please return to the Emergency Department if you experience any worsening of your condition.  Thank you for allowing [place] to be a part of your care.        Maudie Flakes, MD 03/19/20 1315

## 2021-03-09 ENCOUNTER — Encounter: Payer: Self-pay | Admitting: *Deleted
